# Patient Record
Sex: Male | Born: 1960
Health system: Southern US, Community
[De-identification: ages and names within clinical notes are randomized; demographics above are authoritative.]

## PROBLEM LIST (undated history)

## (undated) DIAGNOSIS — I10 Essential (primary) hypertension: Secondary | ICD-10-CM

## (undated) DIAGNOSIS — E669 Obesity, unspecified: Secondary | ICD-10-CM

## (undated) DIAGNOSIS — E119 Type 2 diabetes mellitus without complications: Secondary | ICD-10-CM

## (undated) DIAGNOSIS — K259 Gastric ulcer, unspecified as acute or chronic, without hemorrhage or perforation: Secondary | ICD-10-CM

## (undated) DIAGNOSIS — F419 Anxiety disorder, unspecified: Secondary | ICD-10-CM

## (undated) DIAGNOSIS — C801 Malignant (primary) neoplasm, unspecified: Secondary | ICD-10-CM

## (undated) DIAGNOSIS — I219 Acute myocardial infarction, unspecified: Secondary | ICD-10-CM

## (undated) DIAGNOSIS — E785 Hyperlipidemia, unspecified: Secondary | ICD-10-CM

## (undated) DIAGNOSIS — K219 Gastro-esophageal reflux disease without esophagitis: Secondary | ICD-10-CM

## (undated) HISTORY — PX: TONSILECTOMY/ADENOIDECTOMY WITH MYRINGOTOMY: SHX6125

## (undated) HISTORY — PX: JOINT REPLACEMENT: SHX530

## (undated) HISTORY — DX: Acute myocardial infarction, unspecified: I21.9

## (undated) HISTORY — DX: Anxiety disorder, unspecified: F41.9

## (undated) HISTORY — DX: Essential (primary) hypertension: I10

## (undated) HISTORY — DX: Type 2 diabetes mellitus without complications: E11.9

## (undated) HISTORY — DX: Gastric ulcer, unspecified as acute or chronic, without hemorrhage or perforation: K25.9

## (undated) HISTORY — DX: Hyperlipidemia, unspecified: E78.5

## (undated) HISTORY — DX: Obesity, unspecified: E66.9

---

## 2007-11-16 DIAGNOSIS — F419 Anxiety disorder, unspecified: Secondary | ICD-10-CM

## 2007-11-16 HISTORY — DX: Anxiety disorder, unspecified: F41.9

## 2008-11-15 HISTORY — PX: BARIATRIC SURGERY: SHX1103

## 2008-11-24 HISTORY — PX: GASTRIC BYPASS: SHX52

## 2009-11-15 DIAGNOSIS — I219 Acute myocardial infarction, unspecified: Secondary | ICD-10-CM

## 2009-11-15 HISTORY — DX: Acute myocardial infarction, unspecified: I21.9

## 2016-10-16 ENCOUNTER — Encounter: Payer: Self-pay | Admitting: Cardiology

## 2017-03-11 LAB — HM DIABETES EYE EXAM

## 2018-05-29 LAB — LIPID PANEL
Cholesterol: 113 (ref 0–200)
HDL: 25 — AB (ref 35–70)
LDL Cholesterol: 55
Triglycerides: 163 — AB (ref 40–160)

## 2018-05-29 LAB — HEMOGLOBIN A1C: Hemoglobin A1C: 9.9 % — AB (ref 4.0–5.6)

## 2018-08-22 ENCOUNTER — Other Ambulatory Visit: Payer: Self-pay

## 2018-08-22 ENCOUNTER — Emergency Department (HOSPITAL_COMMUNITY): Payer: Medicaid - Out of State

## 2018-08-22 ENCOUNTER — Ambulatory Visit: Payer: Self-pay | Admitting: Physician Assistant

## 2018-08-22 ENCOUNTER — Emergency Department (HOSPITAL_COMMUNITY)
Admission: EM | Admit: 2018-08-22 | Discharge: 2018-08-22 | Disposition: A | Discharge status: Home / Self Care | Payer: Medicaid - Out of State | Attending: Emergency Medicine | Admitting: Emergency Medicine

## 2018-08-22 ENCOUNTER — Encounter (HOSPITAL_COMMUNITY): Payer: Self-pay

## 2018-08-22 ENCOUNTER — Encounter: Payer: Self-pay | Admitting: Physician Assistant

## 2018-08-22 VITALS — BP 158/96 | HR 76 | Temp 98.2°F | Ht 72.0 in | Wt 336.2 lb

## 2018-08-22 DIAGNOSIS — E119 Type 2 diabetes mellitus without complications: Secondary | ICD-10-CM | POA: Insufficient documentation

## 2018-08-22 DIAGNOSIS — I10 Essential (primary) hypertension: Secondary | ICD-10-CM | POA: Insufficient documentation

## 2018-08-22 DIAGNOSIS — Z7984 Long term (current) use of oral hypoglycemic drugs: Secondary | ICD-10-CM | POA: Insufficient documentation

## 2018-08-22 DIAGNOSIS — K219 Gastro-esophageal reflux disease without esophagitis: Secondary | ICD-10-CM | POA: Insufficient documentation

## 2018-08-22 DIAGNOSIS — Z79899 Other long term (current) drug therapy: Secondary | ICD-10-CM | POA: Insufficient documentation

## 2018-08-22 DIAGNOSIS — R1013 Epigastric pain: Secondary | ICD-10-CM | POA: Diagnosis present

## 2018-08-22 DIAGNOSIS — E785 Hyperlipidemia, unspecified: Secondary | ICD-10-CM | POA: Insufficient documentation

## 2018-08-22 DIAGNOSIS — E669 Obesity, unspecified: Secondary | ICD-10-CM | POA: Insufficient documentation

## 2018-08-22 DIAGNOSIS — I252 Old myocardial infarction: Secondary | ICD-10-CM | POA: Insufficient documentation

## 2018-08-22 LAB — I-STAT TROPONIN, ED
TROPONIN I, POC: 0 ng/mL (ref 0.00–0.08)
Troponin i, poc: 0 ng/mL (ref 0.00–0.08)

## 2018-08-22 LAB — HEPATIC FUNCTION PANEL
ALK PHOS: 66 U/L (ref 38–126)
ALT: 13 U/L (ref 0–44)
AST: 15 U/L (ref 15–41)
Albumin: 3.6 g/dL (ref 3.5–5.0)
BILIRUBIN INDIRECT: 0.4 mg/dL (ref 0.3–0.9)
Bilirubin, Direct: 0.2 mg/dL (ref 0.0–0.2)
TOTAL PROTEIN: 7.1 g/dL (ref 6.5–8.1)
Total Bilirubin: 0.6 mg/dL (ref 0.3–1.2)

## 2018-08-22 LAB — BASIC METABOLIC PANEL
ANION GAP: 12 (ref 5–15)
BUN: 11 mg/dL (ref 6–20)
CALCIUM: 9.5 mg/dL (ref 8.9–10.3)
CO2: 26 mmol/L (ref 22–32)
CREATININE: 0.85 mg/dL (ref 0.61–1.24)
Chloride: 98 mmol/L (ref 98–111)
GFR calc non Af Amer: >60 mL/min (ref >60)
Glucose, Bld: 172 mg/dL — ABNORMAL HIGH (ref 70–99)
Potassium: 4.4 mmol/L (ref 3.5–5.1)
SODIUM: 136 mmol/L (ref 135–145)

## 2018-08-22 LAB — CBC
HCT: 47 % (ref 39.0–52.0)
Hemoglobin: 13.2 g/dL (ref 13.0–17.0)
MCH: 22 pg — ABNORMAL LOW (ref 26.0–34.0)
MCHC: 28.1 g/dL — ABNORMAL LOW (ref 30.0–36.0)
MCV: 78.5 fL — ABNORMAL LOW (ref 80.0–100.0)
NRBC: 0 % (ref 0.0–0.2)
PLATELETS: 466 10*3/uL — AB (ref 150–400)
RBC: 5.99 MIL/uL — ABNORMAL HIGH (ref 4.22–5.81)
RDW: 14.8 % (ref 11.5–15.5)
WBC: 8.1 10*3/uL (ref 4.0–10.5)

## 2018-08-22 LAB — LIPASE, BLOOD: LIPASE: 27 U/L (ref 11–51)

## 2018-08-22 MED ORDER — SUCRALFATE 1 G PO TABS: 1.0000 g | ORAL_TABLET | Freq: Three times a day (TID) | ORAL | 0 refills | Status: DC

## 2018-08-22 MED ORDER — IOHEXOL 300 MG/ML  SOLN
100.0000 mL | Freq: Once | INTRAMUSCULAR | Status: AC | PRN
  Administered 2018-08-22: 100 mL via INTRAVENOUS

## 2018-08-22 NOTE — Discharge Instructions (Addendum)
You had a CT scan of your abdomen performed today.  The CT scan showed an irregularity in your bladder that will need to be further evaluated by Urology.  Get rechecked immediately if you have any new or concerning symptoms.    You can take your pantoprazole twice daily for the next week.

## 2018-08-22 NOTE — Patient Instructions (Addendum)
[facility]: [address], [place], [place] [address]

## 2018-08-22 NOTE — ED Provider Notes (Signed)
[facility] EMERGENCY DEPARTMENT Provider Note   CSN: [id number] Arrival date & time: 08/22/18  1547     History   Chief Complaint Chief Complaint  Patient presents with  . Abdominal Pain  . Heartburn    HPI Darrell Sutton is a 57 y.o. male.  The history is provided by the patient. No language interpreter was used.  Abdominal Pain    Heartburn  Associated symptoms include abdominal pain.   Darrell Sutton is a 57 y.o. male who presents to the Emergency Department complaining of abdominal pain. Presents to the emergency department for evaluation of the gastric pain and heartburn that is been ongoing for the last several months. 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 He had lost weight and then regained weight after the procedure. Over the summer he is experienced about 35 pound weight loss and is not been attempting to lose weight. He has decreased oral intake and states that he can only eat as much as he ate just following his surgery. He has increased indigestion described as epigastric burning that radiates to the central chest. Indigestion waxes and wanes with no clear alleviating or worsening factors. He has associated nausea with occasional emesis, last episode a few days ago. No fevers, shortness of breath, diaphoresis, dysuria, diarrhea, hematochezia or melena. He recently moved to the area from [place] and was establishing with a new PCP today, who referred him to the emergency department for further evaluation. Past Medical History:  Diagnosis Date  . Anxiety 2009   started with divorce  . Diabetes mellitus without complication ([place])   . Gastric ulcer    from NSAID overuse  . Hyperlipidemia   . Hypertension   . MI (myocardial infarction) ([place]) 2011   has two stents  . Obesity    highest weight in 500's    Patient Active Problem List   Diagnosis Date Noted  .  Obesity   . Hypertension   . Hyperlipidemia   . Diabetes mellitus without complication ([place])   . Anxiety 11/16/2007    Past Surgical History:  Procedure Laterality Date  . GASTRIC BYPASS  11/24/2008  . JOINT REPLACEMENT     Bilateral knee        Home Medications    Prior to Admission medications   Medication Sig Start Date End Date Taking? Authorizing Provider  amLODipine-benazepril (LOTREL) 10-20 MG capsule Take 1 capsule by mouth daily.    [provider]  atorvastatin (LIPITOR) 20 MG tablet Take 20 mg by mouth daily.    [provider]  glimepiride (AMARYL) 2 MG tablet Take 2 mg by mouth. Take 3 tablets in the AM and 1 tablet in the PM    [provider]  hydrochlorothiazide (HYDRODIURIL) 25 MG tablet Take 25 mg by mouth daily.    [provider]  liraglutide (VICTOZA) 18 MG/3ML SOPN Inject 1.8 mg into the skin daily.    [provider]  metFORMIN (GLUCOPHAGE) 1000 MG tablet Take 1,000 mg by mouth 2 (two) times daily with a meal.    [provider]  metoprolol tartrate (LOPRESSOR) 50 MG tablet Take 75 mg by mouth daily.    [provider]  pantoprazole (PROTONIX) 40 MG tablet Take 40 mg by mouth daily.    [provider]  PARoxetine (PAXIL) 40 MG tablet Take 40 mg by mouth every morning.    [provider]  Family History Family History  Problem Relation Age of Onset  . Hyperlipidemia Mother   . Hypertension Mother   . Hyperlipidemia Father   . Hypertension Father   . Stroke Father   . Diabetes Brother   . Heart attack Brother   . Hyperlipidemia Brother   . Hypertension Brother   . Heart attack Maternal Grandmother   . Heart attack Maternal Grandfather   . Early death Maternal Grandfather   . Esophageal cancer Paternal Grandmother   . Colon cancer Paternal Grandfather     Social History Social History   Tobacco Use  . Smoking status: Never Smoker  . Smokeless tobacco: Never  Used  Substance Use Topics  . Alcohol use: Never    Frequency: Never  . Drug use: Not Currently    Types: Marijuana    Comment: Youth years     Allergies   Penicillins   Review of Systems Review of Systems  Gastrointestinal: Positive for abdominal pain and heartburn.  All other systems reviewed and are negative.    Physical Exam Updated Vital Signs BP (!) 174/91 (BP Location: Right Arm)   Pulse 71   Temp 98.2 F (36.8 C) (Oral)   Resp 16   Ht 6' (1.829 m)   Wt (!) 152.4 kg   SpO2 99%   BMI 45.57 kg/m   Physical Exam  Constitutional: He is oriented to person, place, and time. He appears well-developed and well-nourished.  HENT:  Head: Normocephalic and atraumatic.  Cardiovascular: Normal rate and regular rhythm.  No murmur heard. Pulmonary/Chest: Effort normal and breath sounds normal. No respiratory distress.  Abdominal: Soft. There is no tenderness. There is no rebound and no guarding.  Musculoskeletal: He exhibits no edema or tenderness.  Neurological: He is alert and oriented to person, place, and time.  Skin: Skin is warm and dry.  Psychiatric: He has a normal mood and affect. His behavior is normal.  Nursing note and vitals reviewed.    ED Treatments / Results  Labs (all labs ordered are listed, but only abnormal results are displayed) Labs Reviewed  BASIC METABOLIC PANEL - Abnormal; Notable for the following components:      Result Value   Glucose, Bld 172 (*)    All other components within normal limits  CBC - Abnormal; Notable for the following components:   RBC 5.99 (*)    MCV 78.5 (*)    MCH 22.0 (*)    MCHC 28.1 (*)    Platelets 466 (*)    All other components within normal limits  HEPATIC FUNCTION PANEL  LIPASE, BLOOD  I-STAT TROPONIN, ED  I-STAT TROPONIN, ED    EKG EKG Interpretation  Date/Time:  Tuesday August 22 2018 16:13:31 EDT Ventricular Rate:  79 PR Interval:  166 QRS Duration: 96 QT Interval:  380 QTC  Calculation: 435 R Axis:   55 Text Interpretation:  Normal sinus rhythm Nonspecific T wave abnormality Abnormal ECG no prior available for comparison Confirmed by Quintella Reichert [phone]) on 08/22/2018 6:42:08 PM   Radiology Dg Chest 2 View  Result Date: 08/22/2018 CLINICAL DATA:  Chest pain EXAM: CHEST - 2 VIEW COMPARISON:  None. FINDINGS: Mild right lower lobe airspace disease with blunting of the right costophrenic angle. Probable chronic scarring but no prior studies for confirmation. Left lung clear. Heart size and vascularity normal. IMPRESSION: Pleuroparenchymal density right lung base appears chronic however there are no prior studies for comparison. Otherwise negative Electronically Signed   By: Franchot Gallo  M.D.   On: 08/22/2018 17:34    Procedures Procedures (including critical care time)  Medications Ordered in ED Medications - No data to display   Initial Impression / Assessment and Plan / ED Course  I have reviewed the triage vital signs and the nursing notes.  Pertinent labs & imaging results that were available during my care of the patient were reviewed by me and considered in my medical decision making (see chart for details).     Patient with history of gastric bypass here for evaluation of progressive reflux and epigastric pain. EKG without acute ischemic changes. Patient with no significant pain in the emergency department. CT scan is negative for obstruction. There is a small calcified bladder mass on his CT scan. Discussed with patient findings of imaging and importance of G.I. as well as urology follow-up. Recommend increasing his Protonix, will start Carafate as well. Discussed home care, outpatient follow-up and return precautions.  Presentation is not consistent with ACS, PE, dissection. Final Clinical Impressions(s) / ED Diagnoses   Final diagnoses:  None    ED Discharge Orders    None       Quintella Reichert, MD 08/23/18 0004

## 2018-08-22 NOTE — Progress Notes (Signed)
Darrell Sutton is a 57 y.o. male here for a new problem.  I acted as a Education administrator for [employer], [name] Anselmo Pickler, LPN  History of Present Illness:   Chief Complaint  Patient presents with  . Abdominal Pain  . Heartburn    Abdominal Pain  This is a new problem. Episode onset: Started 2 months ago. The onset quality is gradual. The problem occurs constantly. The problem has been gradually worsening. The pain is located in the epigastric region. The pain is at a severity of 9/10. The quality of the pain is a sensation of fullness and burning. The abdominal pain does not radiate. Associated symptoms include constipation, headaches and vomiting. Pertinent negatives include no dysuria, fever, flatus or hematuria. Nothing aggravates the pain. Relieved by: tums temporarily. He has tried antacids for the symptoms. His past medical history is significant for abdominal surgery. There is no history of colon cancer, gallstones, GERD, irritable bowel syndrome or ulcerative colitis.   Had a cardiologist and endocrinologist in [place]. Has gastroenterologist for family hx of colonoscopy, started at age 7 and has had three per his report, we are requesting records.  Had a stomach ulcer 2011 from NSAID overuse.  Last HgbA1c in the 9's.  Was 500 lb prior to surgery, lowest after surgery low 300's. Has lost about 50-60 lbs over the summer without trying. Has significantly increased satiety. Has had a few episodes of dumping syndrome over the past few weeks. Feels like he is beginning to have issues over the past few months with solids. Feels faint when he walks around after about 10 minutes. Has to stop and sit down, doesn't get SOB or feel like his heart is racing, but feels very dizzy and that he is going to pass out. He states that he has had echo in the past but is was several years ago.   Past Medical History:  Diagnosis Date  . Anxiety 2009   started with divorce  . Diabetes mellitus without  complication ([place])   . Gastric ulcer    from NSAID overuse  . Hyperlipidemia   . Hypertension   . MI (myocardial infarction) ([place]) 2011   has two stents  . Obesity    highest weight in 500's     Social History   Socioeconomic History  . Marital status: Not on file    Spouse name: Not on file  . Number of children: Not on file  . Years of education: Not on file  . Highest education level: Not on file  Occupational History  . Not on file  Social Needs  . Financial resource strain: Not on file  . Food insecurity:    Worry: Not on file    Inability: Not on file  . Transportation needs:    Medical: Not on file    Non-medical: Not on file  Tobacco Use  . Smoking status: Never Smoker  . Smokeless tobacco: Never Used  Substance and Sexual Activity  . Alcohol use: Never    Frequency: Never  . Drug use: Not Currently    Types: Marijuana    Comment: Youth years  . Sexual activity: Not on file  Lifestyle  . Physical activity:    Days per week: Not on file    Minutes per session: Not on file  . Stress: Not on file  Relationships  . Social connections:    Talks on phone: Not on file    Gets together: Not on file  Attends religious service: Not on file    Active member of club or organization: Not on file    Attends meetings of clubs or organizations: Not on file    Relationship status: Not on file  . Intimate partner violence:    Fear of current or ex partner: Not on file    Emotionally abused: Not on file    Physically abused: Not on file    Forced sexual activity: Not on file  Other Topics Concern  . Not on file  Social History Narrative   From [place], moved here Sep 2019 with his wife and son   Son with autism, starting at [employer]       Past Surgical History:  Procedure Laterality Date  . GASTRIC BYPASS  11/24/2008  . JOINT REPLACEMENT     Bilateral knee    Family History  Problem Relation Age of Onset  . Hyperlipidemia Mother   . Hypertension  Mother   . Hyperlipidemia Father   . Hypertension Father   . Stroke Father   . Diabetes Brother   . Heart attack Brother   . Hyperlipidemia Brother   . Hypertension Brother   . Heart attack Maternal Grandmother   . Heart attack Maternal Grandfather   . Early death Maternal Grandfather   . Esophageal cancer Paternal Grandmother   . Colon cancer Paternal Grandfather     Allergies  Allergen Reactions  . Penicillins Hives    Current Medications:   Current Outpatient Medications:  .  amLODipine-benazepril (LOTREL) 10-20 MG capsule, Take 1 capsule by mouth daily., Disp: , Rfl:  .  atorvastatin (LIPITOR) 20 MG tablet, Take 20 mg by mouth daily., Disp: , Rfl:  .  glimepiride (AMARYL) 2 MG tablet, Take 2 mg by mouth. Take 3 tablets in the AM and 1 tablet in the PM, Disp: , Rfl:  .  hydrochlorothiazide (HYDRODIURIL) 25 MG tablet, Take 25 mg by mouth daily., Disp: , Rfl:  .  liraglutide (VICTOZA) 18 MG/3ML SOPN, Inject 1.8 mg into the skin daily., Disp: , Rfl:  .  metFORMIN (GLUCOPHAGE) 1000 MG tablet, Take 1,000 mg by mouth 2 (two) times daily with a meal., Disp: , Rfl:  .  metoprolol tartrate (LOPRESSOR) 50 MG tablet, Take 75 mg by mouth daily., Disp: , Rfl:  .  pantoprazole (PROTONIX) 40 MG tablet, Take 40 mg by mouth daily., Disp: , Rfl:  .  PARoxetine (PAXIL) 40 MG tablet, Take 40 mg by mouth every morning., Disp: , Rfl:    Review of Systems:   Review of Systems  Constitutional: Negative for fever.  Gastrointestinal: Positive for constipation and vomiting. Negative for flatus.  Genitourinary: Negative for dysuria and hematuria.  Neurological: Positive for headaches.    Vitals:   Vitals:   08/22/18 1401  BP: (!) 158/96  Pulse: 76  Temp: 98.2 F (36.8 C)  TempSrc: Oral  SpO2: 97%  Weight: (!) 336 lb 4 oz (152.5 kg)  Height: 6' (1.829 m)     Body mass index is 45.6 kg/m.  Physical Exam:   Physical Exam  Constitutional: He appears well-developed. He is cooperative.   Non-toxic appearance. He does not have a sickly appearance. He does not appear ill. No distress.  Cardiovascular: Normal rate, regular rhythm, S1 normal, S2 normal, normal heart sounds and normal pulses.  No LE edema  Pulmonary/Chest: Effort normal and breath sounds normal.  Abdominal: Normal appearance and bowel sounds are normal. There is no tenderness.  Neurological: He  is alert. GCS eye subscore is 4. GCS verbal subscore is 5. GCS motor subscore is 6.  Skin: Skin is warm, dry and intact.  Psychiatric: He has a normal mood and affect. His speech is normal and behavior is normal.  Nursing note and vitals reviewed.  EKG tracing is personally reviewed.  EKG notes NSR.  No acute changes.   Assessment and Plan:    Taegan was seen today for abdominal pain and heartburn.  Diagnoses and all orders for this visit:  Epigastric pain; Essential hypertension; Hyperlipidemia, unspecified hyperlipidemia type; Diabetes mellitus without complication ([place]) Patient is new to our office today and we have very few records to review. Concerning presentation, and states that his current symptoms are very reminiscent of his last MI, where he only had severe GERD. He also has significant dizziness after ambulating for 10 minutes on a daily basis. Discussed briefly with Dr. Briscoe Deutscher. Given symptoms, uncontrolled DM, HTN, among other risk factors, feel patient would best be served in the ER. Patient and wife agreeable to plan.  . Reviewed expectations re: course of current medical issues. . Discussed self-management of symptoms. . Outlined signs and symptoms indicating need for more acute intervention. . Patient verbalized understanding and all questions were answered. . See orders for this visit as documented in the electronic medical record. . Patient received an After-Visit Summary.  CMA or LPN served as scribe during this visit. History, Physical, and Plan performed by medical provider. The above  documentation has been reviewed and is accurate and complete.  Patient has a HIGH level of medical complexity due to number of diagnoses/treatment options and amount/complexity of data reviewed.   [name], [name]

## 2018-08-22 NOTE — ED Triage Notes (Addendum)
Pt endorses epigastric pain/heart burn x 2 months intermittently with n/v. Pt has hx of MI and 2 stents, and bleeding ulcer. Feels faint with exertion. Just moved to this area. Sent by new pcp. Axox[facility].

## 2018-08-24 ENCOUNTER — Encounter: Payer: Self-pay | Admitting: Physician Assistant

## 2018-08-24 ENCOUNTER — Ambulatory Visit: Payer: Self-pay | Admitting: Physician Assistant

## 2018-08-24 VITALS — BP 130/80 | HR 76 | Temp 98.4°F | Ht 72.0 in | Wt 338.2 lb

## 2018-08-24 DIAGNOSIS — K219 Gastro-esophageal reflux disease without esophagitis: Secondary | ICD-10-CM

## 2018-08-24 DIAGNOSIS — K449 Diaphragmatic hernia without obstruction or gangrene: Secondary | ICD-10-CM

## 2018-08-24 DIAGNOSIS — Z23 Encounter for immunization: Secondary | ICD-10-CM

## 2018-08-24 DIAGNOSIS — I252 Old myocardial infarction: Secondary | ICD-10-CM | POA: Insufficient documentation

## 2018-08-24 DIAGNOSIS — E119 Type 2 diabetes mellitus without complications: Secondary | ICD-10-CM

## 2018-08-24 DIAGNOSIS — N3289 Other specified disorders of bladder: Secondary | ICD-10-CM | POA: Insufficient documentation

## 2018-08-24 DIAGNOSIS — R42 Dizziness and giddiness: Secondary | ICD-10-CM

## 2018-08-24 LAB — POCT GLYCOSYLATED HEMOGLOBIN (HGB A1C): HEMOGLOBIN A1C: 9.5 % — AB (ref 4.0–5.6)

## 2018-08-24 MED ORDER — EMPAGLIFLOZIN 10 MG PO TABS: 10.0000 mg | ORAL_TABLET | Freq: Every day | ORAL | 0 refills | Status: DC

## 2018-08-24 MED ORDER — EMPAGLIFLOZIN 10 MG PO TABS: 10.0000 mg | ORAL_TABLET | Freq: Every day | ORAL | 2 refills | Status: DC

## 2018-08-24 NOTE — Progress Notes (Signed)
Darrell Sutton is a 57 y.o. male here to Establish Care and ED follow up for GERD.  I acted as a Education administrator for [employer], [name] Anselmo Pickler, LPN  History of Present Illness:   Chief Complaint  Patient presents with  . Establish Care  . Follow-up   Went to the ER on 08/22/18 and was diagnosed with GERD. CT scan of abdomen showed hiatal hernia and "probable small calcified mass in bladder." He was started on Carafate and is tolerating well. Feels like his symptoms are "60%" improved. Denies prior history of urological issues, has never seen urologist except for vasectomy.  He does continue to have dizziness and feelings of pre-syncope after walking for about 10 min. Last echo was about 1.5 years ago per his report. States that it was normal? Requesting records today. Has significant hx of prior MI.  Diabetes -- states that his last HgbA1c was in the 9's. Requesting records today. Saw an endocrinologist about 2 years ago and was put on current regimen of: amaryl 6mg  in AM and 2mg  in PM, victoza 1.8 mg daily, metformin 1000 mg BID. Was on Januvia at one point but is no longer on it, doesn't know why he was taken off of it.  Health Maintenance: Immunizations -- will wait till pt gets insurance next month Colonoscopy -- UTD, will get records Bone Density -- N/A Weight -- Weight: (!) 338 lb 4 oz (153.4 kg)   Depression screen PHQ 2/9 08/22/2018  Decreased Interest 0  Down, Depressed, Hopeless 0  PHQ - 2 Score 0    No flowsheet data found.    Past Medical History:  Diagnosis Date  . Anxiety 2009   started with divorce  . Diabetes mellitus without complication ([place])   . Gastric ulcer    from NSAID overuse  . Hyperlipidemia   . Hypertension   . MI (myocardial infarction) ([place]) 2011   has two stents  . Obesity    highest weight in 500's     Social History   Socioeconomic History  . Marital status: Married    Spouse name: Not on file  . Number of children: Not on file   . Years of education: Not on file  . Highest education level: Not on file  Occupational History  . Not on file  Social Needs  . Financial resource strain: Not on file  . Food insecurity:    Worry: Not on file    Inability: Not on file  . Transportation needs:    Medical: Not on file    Non-medical: Not on file  Tobacco Use  . Smoking status: Never Smoker  . Smokeless tobacco: Never Used  Substance and Sexual Activity  . Alcohol use: Never    Frequency: Never  . Drug use: Not Currently    Types: Marijuana    Comment: Youth years  . Sexual activity: Not on file  Lifestyle  . Physical activity:    Days per week: Not on file    Minutes per session: Not on file  . Stress: Not on file  Relationships  . Social connections:    Talks on phone: Not on file    Gets together: Not on file    Attends religious service: Not on file    Active member of club or organization: Not on file    Attends meetings of clubs or organizations: Not on file    Relationship status: Not on file  . Intimate partner violence:  Fear of current or ex partner: Not on file    Emotionally abused: Not on file    Physically abused: Not on file    Forced sexual activity: Not on file  Other Topics Concern  . Not on file  Social History Narrative   From [place], moved here Sep 2019 with his wife and son   Son with autism, starting at [employer]       Past Surgical History:  Procedure Laterality Date  . GASTRIC BYPASS  11/24/2008  . JOINT REPLACEMENT     Bilateral knee    Family History  Problem Relation Age of Onset  . Hyperlipidemia Mother   . Hypertension Mother   . Hyperlipidemia Father   . Hypertension Father   . Stroke Father   . Diabetes Brother   . Heart attack Brother   . Hyperlipidemia Brother   . Hypertension Brother   . Heart attack Maternal Grandmother   . Heart attack Maternal Grandfather   . Early death Maternal Grandfather   . Esophageal cancer Paternal Grandmother   .  Colon cancer Paternal Grandfather     Allergies  Allergen Reactions  . Penicillins Hives    Has patient had a PCN reaction causing immediate rash, facial/tongue/throat swelling, SOB or lightheadedness with hypotension: Yes Has patient had a PCN reaction causing severe rash involving mucus membranes or skin necrosis: No Has patient had a PCN reaction that required hospitalization: No; was already in hosp Has patient had a PCN reaction occurring within the last 10 years: No If all of the above answers are "NO", then may proceed with Cephalosporin use.      Current Medications:   Current Outpatient Medications:  .  albuterol (PROAIR HFA) 108 (90 Base) MCG/ACT inhaler, Inhale 2 puffs into the lungs every 6 (six) hours as needed for wheezing or shortness of breath., Disp: , Rfl:  .  albuterol (PROVENTIL) (2.5 MG/3ML) 0.083% nebulizer solution, Take 2.5 mg by nebulization every 6 (six) hours as needed for wheezing or shortness of breath., Disp: , Rfl:  .  amLODipine-benazepril (LOTREL) 10-20 MG capsule, Take 1 capsule by mouth daily., Disp: , Rfl:  .  aspirin EC 81 MG tablet, Take 81 mg by mouth daily., Disp: , Rfl:  .  atorvastatin (LIPITOR) 20 MG tablet, Take 20 mg by mouth daily., Disp: , Rfl:  .  CALCIUM CITRATE PO, Take 1 tablet by mouth daily., Disp: , Rfl:  .  Cyanocobalamin (VITAMIN B-12 PO), Take 1 tablet by mouth daily., Disp: , Rfl:  .  glimepiride (AMARYL) 2 MG tablet, Take 2-6 mg by mouth See admin instructions. Take 6 mg by mouth in the morning and 2 mg in late evening, Disp: , Rfl:  .  hydrochlorothiazide (HYDRODIURIL) 25 MG tablet, Take 25 mg by mouth daily., Disp: , Rfl:  .  liraglutide (VICTOZA) 18 MG/3ML SOPN, Inject 1.8 mg into the skin daily., Disp: , Rfl:  .  metFORMIN (GLUCOPHAGE) 1000 MG tablet, Take 1,000 mg by mouth 2 (two) times daily with a meal., Disp: , Rfl:  .  metoprolol tartrate (LOPRESSOR) 50 MG tablet, Take 75 mg by mouth daily., Disp: , Rfl:  .  Multiple  Vitamins-Minerals (CENTRUM MEN) TABS, Take 1 tablet by mouth daily., Disp: , Rfl:  .  pantoprazole (PROTONIX) 40 MG tablet, Take 40 mg by mouth daily., Disp: , Rfl:  .  PARoxetine (PAXIL) 40 MG tablet, Take 40 mg by mouth every morning., Disp: , Rfl:  .  sucralfate (CARAFATE) 1 g tablet, Take 1 tablet (1 g total) by mouth 4 (four) times daily -  with meals and at bedtime., Disp: 60 tablet, Rfl: 0 .  empagliflozin (JARDIANCE) 10 MG TABS tablet, Take 10 mg by mouth daily., Disp: 30 tablet, Rfl: 2   Review of Systems:   ROS  Negative unless otherwise specified per HPI.  Vitals:   Vitals:   08/24/18 0958  BP: 130/80  Pulse: 76  Temp: 98.4 F (36.9 C)  TempSrc: Oral  SpO2: 97%  Weight: (!) 338 lb 4 oz (153.4 kg)  Height: 6' (1.829 m)     Body mass index is 45.87 kg/m.  Physical Exam:   Physical Exam  Constitutional: He appears well-developed. He is cooperative.  Non-toxic appearance. He does not have a sickly appearance. He does not appear ill. No distress.  Cardiovascular: Normal rate, regular rhythm, S1 normal, S2 normal, normal heart sounds and normal pulses.  No LE edema  Pulmonary/Chest: Effort normal and breath sounds normal.  Neurological: He is alert. GCS eye subscore is 4. GCS verbal subscore is 5. GCS motor subscore is 6.  Skin: Skin is warm, dry and intact.  Psychiatric: He has a normal mood and affect. His speech is normal and behavior is normal.  Nursing note and vitals reviewed.  Lab Results  Component Value Date   HGBA1C 9.5 (A) 08/24/2018    Assessment and Plan:    Donley was seen today for establish care and follow-up.  Diagnoses and all orders for this visit:  Diabetes mellitus without complication ([place]) [id number] is uncontrolled, 9.5. Continue Victoza, Amaryl, Metformin. Today we will add Jardiance 10 mg tablets. Follow-up in 3 months. He also received glucometer today to hopefully help improve compliance with medications and more accurate assessment of  DM. -     POCT glycosylated hemoglobin (Hb A1C)  Gastroesophageal reflux disease, esophagitis presence not specified; Hiatal hernia Improved, but would like to see GI for further evaluation, especially since he has history of "bleeding ulcer."  Dizziness; History of MI (myocardial infarction) EKG was normal at last visit. Given that he is getting dizzy consistently after 10 minutes of activity, I want him to see cardiology for work-up especially with hx of MI. Do not over exert until cleared by cardiology.  Mass of bladder New. Needs further work-up from urology to r/o neoplasm. Urgent referral placed.  Need for prophylactic vaccination and inoculation against influenza -     Flu Vaccine QUAD 36+ mos IM  Other orders -     empagliflozin (JARDIANCE) 10 MG TABS tablet; Take 10 mg by mouth daily.  . Reviewed expectations re: course of current medical issues. . Discussed self-management of symptoms. . Outlined signs and symptoms indicating need for more acute intervention. . Patient verbalized understanding and all questions were answered. . See orders for this visit as documented in the electronic medical record. . Patient received an After-Visit Summary.   CMA or LPN served as scribe during this visit. History, Physical, and Plan performed by medical provider. The above documentation has been reviewed and is accurate and complete.  [name], [name]

## 2018-08-24 NOTE — Patient Instructions (Signed)
It was great to see you!  Continue current DM regimen but lets start Jardiance 10 mg daily in the meantime. I am going to send this in for you.   Priority right now is getting your referrals completed -- GI, urology and cardiology.  Let's follow-up in 3 months, sooner if you have concerns.  Take care,  [name] [name]

## 2018-08-29 ENCOUNTER — Encounter: Payer: Self-pay | Admitting: Physician Assistant

## 2018-08-30 NOTE — Progress Notes (Addendum)
Cardiology Office Note   Date:  08/31/2018   ID:  Darrell Sutton, DOB 03-10-1961, MRN [id number]  PCP:  [name], PA  Cardiologist:   No primary care provider on file. Referring:  Self  Chief Complaint  Patient presents with  . Coronary Artery Disease      History of Present Illness: Darrell Sutton is a 57 y.o. male who is self referred for evaluation of CAD.  The patient has moved here from [place].  He has a history of coronary artery disease with a stent to his LAD in 1 to his RCA in 2011.  I do see a card indicating a Multi-link stent in the RCA but I don't know about the LAD.  I don't have any records.  He reports having a stress perfusion study about 18 months ago that was normal.  He does not require another catheterization.  He apparently had a heart attack at the time of his presentation in 2011.  He recently was in the emergency room because he had heartburn or chest pain that was felt similar to previous.  His enzymes were negative.  EKG was nonacute.  I have reviewed these records.  He was eventually found to have a hiatal hernia.  He was started on Carafate and his Prilosec was increased.  He is had no new symptoms since then but is only been a few days.  He denies any neck or arm discomfort.  Is not having any palpitations.  His biggest complaint now is dizziness.  This seems to happen sporadically.  He is not feeling palpitations.  He denies any orthostatic symptoms.  He does not see things spinning.  He thinks perhaps his blood sugar might be falling.  Of note he has lost about 30 pounds because he is had problems with his stomach and he now feels that this is reflux.    Past Medical History:  Diagnosis Date  . Anxiety 2009   started with divorce  . Diabetes mellitus without complication ([place])   . Gastric ulcer    from NSAID overuse  . Hyperlipidemia   . Hypertension   . MI (myocardial infarction) ([place]) 2011   has two stents  . Obesity    highest  weight in 500's    Past Surgical History:  Procedure Laterality Date  . GASTRIC BYPASS  11/24/2008  . JOINT REPLACEMENT     Bilateral knee  . TONSILECTOMY/ADENOIDECTOMY WITH MYRINGOTOMY       Current Outpatient Medications  Medication Sig Dispense Refill  . albuterol (PROAIR HFA) 108 (90 Base) MCG/ACT inhaler Inhale 2 puffs into the lungs every 6 (six) hours as needed for wheezing or shortness of breath.    Marland Kitchen albuterol (PROVENTIL) (2.5 MG/3ML) 0.083% nebulizer solution Take 2.5 mg by nebulization every 6 (six) hours as needed for wheezing or shortness of breath.    Marland Kitchen amLODipine-benazepril (LOTREL) 10-20 MG capsule Take 1 capsule by mouth daily.    Marland Kitchen aspirin EC 81 MG tablet Take 81 mg by mouth daily.    Marland Kitchen atorvastatin (LIPITOR) 20 MG tablet Take 20 mg by mouth daily.    Marland Kitchen CALCIUM CITRATE PO Take 1 tablet by mouth daily.    . Cyanocobalamin (VITAMIN B-12 PO) Take 1 tablet by mouth daily.    . empagliflozin (JARDIANCE) 10 MG TABS tablet Take 10 mg by mouth daily. 30 tablet 2  . glimepiride (AMARYL) 2 MG tablet Take 2-6 mg by mouth See admin instructions. Take 6  mg by mouth in the morning and 2 mg in late evening    . hydrochlorothiazide (HYDRODIURIL) 25 MG tablet Take 25 mg by mouth daily.    Marland Kitchen liraglutide (VICTOZA) 18 MG/3ML SOPN Inject 1.8 mg into the skin daily.    . metFORMIN (GLUCOPHAGE) 1000 MG tablet Take 1,000 mg by mouth 2 (two) times daily with a meal.    . metoprolol tartrate (LOPRESSOR) 50 MG tablet Take 75 mg by mouth daily.    . Multiple Vitamins-Minerals (CENTRUM MEN) TABS Take 1 tablet by mouth daily.    . pantoprazole (PROTONIX) 40 MG tablet Take 40 mg by mouth daily.    Marland Kitchen PARoxetine (PAXIL) 40 MG tablet Take 40 mg by mouth every morning.    . sucralfate (CARAFATE) 1 g tablet Take 1 tablet (1 g total) by mouth 4 (four) times daily -  with meals and at bedtime. 60 tablet 0   No current facility-administered medications for this visit.     Allergies:   Penicillins     Social History:  The patient  reports that he has never smoked. He has never used smokeless tobacco. He reports that he has current or past drug history. Drug: Marijuana. He reports that he does not drink alcohol.   Family History:  The patient's family history includes Colon cancer in his paternal grandfather; Diabetes in his brother; Early death in his maternal grandfather; Esophageal cancer in his paternal grandmother; Heart attack in his maternal grandfather and maternal grandmother; Heart attack (age of onset: 68) in his brother; Hyperlipidemia in his brother, father, and mother; Hypertension in his brother, father, and mother; Stroke in his father.    ROS:  Please see the history of present illness.   Otherwise, review of systems are positive for none.   All other systems are reviewed and negative.    PHYSICAL EXAM: VS:  BP 133/77   Pulse 71   Ht 6\' 1"  (1.854 m)   Wt (!) 332 lb (150.6 kg)   SpO2 96%   BMI 43.80 kg/m  , BMI Body mass index is 43.8 kg/m. GENERAL:  Well appearing HEENT:  Pupils equal round and reactive, fundi not visualized, oral mucosa unremarkable NECK:  No jugular venous distention, waveform within normal limits, carotid upstroke brisk and symmetric, no bruits, no thyromegaly LYMPHATICS:  No cervical, inguinal adenopathy LUNGS:  Clear to auscultation bilaterally BACK:  No CVA tenderness CHEST:  Unremarkable HEART:  PMI not displaced or sustained,S1 and S2 within normal limits, no S3, no S4, no clicks, no rubs, no murmurs ABD:  Flat, positive bowel sounds normal in frequency in pitch, no bruits, no rebound, no guarding, no midline pulsatile mass, no hepatomegaly, no splenomegaly EXT:  2 plus pulses throughout, no edema, no cyanosis no clubbing SKIN:  No rashes no nodules NEURO:  Cranial nerves II through XII grossly intact, motor grossly intact throughout PSYCH:  Cognitively intact, oriented to person place and time    EKG:  EKG is not ordered today. The  ekg ordered 08/22/18 demonstrates sinus rhythm, rate 79, axis within normal limits, intervals within normal limits, no acute ST-T wave changes   Recent Labs: 08/22/2018: ALT 13; BUN 11; Creatinine, Ser 0.85; Hemoglobin 13.2; Platelets 466; Potassium 4.4; Sodium 136    Lipid Panel    Component Value Date/Time   CHOL 113 05/29/2018   TRIG 163 (A) 05/29/2018   HDL 25 (A) 05/29/2018   LDLCALC 55 05/29/2018      Wt Readings from Last  3 Encounters:  08/31/18 (!) 332 lb (150.6 kg)  08/24/18 (!) 338 lb 4 oz (153.4 kg)  08/22/18 (!) 336 lb (152.4 kg)      Other studies Reviewed: Additional studies/ records that were reviewed today include: ED records. Review of the above records demonstrates:  Please see elsewhere in the note.     ASSESSMENT AND PLAN:  CAD:   Patient has no symptoms consistent with angina.  I will get his old records.  He reports having a stress perfusion study 18 months ago.  We will continue with risk reduction.  HTN:   The blood pressure is at target. No change in medications is indicated. We will continue with therapeutic lifestyle changes (TLC).  DYSLIPIDEMIA:    LDL is 55.  HDL was low.  He will continue the meds as listed.  DM:  A1C was 9.5.  We talked about this.  He is now following with his primary provider and this should be coming down.  OBESITY: His weight is coming down.  He continues to work on this.  DIZZINESS: The etiology of this is not clear.  However, I do not think is an arrhythmia.  He is not orthostatic.  I do not see a cardiac etiology.  No further cardiac work-up is suggested.  I have asked him to check his blood sugar when he is having these sensations.   Current medicines are reviewed at length with the patient today.  The patient does not have concerns regarding medicines.  The following changes have been made:  no change  Labs/ tests ordered today include: None No orders of the defined types were placed in this  encounter.    Disposition:   FU with me in 4 months.     Signed, [name], MD  08/31/2018 5:40 PM    [facility]    Addendum  I was able to review records from the outside hospital.  In 2011 he had an inferior myocardial infarction.  Cardiac cath demonstrated 90% RCA with thrombus and an incidental 80% stenosis.  He did get PMK.  He underwent thrombectomy and placement of a bare metal stent to the right coronary artery.  He EF was 50%.  He had staged PCI with a Xience stent to the LAD.  Under

## 2018-08-31 ENCOUNTER — Ambulatory Visit (INDEPENDENT_AMBULATORY_CARE_PROVIDER_SITE_OTHER): Payer: Self-pay | Admitting: Cardiology

## 2018-08-31 ENCOUNTER — Encounter: Payer: Self-pay | Admitting: Gastroenterology

## 2018-08-31 ENCOUNTER — Encounter: Payer: Self-pay | Admitting: Cardiology

## 2018-08-31 VITALS — BP 133/77 | HR 71 | Ht 73.0 in | Wt 332.0 lb

## 2018-08-31 DIAGNOSIS — I10 Essential (primary) hypertension: Secondary | ICD-10-CM

## 2018-08-31 DIAGNOSIS — R42 Dizziness and giddiness: Secondary | ICD-10-CM | POA: Insufficient documentation

## 2018-08-31 DIAGNOSIS — E118 Type 2 diabetes mellitus with unspecified complications: Secondary | ICD-10-CM

## 2018-08-31 DIAGNOSIS — I2510 Atherosclerotic heart disease of native coronary artery without angina pectoris: Secondary | ICD-10-CM | POA: Insufficient documentation

## 2018-08-31 NOTE — Patient Instructions (Signed)
Medication Instructions:  Continue current medications  If you need a refill on your cardiac medications before your next appointment, please call your pharmacy.  Labwork: None Ordered   If you have labs (blood work) drawn today and your tests are completely normal, you will receive your results only by: Marland Kitchen MyChart Message (if you have MyChart) OR . A paper copy in the mail If you have any lab test that is abnormal or we need to change your treatment, we will call you to review the results.  Testing/Procedures: None Ordered  Follow-Up: . You will need a follow up appointment in 4 Months.    At [facility], you and your health needs are our priority.  As part of our continuing mission to provide you with exceptional heart care, we have created designated Provider Care Teams.  These Care Teams include your primary Cardiologist (physician) and Advanced Practice Providers (APPs -  Physician Assistants and Nurse Practitioners) who all work together to provide you with the care you need, when you need it.    Thank you for choosing [facility] at [facility]!!

## 2018-09-08 ENCOUNTER — Encounter: Payer: Self-pay | Admitting: Physician Assistant

## 2018-09-11 MED ORDER — ATORVASTATIN CALCIUM 20 MG PO TABS: 20.0000 mg | ORAL_TABLET | Freq: Every day | ORAL | 3 refills | Status: DC

## 2018-09-19 ENCOUNTER — Encounter: Payer: Self-pay | Admitting: Gastroenterology

## 2018-09-19 ENCOUNTER — Ambulatory Visit: Payer: Self-pay | Admitting: Gastroenterology

## 2018-09-19 VITALS — BP 130/90 | HR 80 | Ht 72.0 in | Wt 320.0 lb

## 2018-09-19 DIAGNOSIS — E119 Type 2 diabetes mellitus without complications: Secondary | ICD-10-CM

## 2018-09-19 DIAGNOSIS — R1319 Other dysphagia: Secondary | ICD-10-CM

## 2018-09-19 DIAGNOSIS — K219 Gastro-esophageal reflux disease without esophagitis: Secondary | ICD-10-CM

## 2018-09-19 DIAGNOSIS — R1310 Dysphagia, unspecified: Secondary | ICD-10-CM

## 2018-09-19 DIAGNOSIS — R634 Abnormal weight loss: Secondary | ICD-10-CM

## 2018-09-19 DIAGNOSIS — E669 Obesity, unspecified: Secondary | ICD-10-CM

## 2018-09-19 NOTE — Progress Notes (Signed)
[facility] Consult Note:  History: Darrell Sutton 09/19/2018  Referring physician: Inda Sutton, [place]  Reason for consult/chief complaint: Gastroesophageal Reflux (Severe reflux and heartburn, chest pain, constantly; vomiting 2-3 times daily, gagging on all meals. patient also has been diagnosed with a hiatal hernia.) and Dysphagia (Patient regurgitates food and liquids, paternal grandmother had esophageal cancer)   Subjective  HPI:  This is a 57 year old man referred by primary care and ED physician for several months of steadily worsening upper digestive symptoms.  He reports solid and liquid dysphagia, incessant heartburn, vomiting 2-3 times a day and a nearly 50 pound weight loss in the last few months.  He had previously had intermittent heartburn and dysphagia ever since gastric bypass surgery, but never anything like this and it is all getting steadily worse.  Provider note from 08/22/2018 ED visit was reviewed, when patient was seen there for upper abdominal/chest pain and severe regurgitation with heartburn.  He had been referred there after a new primary care appointment earlier that day out of concern for possible cardiac cause of symptoms. EKG showed no acute ischemic changes by report.  CT scan was negative for bowel obstruction, incidental small calcified bladder mass seen and urology follow-up recommended.  He underwent gastric bypass in 2010.  A year or 2 after that he apparently had an ulcer with bleeding, and said he underwent upper endoscopy with clip placement.  He attributes that ulcer to using NSAIDs at that time prior to his eventual bilateral knee replacements. He and his wife are especially concerned because a history of esophageal cancer in Darrell Sutton's grandmother.  Marya Sutton does not recall ever having been told he had Barrett's esophagus.  ROS:  Review of Systems  Constitutional: Positive for appetite change and unexpected weight change.  HENT:  Negative for mouth sores and voice change.   Eyes: Negative for pain and redness.  Respiratory: Negative for cough and shortness of breath.   Cardiovascular: Negative for chest pain and palpitations.  Genitourinary: Negative for dysuria and hematuria.  Musculoskeletal: Positive for arthralgias. Negative for myalgias.  Skin: Negative for pallor and rash.  Neurological: Negative for weakness and headaches.  Hematological: Negative for adenopathy.     Past Medical History: Past Medical History:  Diagnosis Date  . Anxiety 2009   started with divorce  . Diabetes mellitus without complication ([place])   . Gastric ulcer    from NSAID overuse  . Hyperlipidemia   . Hypertension   . MI (myocardial infarction) ([place]) 2011   has two stents  . Obesity    highest weight in 500's     Past Surgical History: Past Surgical History:  Procedure Laterality Date  . [place] SURGERY  2010  . GASTRIC BYPASS  11/24/2008  . JOINT REPLACEMENT     Bilateral knee  . TONSILECTOMY/ADENOIDECTOMY WITH MYRINGOTOMY       Family History: Family History  Problem Relation Age of Onset  . Hyperlipidemia Mother   . Hypertension Mother   . Hyperlipidemia Father   . Hypertension Father   . Stroke Father   . Diabetes Brother   . Heart attack Brother 13  . Hyperlipidemia Brother   . Hypertension Brother   . Heart attack Maternal Grandmother   . Heart attack Maternal Grandfather   . Early death Maternal Grandfather   . Esophageal cancer Paternal Grandmother   . Colon cancer Paternal Grandfather     Social History: Social History   Socioeconomic History  . Marital status: Married  Spouse name: Not on file  . Number of children: 3  . Years of education: Not on file  . Highest education level: Not on file  Occupational History  . Not on file  Social Needs  . Financial resource strain: Not on file  . Food insecurity:    Worry: Not on file    Inability: Not on file  . Transportation needs:     Medical: Not on file    Non-medical: Not on file  Tobacco Use  . Smoking status: Never Smoker  . Smokeless tobacco: Never Used  Substance and Sexual Activity  . Alcohol use: Never    Frequency: Never  . Drug use: Not Currently    Types: Marijuana    Comment: Youth years  . Sexual activity: Not on file  Lifestyle  . Physical activity:    Days per week: Not on file    Minutes per session: Not on file  . Stress: Not on file  Relationships  . Social connections:    Talks on phone: Not on file    Gets together: Not on file    Attends religious service: Not on file    Active member of club or organization: Not on file    Attends meetings of clubs or organizations: Not on file    Relationship status: Not on file  Other Topics Concern  . Not on file  Social History Narrative   From [place], moved here Sep 2019 with his wife and son   Son with autism, starting at [employer]       Allergies: Allergies  Allergen Reactions  . Penicillins Hives    Has patient had a PCN reaction causing immediate rash, facial/tongue/throat swelling, SOB or lightheadedness with hypotension: Yes Has patient had a PCN reaction causing severe rash involving mucus membranes or skin necrosis: No Has patient had a PCN reaction that required hospitalization: No; was already in hosp Has patient had a PCN reaction occurring within the last 10 years: No If all of the above answers are "NO", then may proceed with Cephalosporin use.    He moved here several months ago from [place], and is planning to start a new job at the end of the week.  Outpatient Meds: Current Outpatient Medications  Medication Sig Dispense Refill  . albuterol (PROAIR HFA) 108 (90 Base) MCG/ACT inhaler Inhale 2 puffs into the lungs every 6 (six) hours as needed for wheezing or shortness of breath.    Marland Kitchen albuterol (PROVENTIL) (2.5 MG/3ML) 0.083% nebulizer solution Take 2.5 mg by nebulization every 6 (six) hours as needed for wheezing or  shortness of breath.    Marland Kitchen amLODipine-benazepril (LOTREL) 10-20 MG capsule Take 1 capsule by mouth daily.    Marland Kitchen aspirin EC 81 MG tablet Take 81 mg by mouth daily.    Marland Kitchen atorvastatin (LIPITOR) 20 MG tablet Take 1 tablet (20 mg total) by mouth daily. 90 tablet 3  . CALCIUM CITRATE PO Take 1 tablet by mouth daily.    . Cyanocobalamin (VITAMIN B-12 PO) Take 1 tablet by mouth daily.    . empagliflozin (JARDIANCE) 10 MG TABS tablet Take 10 mg by mouth daily. 30 tablet 2  . glimepiride (AMARYL) 2 MG tablet Take 2-6 mg by mouth See admin instructions. Take 6 mg by mouth in the morning and 2 mg in late evening    . hydrochlorothiazide (HYDRODIURIL) 25 MG tablet Take 25 mg by mouth daily.    Marland Kitchen liraglutide (VICTOZA) 18 MG/3ML SOPN Inject  1.8 mg into the skin daily.    . metFORMIN (GLUCOPHAGE) 1000 MG tablet Take 1,000 mg by mouth 2 (two) times daily with a meal.    . metoprolol tartrate (LOPRESSOR) 50 MG tablet Take 75 mg by mouth daily.    . Multiple Vitamins-Minerals (CENTRUM MEN) TABS Take 1 tablet by mouth daily.    . pantoprazole (PROTONIX) 40 MG tablet Take 40 mg by mouth daily.    Marland Kitchen PARoxetine (PAXIL) 40 MG tablet Take 40 mg by mouth every morning.    . sucralfate (CARAFATE) 1 g tablet Take 1 tablet (1 g total) by mouth 4 (four) times daily -  with meals and at bedtime. 60 tablet 0   No current facility-administered medications for this visit.       ___________________________________________________________________ Objective   Exam:  BP 130/90   Pulse 80   Ht 6' (1.829 m)   Wt (!) 320 lb (145.2 kg)   BMI 43.40 kg/m    General: this is a(n) well-appearing man, pleasant and conversational, normal vocal quality  Eyes: sclera anicteric, no redness  ENT: oral mucosa moist without lesions, no cervical or supraclavicular lymphadenopathy, fair dentition  CV: RRR without murmur, S1/S2, no JVD, no peripheral edema  Resp: clear to auscultation bilaterally, normal RR and effort noted  GI:  soft, no tenderness, with active bowel sounds. No guarding or palpable organomegaly noted.  Skin; warm and dry, no rash or jaundice noted  Neuro: awake, alert and oriented x 3. Normal gross motor function and fluent speech  Labs:  CBC Latest Ref Rng & Units 08/22/2018  WBC 4.0 - 10.5 K/uL 8.1  Hemoglobin 13.0 - 17.0 g/dL 13.2  Hematocrit 39.0 - 52.0 % 47.0  Platelets 150 - 400 K/uL 466(H)   CMP Latest Ref Rng & Units 08/22/2018  Glucose 70 - 99 mg/dL 172(H)  BUN 6 - 20 mg/dL 11  Creatinine 0.61 - 1.24 mg/dL 0.85  Sodium 135 - 145 mmol/L 136  Potassium 3.5 - 5.1 mmol/L 4.4  Chloride 98 - 111 mmol/L 98  CO2 22 - 32 mmol/L 26  Calcium 8.9 - 10.3 mg/dL 9.5  Total Protein 6.5 - 8.1 g/dL 7.1  Total Bilirubin 0.3 - 1.2 mg/dL 0.6  Alkaline Phos 38 - 126 U/L 66  AST 15 - 41 U/L 15  ALT 0 - 44 U/L 13     Radiologic Studies:  CLINICAL DATA:  Weight loss, vomiting.   EXAM: CT ABDOMEN AND PELVIS WITH CONTRAST   TECHNIQUE: Multidetector CT imaging of the abdomen and pelvis was performed using the standard protocol following bolus administration of intravenous contrast.   CONTRAST:  118mL OMNIPAQUE IOHEXOL 300 MG/ML  SOLN   COMPARISON:  None.   FINDINGS: Lower chest: Probable scarring is noted in posterior portion of right lung base. Small sliding-type hiatal hernia is noted.   Hepatobiliary: No focal liver abnormality is seen. No gallstones, gallbladder wall thickening, or biliary dilatation.   Pancreas: Unremarkable. No pancreatic ductal dilatation or surrounding inflammatory changes.   Spleen: Normal in size without focal abnormality.   Adrenals/Urinary Tract: Adrenal glands appear normal. Right renal cyst is noted. No hydronephrosis or renal obstruction is noted. No renal or ureteral calculi are noted. Possible small calcified mass is seen posteriorly in urinary bladder.   Stomach/Bowel: Status post gastric bypass. Appendix appears normal. No evidence of bowel wall  thickening, distention, or inflammatory changes.   Vascular/Lymphatic: Aortic atherosclerosis. No enlarged abdominal or pelvic lymph nodes.   Reproductive: Prostate  is unremarkable.   Other: No abdominal wall hernia or abnormality. No abdominopelvic ascites.   Musculoskeletal: No acute or significant osseous findings.   IMPRESSION: Small sliding-type hiatal hernia.   Probable small calcified mass seen along posterior wall urinary bladder; cystoscopy is recommended to rule out neoplasm.   Status post gastric bypass.   Aortic Atherosclerosis (ICD10-I70.0).     Electronically Signed   By: Marijo Conception, M.D.   On: 08/22/2018 21:42   Assessment: Encounter Diagnoses  Name Primary?  . Esophageal dysphagia Yes  . Abnormal loss of weight   . Gastroesophageal reflux disease, esophagitis presence not specified   . Diabetes mellitus without complication ([place])   . Obesity, unspecified classification, unspecified obesity type, unspecified whether serious comorbidity present     Severe symptoms steadily worsening, anastomotic stricture seems most likely.  Weight loss certainly raises concern for malignancy.  Plan:  Upper endoscopy with possible dilation tomorrow.  He was agreeable after a thorough discussion of procedure and risks.  The benefits and risks of the planned procedure were described in detail with the patient or (when appropriate) their health care proxy.  Risks were outlined as including, but not limited to, bleeding, infection, perforation, adverse medication reaction leading to cardiac or pulmonary decompensation, or pancreatitis (if ERCP).  The limitation of incomplete mucosal visualization was also discussed.  No guarantees or warranties were given.  Patient at increased risk for cardiopulmonary complications of procedure due to medical comorbidities.  Thank you for the courtesy of this consult.  Please call me with any questions or concerns.  Nelida Meuse  III  CC: Darrell Sutton, [place]

## 2018-09-19 NOTE — Patient Instructions (Signed)
If you are age 57 or older, your body mass index should be between 23-30. Your Body mass index is 43.4 kg/m. If this is out of the aforementioned range listed, please consider follow up with your Primary Care Provider.  If you are age 66 or younger, your body mass index should be between 19-25. Your Body mass index is 43.4 kg/m. If this is out of the aformentioned range listed, please consider follow up with your Primary Care Provider.   You have been scheduled for an endoscopy. Please follow written instructions given to you at your visit today. If you use inhalers (even only as needed), please bring them with you on the day of your procedure. Your physician has requested that you go to www.startemmi.com and enter the access code given to you at your visit today. This web site gives a general overview about your procedure. However, you should still follow specific instructions given to you by our office regarding your preparation for the procedure.  It was a pleasure to see you today!  Dr. [name]

## 2018-09-20 ENCOUNTER — Encounter: Payer: Self-pay | Admitting: Physician Assistant

## 2018-09-20 ENCOUNTER — Ambulatory Visit (AMBULATORY_SURGERY_CENTER): Payer: Self-pay | Admitting: Gastroenterology

## 2018-09-20 ENCOUNTER — Encounter: Payer: Self-pay | Admitting: Gastroenterology

## 2018-09-20 ENCOUNTER — Other Ambulatory Visit: Payer: Self-pay

## 2018-09-20 VITALS — BP 177/89 | HR 81 | Temp 97.1°F | Resp 16 | Ht 72.0 in | Wt 320.0 lb

## 2018-09-20 DIAGNOSIS — K228 Other specified diseases of esophagus: Secondary | ICD-10-CM

## 2018-09-20 DIAGNOSIS — K3189 Other diseases of stomach and duodenum: Secondary | ICD-10-CM

## 2018-09-20 DIAGNOSIS — K2289 Other specified disease of esophagus: Secondary | ICD-10-CM

## 2018-09-20 DIAGNOSIS — R12 Heartburn: Secondary | ICD-10-CM

## 2018-09-20 DIAGNOSIS — R1310 Dysphagia, unspecified: Secondary | ICD-10-CM

## 2018-09-20 DIAGNOSIS — C159 Malignant neoplasm of esophagus, unspecified: Secondary | ICD-10-CM

## 2018-09-20 DIAGNOSIS — R634 Abnormal weight loss: Secondary | ICD-10-CM

## 2018-09-20 MED ORDER — SODIUM CHLORIDE 0.9 % IV SOLN: 500.0000 mL | Freq: Once | INTRAVENOUS | Status: DC

## 2018-09-20 NOTE — Progress Notes (Signed)
Called to room to assist during endoscopic procedure.  Patient ID and intended procedure confirmed with present staff. Received instructions for my participation in the procedure from the performing physician.  

## 2018-09-20 NOTE — Progress Notes (Signed)
PT taken to PACU. Monitors in place. VSS. Report given to RN. 

## 2018-09-20 NOTE — Op Note (Signed)
[facility] Patient Name: Darrell Sutton Procedure Date: 09/20/2018 2:49 PM MRN: [id number] Endoscopist: Mallie Mussel L. [name] , MD Age: 57 Referring MD:  Date of Birth: 10/02/1961 Gender: Male Account #: [id number] Procedure:                Upper GI endoscopy Indications:              Dysphagia, Heartburn, Weight loss Medicines:                Monitored Anesthesia Care Procedure:                Pre-Anesthesia Assessment:                           - Prior to the procedure, a History and Physical                            was performed, and patient medications and                            allergies were reviewed. The patient's tolerance of                            previous anesthesia was also reviewed. The risks                            and benefits of the procedure and the sedation                            options and risks were discussed with the patient.                            All questions were answered, and informed consent                            was obtained. Anticoagulants: The patient has taken                            aspirin. It was decided not to withhold this                            medication prior to the procedure. ASA Grade                            Assessment: III - A patient with severe systemic                            disease. After reviewing the risks and benefits,                            the patient was deemed in satisfactory condition to                            undergo the procedure.  After obtaining informed consent, the endoscope was                            passed under direct vision. Throughout the                            procedure, the patient's blood pressure, pulse, and                            oxygen saturations were monitored continuously. The                            Endoscope was introduced through the mouth, and                            advanced to the jejunum. The upper GI endoscopy  was                            accomplished without difficulty. The patient                            tolerated the procedure well. Scope In: Scope Out: Findings:                 The larynx was normal.                           A large, fungating and ulcerating mass with no                            stigmata of recent bleeding was found in the lower                            third of the esophagus, 37 to 43 cm from the                            incisors. The mass was partially obstructing and                            circumferential. The scope was able to pass with                            mild resistance. Multiple biopsies were taken with                            a cold forceps for histology.                           Evidence of a gastric bypass was found. A gastric                            pouch with a normal size was found. The  gastrojejunal anastomosis was characterized by                            shallow ulceration. This was traversed. The                            pouch-to-jejunum limb was characterized by healthy                            appearing mucosa.                           The examined jejunum was normal. Complications:            No immediate complications. Estimated Blood Loss:     Estimated blood loss was minimal. Impression:               - Normal larynx.                           - Partially obstructing, likely malignant                            esophageal tumor was found in the lower third of                            the esophagus. Biopsied.                           - Gastric bypass with a normal-sized pouch and                            gastrojejunal anastomosis characterized by                            ulceration.                           - Normal examined jejunum. Recommendation:           - Patient has a contact number available for                            emergencies. The signs and symptoms of potential                             delayed complications were discussed with the                            patient. Return to normal activities tomorrow.                            Written discharge instructions were provided to the                            patient.                           -  Soft diet.                           - Continue present medications.                           - Await pathology results. If malignancy confirmed,                            oncology referral. Mallie Mussel L. [name], MD 09/20/2018 3:48:43 PM This report has been signed electronically.

## 2018-09-20 NOTE — Patient Instructions (Signed)
Thank you for allowing [place] to care for you today!  Resume previous medications.  Return to normal activities tomorrow.  Soft diet.  Await pathology results.  Will advise after results are final with  next steps.      YOU HAD AN ENDOSCOPIC PROCEDURE TODAY AT [facility]:   Refer to the procedure report that was given to you for any specific questions about what was found during the examination.  If the procedure report does not answer your questions, please call your gastroenterologist to clarify.  If you requested that your care partner not be given the details of your procedure findings, then the procedure report has been included in a sealed envelope for you to review at your convenience later.  YOU SHOULD EXPECT: Some feelings of bloating in the abdomen. Passage of more gas than usual.  Walking can help get rid of the air that was put into your GI tract during the procedure and reduce the bloating. If you had a lower endoscopy (such as a colonoscopy or flexible sigmoidoscopy) you may notice spotting of blood in your stool or on the toilet paper. If you underwent a bowel prep for your procedure, you may not have a normal bowel movement for a few days.  Please Note:  You might notice some irritation and congestion in your nose or some drainage.  This is from the oxygen used during your procedure.  There is no need for concern and it should clear up in a day or so.  SYMPTOMS TO REPORT IMMEDIATELY:    Following upper endoscopy (EGD)  Vomiting of blood or coffee ground material  New chest pain or pain under the shoulder blades  Painful or persistently difficult swallowing  New shortness of breath  Fever of 100F or higher  Black, tarry-looking stools  For urgent or emergent issues, a gastroenterologist can be reached at any hour by calling [phone].   DIET:  We do recommend a small meal at first, but then you may proceed to your regular diet.  Drink plenty of  fluids but you should avoid alcoholic beverages for 24 hours.  ACTIVITY:  You should plan to take it easy for the rest of today and you should NOT DRIVE or use heavy machinery until tomorrow (because of the sedation medicines used during the test).    FOLLOW UP: Our staff will call the number listed on your records the next business day following your procedure to check on you and address any questions or concerns that you may have regarding the information given to you following your procedure. If we do not reach you, we will leave a message.  However, if you are feeling well and you are not experiencing any problems, there is no need to return our call.  We will assume that you have returned to your regular daily activities without incident.  If any biopsies were taken you will be contacted by phone or by letter within the next 1-3 weeks.  Please call us at [phone] if you have not heard about the biopsies in 3 weeks.    SIGNATURES/CONFIDENTIALITY: You and/or your care partner have signed paperwork which will be entered into your electronic medical record.  These signatures attest to the fact that that the information above on your After Visit Summary has been reviewed and is understood.  Full responsibility of the confidentiality of this discharge information lies with you and/or your care-partner.

## 2018-09-21 ENCOUNTER — Telehealth: Payer: Self-pay | Admitting: Gastroenterology

## 2018-09-21 ENCOUNTER — Telehealth: Payer: Self-pay

## 2018-09-21 ENCOUNTER — Other Ambulatory Visit: Payer: Self-pay

## 2018-09-21 DIAGNOSIS — C159 Malignant neoplasm of esophagus, unspecified: Secondary | ICD-10-CM

## 2018-09-21 DIAGNOSIS — K228 Other specified diseases of esophagus: Secondary | ICD-10-CM

## 2018-09-21 DIAGNOSIS — K2289 Other specified disease of esophagus: Secondary | ICD-10-CM

## 2018-09-21 NOTE — Telephone Encounter (Signed)
Urgent referral made for oncology.

## 2018-09-21 NOTE — Telephone Encounter (Signed)
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  Full pathology report will be coming.  I spoke to Darrell Sutton about this, and he understands the next step is an oncology referral.  I think he needs ASAP evaluation because he is likely to need chemotherapy and radiation in the near future due to his severity of dysphagia and weight loss.  I am not currently ordering an imaging study for staging, since it has been my experience lately that oncology seems to prefer a standard CT scan in some cases, but PET scan another cases.  Since I do not want Darrell Sutton to have redundant tests, I would prefer the oncologists see him and decide what staging work-up to do next.  Darrell Sutton understands that if that imaging is still inconclusive, he might need endoscopic ultrasound.  I will copy this to [facility], the cancer coordinator, to hopefully expedite Darrell Sutton's evaluation.

## 2018-09-21 NOTE — Telephone Encounter (Signed)
  Follow up Call-  Call back number 09/20/2018  Post procedure Call Back phone  # [phone]  Permission to leave phone message Yes     Patient questions:  Do you have a fever, pain , or abdominal swelling? No. Pain Score  0 *  Have you tolerated food without any problems? Yes.    Have you been able to return to your normal activities? Yes.   "as normal as its been" Do you have any questions about your discharge instructions: Diet   No. Medications  No. Follow up visit  No.  Do you have questions or concerns about your Care? No.  Actions: * If pain score is 4 or above: No action needed, pain <4.

## 2018-09-22 ENCOUNTER — Encounter: Payer: Self-pay | Admitting: Hematology

## 2018-09-22 ENCOUNTER — Telehealth: Payer: Self-pay | Admitting: Hematology

## 2018-09-22 ENCOUNTER — Other Ambulatory Visit: Payer: Self-pay | Admitting: Hematology

## 2018-09-22 ENCOUNTER — Other Ambulatory Visit: Payer: Self-pay

## 2018-09-22 DIAGNOSIS — C155 Malignant neoplasm of lower third of esophagus: Secondary | ICD-10-CM

## 2018-09-22 NOTE — Progress Notes (Signed)
  Oncology Nurse Navigator Documentation  PET scan scheduled for 09/29/18. Nutrition appointment 09/28/18 with Dory Peru after initial consult with [name]. Called patient to introduce myself and explain role of GI navigator. Patient has my contact information. Plan to meet patient at initial consult. Will follow as needed. No barriers to tx identified at this time.

## 2018-09-22 NOTE — Telephone Encounter (Signed)
Received a new referral from Dr. [name] for esophageal mass. Pt has been scheduled to see [name] on 11/14 at 230pm. I cld and left the pt a vm with the appt date and time. Letter mailed.

## 2018-09-25 NOTE — Progress Notes (Signed)
GI Location of Tumor / Histology: Malignant neoplasm of lower third of esophagus  Darrell Sutton presented with several months of steadily worsening upper digestive symptoms.  He reports solid and liquid dysphagia, incessant heartburn, vomiting 2-3 times a day and nearly 50 pounds of weight loss in the last few months.  PET 09/29/2018  Upper endoscopy 09/20/2018: Normal larynx, partially obstructing,likely malignant esophageal tumor was found in the lower third of the esophagus.  Biopsies of   Past/Anticipated interventions by Gastroenterology, if any: Dr. [name] 09/21/2018 - Upper endoscopy with possible dilation 09/20/2018. 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 -Next step is oncology referral. -I think he needs an evaluation ASAP because he is likely to need chemotherapy and radiation in the near future due to his severity of dysphagia and weight loss. - If the imaging studies are still inconclusive, he might need endoscopic ultrasound.  Past/Anticipated interventions by surgeon, if any:   Past/Anticipated interventions by medical oncology, if any:  [name] 09/28/2018 2:30 pm  Weight changes, if any: Down about 2 pounds since last week.  Food having a hard time going down.  Bowel/Bladder complaints, if any: No  Nausea / Vomiting, if any: Frequently, gagging a lot. Dry heaving in the middle of the night, aspiration sometimes.  Pain issues, if any:  3/10     BP (!) 167/87 (BP Location: Left Arm, Patient Position: Sitting)   Pulse 84   Temp 98.4 F (36.9 C) (Oral)   Resp 18   Ht 6' (1.829 m)   Wt (!) 318 lb 2 oz (144.3 kg)   SpO2 97%   BMI 43.15 kg/m    Wt Readings from Last 3 Encounters:  09/26/18 (!) 318 lb 2 oz (144.3 kg)  09/20/18 (!) 320 lb (145.2 kg)  09/19/18 (!) 320 lb (145.2 kg)   SAFETY ISSUES:  Prior radiation? No  Pacemaker/ICD? No, has some stents  Possible current pregnancy? No  Is the patient on methotrexate? No  Current  Complaints/Details: -Was told from the ER scans there is a mass in the bladder.  Follow up with urologist.

## 2018-09-26 ENCOUNTER — Encounter: Payer: Self-pay | Admitting: Physician Assistant

## 2018-09-26 ENCOUNTER — Other Ambulatory Visit: Payer: Self-pay

## 2018-09-26 ENCOUNTER — Encounter: Payer: Self-pay | Admitting: Radiation Oncology

## 2018-09-26 ENCOUNTER — Ambulatory Visit
Admission: RE | Admit: 2018-09-26 | Discharge: 2018-09-26 | Disposition: A | Discharge status: Home / Self Care | Payer: Medicaid - Out of State | Source: Ambulatory Visit | Attending: Radiation Oncology | Admitting: Radiation Oncology

## 2018-09-26 VITALS — BP 167/87 | HR 84 | Temp 98.4°F | Resp 18 | Ht 72.0 in | Wt 318.1 lb

## 2018-09-26 DIAGNOSIS — Z880 Allergy status to penicillin: Secondary | ICD-10-CM | POA: Diagnosis not present

## 2018-09-26 DIAGNOSIS — N329 Bladder disorder, unspecified: Secondary | ICD-10-CM | POA: Diagnosis not present

## 2018-09-26 DIAGNOSIS — Z79899 Other long term (current) drug therapy: Secondary | ICD-10-CM | POA: Insufficient documentation

## 2018-09-26 DIAGNOSIS — I10 Essential (primary) hypertension: Secondary | ICD-10-CM | POA: Insufficient documentation

## 2018-09-26 DIAGNOSIS — Z7984 Long term (current) use of oral hypoglycemic drugs: Secondary | ICD-10-CM | POA: Diagnosis not present

## 2018-09-26 DIAGNOSIS — C155 Malignant neoplasm of lower third of esophagus: Secondary | ICD-10-CM | POA: Diagnosis present

## 2018-09-26 DIAGNOSIS — Z7982 Long term (current) use of aspirin: Secondary | ICD-10-CM | POA: Diagnosis not present

## 2018-09-26 DIAGNOSIS — Z9884 Bariatric surgery status: Secondary | ICD-10-CM | POA: Diagnosis not present

## 2018-09-26 DIAGNOSIS — E119 Type 2 diabetes mellitus without complications: Secondary | ICD-10-CM | POA: Diagnosis not present

## 2018-09-26 NOTE — Progress Notes (Signed)
Radiation Oncology         [phone] ________________________________  Name: Darrell Sutton        MRN: [id number]  Date of Service: 09/26/2018 DOB: 22-Jul-1961  CC:[name], PA  [place], [place], [place]     REFERRING PHYSICIAN: [name], [place]   DIAGNOSIS: The primary encounter diagnosis was Malignant neoplasm of lower third of esophagus ([place]). A diagnosis of Cancer of lower third of esophagus (HCC) was also pertinent to this visit.   HISTORY OF PRESENT ILLNESS: Darrell Sutton is a 57 y.o. male seen at the request of Dr. [name] for a newly diagnosed esophagus cancer.  The patient has had prior bariatric surgery in 2010 by Dr. Elisabeth Cara in [place] in [place].  He reports that he believes it was a Roux-en-Y procedure.  He was successful in weight loss, and appears to have lost about 200 pounds.  He has lost about 50 pounds in the last 6 months and has had progressive dysphasia, which led to evaluation in the ED and a CT abdomen pelvis revealed a sliding type heiatal hernia, and a calcified bladder mass. He proceeded to evaluation with Dr. [name].  On 09/20/2018 he underwent an endoscopic assessment which revealed a large fungating and ulcerating mass with in the lower third of the esophagus partially obstructing and circumferential.  Mild resistance was noted against the scope, and evidence of gastric bypass was found with a gastric pouch normal in size and JG anastomosis well characterized by shallow ulceration, this was traversed, the pouch to jejunum limb was characterized by healthy-appearing mucosa, and the exam examined jejunum was normal.  A biopsy revealed adenocarcinoma in the setting of Barrett's esophagus. He is scheduled for PET imaging this Friday and to see [name]. He comes today to discuss treatment recommendations for his cancer. He is also awaiting on evaluation at [facility].     PREVIOUS RADIATION THERAPY: [place]   PAST MEDICAL HISTORY:  Past  Medical History:  Diagnosis Date  . Anxiety 2009   started with divorce  . Diabetes mellitus without complication ([place])   . Gastric ulcer    from NSAID overuse  . Hyperlipidemia   . Hypertension   . MI (myocardial infarction) ([place]) 2011   has two stents  . Obesity    highest weight in 500's       PAST SURGICAL HISTORY: Past Surgical History:  Procedure Laterality Date  . [place] SURGERY  2010  . GASTRIC BYPASS  11/24/2008  . JOINT REPLACEMENT     Bilateral knee  . TONSILECTOMY/ADENOIDECTOMY WITH MYRINGOTOMY       FAMILY HISTORY:  Family History  Problem Relation Age of Onset  . Hyperlipidemia Mother   . Hypertension Mother   . Hyperlipidemia Father   . Hypertension Father   . Stroke Father   . Diabetes Brother   . Heart attack Brother 31  . Hyperlipidemia Brother   . Hypertension Brother   . Heart attack Maternal Grandmother   . Heart attack Maternal Grandfather   . Early death Maternal Grandfather   . Esophageal cancer Paternal Grandmother   . Colon cancer Paternal Grandfather   . Stomach cancer Neg Hx      SOCIAL HISTORY:  reports that he has never smoked. He has never used smokeless tobacco. He reports that he has current or past drug history. Drug: Marijuana. He reports that he does not drink alcohol.   ALLERGIES: Penicillins   MEDICATIONS:  Current Outpatient Medications  Medication Sig Dispense Refill  . albuterol (PROAIR HFA) 108 (90 Base) MCG/ACT inhaler Inhale 2 puffs into the lungs every 6 (six) hours as needed for wheezing or shortness of breath.    Marland Kitchen albuterol (PROVENTIL) (2.5 MG/3ML) 0.083% nebulizer solution Take 2.5 mg by nebulization every 6 (six) hours as needed for wheezing or shortness of breath.    Marland Kitchen amLODipine-benazepril (LOTREL) 10-20 MG capsule Take 1 capsule by mouth daily.    Marland Kitchen aspirin EC 81 MG tablet Take 81 mg by mouth daily.    Marland Kitchen atorvastatin (LIPITOR) 20 MG tablet Take 1 tablet (20 mg total) by mouth daily. 90 tablet 3  .  CALCIUM CITRATE PO Take 1 tablet by mouth daily.    . Cyanocobalamin (VITAMIN B-12 PO) Take 1 tablet by mouth daily.    . empagliflozin (JARDIANCE) 10 MG TABS tablet Take 10 mg by mouth daily. 30 tablet 2  . glimepiride (AMARYL) 2 MG tablet Take 2-6 mg by mouth See admin instructions. Take 6 mg by mouth in the morning and 2 mg in late evening    . hydrochlorothiazide (HYDRODIURIL) 25 MG tablet Take 25 mg by mouth daily.    Marland Kitchen liraglutide (VICTOZA) 18 MG/3ML SOPN Inject 1.8 mg into the skin daily.    . metFORMIN (GLUCOPHAGE) 1000 MG tablet Take 1,000 mg by mouth 2 (two) times daily with a meal.    . metoprolol tartrate (LOPRESSOR) 50 MG tablet Take 75 mg by mouth daily.    . Multiple Vitamins-Minerals (CENTRUM MEN) TABS Take 1 tablet by mouth daily.    . pantoprazole (PROTONIX) 40 MG tablet Take 40 mg by mouth daily.    Marland Kitchen PARoxetine (PAXIL) 40 MG tablet Take 40 mg by mouth every morning.    . sucralfate (CARAFATE) 1 g tablet Take 1 tablet (1 g total) by mouth 4 (four) times daily -  with meals and at bedtime. 60 tablet 0   Current Facility-Administered Medications  Medication Dose Route Frequency Provider Last Rate Last Dose  . 0.9 %  sodium chloride infusion  500 mL Intravenous Once Doran Stabler, MD         REVIEW OF SYSTEMS: On review of systems, the patient reports that he is doing okay overall. He is having pain with swallowing and is using carafate. He had been swallowing the pill whole and was counseled on crushing it and making a slurry with water today. He also describes difficulty with indigestion, poor intake, and difficulty with thicker textured foods. He denies any chest pain, shortness of breath, cough, fevers, chills, night sweats. He admits to about 50 pounds of weight loss in the last 6 months. He denies any bowel or bladder disturbances, and denies abdominal pain, nausea or vomiting. He denies any new musculoskeletal or joint aches or pains. A complete review of systems is  obtained and is otherwise negative.     PHYSICAL EXAM:  Wt Readings from Last 3 Encounters:  09/26/18 (!) 318 lb 2 oz (144.3 kg)  09/20/18 (!) 320 lb (145.2 kg)  09/19/18 (!) 320 lb (145.2 kg)   Temp Readings from Last 3 Encounters:  09/26/18 98.4 F (36.9 C) (Oral)  09/20/18 (!) 97.1 F (36.2 C)  08/24/18 98.4 F (36.9 C) (Oral)   BP Readings from Last 3 Encounters:  09/26/18 (!) 167/87  09/20/18 (!) 177/89  09/19/18 130/90   Pulse Readings from Last 3 Encounters:  09/26/18 84  09/20/18 81  09/19/18 80   Pain Assessment Pain  Score: 3  Pain Frequency: Intermittent Pain Loc: Chest(hard to swallow, feels like something stuck.)/10  In general this is a well appearing caucasian male in [place] acute distress. He's alert and oriented x4 and appropriate throughout the examination. Cardiopulmonary assessment is negative for acute distress and he exhibits normal effort.    ECOG = 1  0 - Asymptomatic (Fully active, able to carry on all predisease activities without restriction)  1 - Symptomatic but completely ambulatory (Restricted in physically strenuous activity but ambulatory and able to carry out work of a light or sedentary nature. For example, light housework, office work)  2 - Symptomatic, <50% in bed during the day (Ambulatory and capable of all self care but unable to carry out any work activities. Up and about more than 50% of waking hours)  3 - Symptomatic, >50% in bed, but not bedbound (Capable of only limited self-care, confined to bed or chair 50% or more of waking hours)  4 - Bedbound (Completely disabled. Cannot carry on any self-care. Totally confined to bed or chair)  5 - Death   Eustace Pen MM, Creech RH, Tormey DC, et al. [phone]). "Toxicity and response criteria of the [facility]". [place] Oncol. 5 (6): 649-55    LABORATORY DATA:  Lab Results  Component Value Date   WBC 8.1 08/22/2018   HGB 13.2 08/22/2018   HCT 47.0 08/22/2018    MCV 78.5 (L) 08/22/2018   PLT 466 (H) 08/22/2018   Lab Results  Component Value Date   NA 136 08/22/2018   K 4.4 08/22/2018   CL 98 08/22/2018   CO2 26 08/22/2018   Lab Results  Component Value Date   ALT 13 08/22/2018   AST 15 08/22/2018   ALKPHOS 66 08/22/2018   BILITOT 0.6 08/22/2018      RADIOGRAPHY: [place] results found.     IMPRESSION/PLAN:. 1. Adenocarcinoma of the distal esophagus. Dr. Lisbeth Renshaw discusses the pathology findings and reviews the nature of esophageal cancer.  He agrees with continuing the work-up which includes pet imaging.  Patient is scheduled for this on Friday.  We will follow-up with these results when available.  Is difficult given the fact that he has not had full imaging of this area to know whether he would be a candidate for surgery alone versus radiation and chemo.  We anticipate concurrent neoadjuvant treatment, and will copy our notes to Dr. Servando Snare.  We discussed the risks, benefits, short, and long term effects of radiotherapy. Dr. Lisbeth Renshaw discusses the delivery and logistics of radiotherapy and anticipates a course of 5 1/2 weeks of radiotherapy to the esophagus and any regional lymph nodes involved.  We will hold off on signing consent as the plan has not been finalized.  He is in agreement.  We will also discuss his case in GI conference. 2. Prior gastric bypass.  We are going to obtain the prior records from the patient's surgical procedure, and forward them on to Dr. Servando Snare.  His prior surgery will likely impact whether he is a candidate for esophagectomy. 3. Bladder mass. The patient is also being worked up currently for this and will see 1 of the physicians at [facility] urology.  I will reach out to the GU navigator to determine who the patient will be seeing  In a visit lasting 60 minutes, greater than 50% of the time was spent face to face discussing his case, and coordinating the patient's care.   The above documentation reflects my direct findings  during this shared patient visit. Please see the separate note by Dr. Lisbeth Renshaw on this date for the remainder of the patient's plan of care.    Carola Rhine, PAC

## 2018-09-26 NOTE — Telephone Encounter (Signed)
Darrell Sutton, pt still has not heard from Urology. Referral placed 10/10. Pt has a bladder mass and needs to be seen ASAP.

## 2018-09-27 ENCOUNTER — Encounter: Payer: Self-pay | Admitting: *Deleted

## 2018-09-27 NOTE — Progress Notes (Signed)
Darrell Sutton  Holiday representative received referral from radiation oncology for financial and insurance concerns.  CSW contacted patient at home to offer support and assess for needs.  CSW spoke with patient and patients wife.  Patient stated he and his family recently moved from [place], where he had Medicaid.  Due to recent diagnosis patient and his wife are unable to [place] or find new employment.  CSW encouraged patient to contact DSS to discuss applying for Medicaid for himself and his 21 year old son.  CSW and patient also discussed applying for Social Security Disability once the staging process was complete.  CSW referred patient to the financial advocate to discuss applying for the [place] and exploring additional resources.  CSW provided contact information and encouraged patient to call with questions or concerns.    Darrell [place], MSW, LCSW, OSW-C Clinical Social Worker [facility] [phone]

## 2018-09-27 NOTE — Progress Notes (Signed)
[place] Psychosocial Distress Screening Clinical Social Work  Clinical Social Work was referred by distress screening protocol.  The patient scored a 9 on the Psychosocial Distress Thermometer which indicates severe distress. Clinical Social Worker contacted patient by phone to assess for distress and other psychosocial needs.     See previous CSW note.  ONCBCN DISTRESS SCREENING 09/26/2018  Screening Type Initial Screening  Distress experienced in past week (1-10) 9  Practical problem type Insurance  Emotional problem type Nervousness/Anxiety;Adjusting to illness  Spiritual/Religous concerns type Facing my mortality  Physical Problem type Nausea/vomiting;Sleep/insomnia  Other [phone], [phone]    Johnnye Lana, MSW, LCSW, OSW-C Clinical Social Worker [place] [phone]

## 2018-09-28 ENCOUNTER — Inpatient Hospital Stay: Payer: Medicaid - Out of State | Attending: Hematology | Admitting: Nutrition

## 2018-09-28 ENCOUNTER — Ambulatory Visit: Payer: Self-pay | Admitting: Hematology

## 2018-09-28 DIAGNOSIS — Z7982 Long term (current) use of aspirin: Secondary | ICD-10-CM | POA: Insufficient documentation

## 2018-09-28 DIAGNOSIS — Z9884 Bariatric surgery status: Secondary | ICD-10-CM | POA: Insufficient documentation

## 2018-09-28 DIAGNOSIS — C155 Malignant neoplasm of lower third of esophagus: Secondary | ICD-10-CM | POA: Insufficient documentation

## 2018-09-28 DIAGNOSIS — Z79899 Other long term (current) drug therapy: Secondary | ICD-10-CM | POA: Insufficient documentation

## 2018-09-28 DIAGNOSIS — E119 Type 2 diabetes mellitus without complications: Secondary | ICD-10-CM | POA: Insufficient documentation

## 2018-09-28 DIAGNOSIS — Z800 Family history of malignant neoplasm of digestive organs: Secondary | ICD-10-CM | POA: Insufficient documentation

## 2018-09-28 DIAGNOSIS — Z7984 Long term (current) use of oral hypoglycemic drugs: Secondary | ICD-10-CM | POA: Insufficient documentation

## 2018-09-28 DIAGNOSIS — I10 Essential (primary) hypertension: Secondary | ICD-10-CM | POA: Insufficient documentation

## 2018-09-28 NOTE — Progress Notes (Signed)
57 year old male diagnosed with esophageal cancer.  He is a patient of [name] and Dr. Lisbeth Renshaw.  Past medical history includes Roux-en-Y gastric bypass in 2010, obesity, MI, hypertension, hyperlipidemia, gastric ulcer, diabetes, and anxiety.  Medications include Lipitor, vitamin B12, Jardiance, Amaryl, Victoza, Glucophage, multivitamin, Protonix, Carafate, and calcium.  Labs include HG A1c of 9.5.  Height: 6 feet 0 inches. Weight: 317.4 pounds. Usual body weight: 360 pounds in the early summer per patient. BMI: 43.05.  Patient reports he began losing weight in the early part of the summer. Reports he has lost approximately 50 pounds over the last 3 months. He started to experience early satiety and heartburn. He is vomiting several times a day as a result of solid and liquid dysphasia. Patient reports he can tolerate [place] granola bars, yogurt, broth-based soups, whey protein shakes with fruit, Glucerna, and grits. Reports he has been compliant with taking his vitamin and mineral supplements status post Roux-en-Y.  Nutrition diagnosis:  Severe malnutrition in the context of chronic illness related to esophageal cancer and dysphasia as evidenced by 12% weight loss over 6 months, less than 75% energy intake for greater than 1 month, severe depletion of muscle mass and body fat.  Intervention: Educated patient to consume smaller more frequent meals and snacks incorporating high-protein foods. Encourage soft moist proteins as well as adding gravies and sauces as tolerated. Encouraged fluids as tolerated between meals. Recommended patient consume 2-3 protein shakes daily. Provided samples. Recommended 2 chewable adult multivitamins, 1500 mg calcium citrate and 350-500 mcg of vitamin B12 daily. Questions were answered.  Teach back method used.  Contact information provided.  Monitoring, evaluation, goals: Patient will tolerate high protein, high calorie foods to minimize further  weight loss.  Next visit: To be scheduled with treatment weekly.  **Disclaimer: This note was dictated with voice recognition software. Similar sounding words can inadvertently be transcribed and this note may contain transcription errors which may not have been corrected upon publication of note.**

## 2018-09-29 ENCOUNTER — Inpatient Hospital Stay (HOSPITAL_BASED_OUTPATIENT_CLINIC_OR_DEPARTMENT_OTHER): Payer: Medicaid - Out of State | Admitting: Hematology

## 2018-09-29 ENCOUNTER — Ambulatory Visit (HOSPITAL_COMMUNITY)
Admission: RE | Admit: 2018-09-29 | Discharge: 2018-09-29 | Disposition: A | Discharge status: Home / Self Care | Payer: Medicaid - Out of State | Source: Ambulatory Visit | Attending: Hematology | Admitting: Hematology

## 2018-09-29 ENCOUNTER — Encounter: Payer: Self-pay | Admitting: Hematology

## 2018-09-29 VITALS — BP 180/99 | HR 95 | Temp 98.3°F | Resp 20 | Ht 72.0 in | Wt 317.4 lb

## 2018-09-29 DIAGNOSIS — Z9884 Bariatric surgery status: Secondary | ICD-10-CM | POA: Diagnosis not present

## 2018-09-29 DIAGNOSIS — Z7982 Long term (current) use of aspirin: Secondary | ICD-10-CM

## 2018-09-29 DIAGNOSIS — Z79899 Other long term (current) drug therapy: Secondary | ICD-10-CM

## 2018-09-29 DIAGNOSIS — I10 Essential (primary) hypertension: Secondary | ICD-10-CM | POA: Diagnosis not present

## 2018-09-29 DIAGNOSIS — E119 Type 2 diabetes mellitus without complications: Secondary | ICD-10-CM | POA: Diagnosis not present

## 2018-09-29 DIAGNOSIS — Z7984 Long term (current) use of oral hypoglycemic drugs: Secondary | ICD-10-CM | POA: Diagnosis not present

## 2018-09-29 DIAGNOSIS — C155 Malignant neoplasm of lower third of esophagus: Secondary | ICD-10-CM | POA: Diagnosis not present

## 2018-09-29 DIAGNOSIS — I2510 Atherosclerotic heart disease of native coronary artery without angina pectoris: Secondary | ICD-10-CM

## 2018-09-29 DIAGNOSIS — F419 Anxiety disorder, unspecified: Secondary | ICD-10-CM

## 2018-09-29 DIAGNOSIS — Z800 Family history of malignant neoplasm of digestive organs: Secondary | ICD-10-CM

## 2018-09-29 LAB — GLUCOSE, CAPILLARY: Glucose-Capillary: 217 mg/dL — ABNORMAL HIGH (ref 70–99)

## 2018-09-29 MED ORDER — FLUDEOXYGLUCOSE F - 18 (FDG) INJECTION
16.4000 | Freq: Once | INTRAVENOUS | Status: AC
  Administered 2018-09-29: 16.4 via INTRAVENOUS

## 2018-09-29 NOTE — Progress Notes (Signed)
[facility]  [phone]) [phone] Fax:([phone]) [place] Note   Patient Care Team: [name], [place] as PCP - General (Physician Assistant)   Date of Service:  09/29/2018  REFERRAL PHYSICIAN: GI Dr. [name]  CHIEF COMPLAINTS/PURPOSE OF CONSULTATION:  Newly Diagnosed Esophogeal Cancer    Cancer of lower third of esophagus ([place])   09/20/2018 Procedure    Upper Endoscopy by Dr. [name] 09/20/18  IMPRESSION - Normal larynx. - Partially obstructing, likely malignant esophageal tumor was found in the lower third of the esophagus. Biopsied. - Gastric bypass with a normal-sized pouch and gastrojejunal anastomosis characterized by ulceration. - Normal examined jejunum.    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 male is a here because of newly diagnosed Esophogeal cancer. The patient was referred by Dr. [name]. The patient presents to the clinic today accompanied by his wife.   He presented to his PCP Dr. Morene Rankins who referred him to Dr. [name] who did work up with CT and PET scan along with Endoscopy. He lost 60 pounds in the last 6 months. He notes he was getting full faster. He denied having dysphagia initially  Today the patient notes he eats mostly soft foods and granola bars. Foods like yogurt and water is not very palatable for him. For foods or liquids not palatable he will throw up due to his gag reflex. He notes he will get lightheaded and dizzy after walking. He denies nausea outside of laying down for long periods of time. He notes burning feeling when swallowing frequently and even without eating. He denies bloating, abdominal  cramping, constipation, headaches, or leg swelling. He does have a cough due to aspirating in the last few weeks. He will use Nebulizer as needed. He notes he energy is low and impacts his activity level. He is able to complete ADLs and can go outside as needed.  He notes if he needs a port, He would prefer a port.   Socially he recently moved to [place] from [place] in summer of 2019. He is married with 3 biologist children and a teenage stepson with ADHD he takes care of. He used to work in Therapist, art. His wife stopped looking for jobs to help him. They do not currently have insurance, but is interested. He notes he smoke Marijuana in the past and stopped in the 1990s.    They have a PMHx of gastric bypass surgery in 2010 for weight loss. That did work for him but he gained back most of that weight over the years. He had a MI in 2011. He has HTN and plans to get home monitor to check at home. He has DM and notes having 130s range of preprandial readings. He is on Paxil for anxiety which helps him. He notes his mental stress is a 6/10 currently.  He notes his paternal grandmother passed away from Quinn cancer when she was in her in her 72s. He notes colon cancer runs in his father's side. His father had non-hodgkin's lymphoma. He started having colonoscopy at 23 and had benign polyp removed in the past.  REVIEW OF SYSTEMS:   Constitutional: Denies fevers, chills or abnormal night sweats (+) weight loss  Eyes: Denies blurriness of vision, double vision or watery eyes Ears, nose, mouth, throat, and face: Denies mucositis or sore throat (+) moderate dysphagia  Respiratory: Denies cough, dyspnea or wheezes (+) SOB upon exertion (+) occasional aspiration Cardiovascular: Denies palpitation, chest discomfort or lower extremity swelling Gastrointestinal:  Denies nausea, heartburn or change in bowel habits Skin: Denies abnormal skin rashes Lymphatics: Denies new lymphadenopathy or easy  bruising Neurological:Denies numbness, tingling or new weaknesses Behavioral/Psych: Mood is stable, no new changes (+) anxious  All other systems were reviewed with the patient and are negative.    MEDICAL HISTORY:  Past Medical History:  Diagnosis Date  . Anxiety 2009   started with divorce  . Diabetes mellitus without complication ([place])   . Gastric ulcer    from NSAID overuse  . Hyperlipidemia   . Hypertension   . MI (myocardial infarction) ([place]) 2011   has two stents  . Obesity    highest weight in 500's    SURGICAL HISTORY: Past Surgical History:  Procedure Laterality Date  . [place] SURGERY  2010  . GASTRIC BYPASS  11/24/2008  . JOINT REPLACEMENT     Bilateral knee  . TONSILECTOMY/ADENOIDECTOMY WITH MYRINGOTOMY      SOCIAL HISTORY: Social History   Socioeconomic History  . Marital status: Married    Spouse name: Not on file  . Number of children: 3  . Years of education: Not on file  . Highest education level: Not on file  Occupational History  . Not on file  Social Needs  . Financial resource strain: Not on file  . Food insecurity:    Worry: Not on file    Inability: Not on file  . Transportation needs:    Medical: No    Non-medical: No  Tobacco Use  . Smoking status: Never Smoker  . Smokeless tobacco: Never Used  Substance and Sexual Activity  . Alcohol use: Never    Frequency: Never  . Drug use: Not Currently    Types: Marijuana    Comment: Youth years  . Sexual activity: Not on file  Lifestyle  . Physical activity:    Days per week: Not on file    Minutes per session: Not on file  . Stress: Not on file  Relationships  . Social connections:    Talks on phone: Not on file    Gets together: Not on file    Attends religious service: Not on file    Active member of club or organization: Not on file    Attends meetings of clubs or organizations: Not on file    Relationship status: Not on file  . Intimate partner violence:    Fear of  current or ex partner: Not on file    Emotionally abused: Not on file    Physically abused: Not on file    Forced sexual activity: Not on file  Other Topics Concern  . Not on file  Social History Narrative   From [place], moved here Sep 2019 with his wife and son   Son with autism, starting at [employer] HISTORY: Family History  Problem Relation Age of Onset  . Hyperlipidemia Mother   . Hypertension Mother   . Hyperlipidemia Father   . Hypertension Father   . Stroke Father   . Cancer Father        NHL  .  Diabetes Brother   . Heart attack Brother 76  . Hyperlipidemia Brother   . Hypertension Brother   . Heart attack Maternal Grandmother   . Heart attack Maternal Grandfather   . Early death Maternal Grandfather   . Esophageal cancer Paternal Grandmother   . Colon cancer Paternal Grandfather   . Colon cancer Cousin   . Stomach cancer Neg Hx     ALLERGIES:  is allergic to penicillins.  MEDICATIONS:  Current Outpatient Medications  Medication Sig Dispense Refill  . albuterol (PROAIR HFA) 108 (90 Base) MCG/ACT inhaler Inhale 2 puffs into the lungs every 6 (six) hours as needed for wheezing or shortness of breath.    Marland Kitchen albuterol (PROVENTIL) (2.5 MG/3ML) 0.083% nebulizer solution Take 2.5 mg by nebulization every 6 (six) hours as needed for wheezing or shortness of breath.    Marland Kitchen amLODipine-benazepril (LOTREL) 10-20 MG capsule Take 1 capsule by mouth daily.    Marland Kitchen aspirin EC 81 MG tablet Take 81 mg by mouth daily.    Marland Kitchen atorvastatin (LIPITOR) 20 MG tablet Take 1 tablet (20 mg total) by mouth daily. 90 tablet 3  . CALCIUM CITRATE PO Take 1 tablet by mouth daily.    . Cyanocobalamin (VITAMIN B-12 PO) Take 1 tablet by mouth daily.    . empagliflozin (JARDIANCE) 10 MG TABS tablet Take 10 mg by mouth daily. 30 tablet 2  . glimepiride (AMARYL) 2 MG tablet Take 2-6 mg by mouth See admin instructions. Take 6 mg by mouth in the morning and 2 mg in late evening    .  hydrochlorothiazide (HYDRODIURIL) 25 MG tablet Take 25 mg by mouth daily.    Marland Kitchen liraglutide (VICTOZA) 18 MG/3ML SOPN Inject 1.8 mg into the skin daily.    . metFORMIN (GLUCOPHAGE) 1000 MG tablet Take 1,000 mg by mouth 2 (two) times daily with a meal.    . metoprolol tartrate (LOPRESSOR) 50 MG tablet Take 75 mg by mouth daily.    . Multiple Vitamins-Minerals (CENTRUM MEN) TABS Take 1 tablet by mouth daily.    . pantoprazole (PROTONIX) 40 MG tablet Take 40 mg by mouth daily.    Marland Kitchen PARoxetine (PAXIL) 40 MG tablet Take 40 mg by mouth every morning.    . sucralfate (CARAFATE) 1 g tablet Take 1 tablet (1 g total) by mouth 4 (four) times daily -  with meals and at bedtime. 60 tablet 0   Current Facility-Administered Medications  Medication Dose Route Frequency Provider Last Rate Last Dose  . 0.9 %  sodium chloride infusion  500 mL Intravenous Once Nelida Meuse III, MD        PHYSICAL EXAMINATION: ECOG PERFORMANCE STATUS: 2 - Symptomatic, <50% confined to bed  Vitals:   09/29/18 1535 09/29/18 1544  BP: (!) 184/100 (!) 180/99  Pulse: 95   Resp: 20   Temp: 98.3 F (36.8 C)   SpO2: 98%    Filed Weights   09/29/18 1535  Weight: (!) 317 lb 6.4 oz (144 kg)    GENERAL:alert, no distress and comfortable SKIN: skin color, texture, turgor are normal, no rashes or significant lesions EYES: normal, conjunctiva are pink and non-injected, sclera clear OROPHARYNX:no exudate, no erythema and lips, buccal mucosa, and tongue normal  NECK: supple, thyroid normal size, non-tender, without nodularity LYMPH:  no palpable lymphadenopathy in the cervical, axillary or inguinal LUNGS: clear to auscultation and percussion with normal breathing effort HEART: regular rate & rhythm and no murmurs and no lower extremity edema ABDOMEN:abdomen soft,  non-tender and normal bowel sounds Musculoskeletal:no cyanosis of digits and no clubbing  PSYCH: alert & oriented x 3 with fluent speech NEURO: no focal motor/sensory  deficits  LABORATORY DATA:  I have reviewed the data as listed CBC Latest Ref Rng & Units 08/22/2018  WBC 4.0 - 10.5 K/uL 8.1  Hemoglobin 13.0 - 17.0 g/dL 13.2  Hematocrit 39.0 - 52.0 % 47.0  Platelets 150 - 400 K/uL 466(H)    CMP Latest Ref Rng & Units 08/22/2018  Glucose 70 - 99 mg/dL 172(H)  BUN 6 - 20 mg/dL 11  Creatinine 0.61 - 1.24 mg/dL 0.85  Sodium 135 - 145 mmol/L 136  Potassium 3.5 - 5.1 mmol/L 4.4  Chloride 98 - 111 mmol/L 98  CO2 22 - 32 mmol/L 26  Calcium 8.9 - 10.3 mg/dL 9.5  Total Protein 6.5 - 8.1 g/dL 7.1  Total Bilirubin 0.3 - 1.2 mg/dL 0.6  Alkaline Phos 38 - 126 U/L 66  AST 15 - 41 U/L 15  ALT 0 - 44 U/L 13     PATHOLOGY  Diagnosis 09/20/18  Esophagus, biopsy, mass - 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 Dr. Jeannie Done reviewed the case and agrees with the above diagnosis. Dr. [name] was notified of these results on September 21, 2018.   PROCEDURES   Upper Endoscopy by Dr. [name] 09/20/18  IMPRESSION - Normal larynx. - Partially obstructing, likely malignant esophageal tumor was found in the lower third of the esophagus. Biopsied. - Gastric bypass with a normal-sized pouch and gastrojejunal anastomosis characterized by ulceration. - Normal examined jejunum.   RADIOGRAPHIC STUDIES: I have personally reviewed the radiological images as listed and agreed with the findings in the report. No results found.    PET 09/29/18  PENDING    ASSESSMENT & PLAN:  Darrell Sutton is a 57 y.o. Caucasian male with a history of Anxiety, DM, HLD, HTN, obesity, MI and gastric Bypass surgery.    1. Esophogeal cancer, likely early stage -I reviewed his EGD findings, CT and PET scans images and pathology report in great detail with patient and his wife.  EGD showed a partially obstructing mass in the lower third  esophagus, biopsy showed adenocarcinoma of the esophagus. -He had a PET scan today which has not been read yet. Based on my view of the scan there does not appear to be distant metastasis, or significant hypermetabolic regional adenopathy. Will wait for the PET scan report.  -If PET is negative for node metastasis, I recommend EUS with Dr. [name] for tumor staging. Based on the EGD finding, he likely has locally advanced disease.  -I discussed standard care for locally advanced esophageal cancer (T3/4 or node positive), is neoadjuvant concurrent ChemoRT followed by hepatectomy. He has met with Dr. Lisbeth Renshaw already. He will consult with Dr. Servando Snare about surgery. -I discussed the small chance of cure with chemoradiation alone, surgery.  Given his young age, if he is a candidate for surgery, I encouraged him to consider surgery to increase chance of cure.  We discussed the aggressiveness of esophageal cancer, and high risk of recurrence after surgery. -I discussed the chemotherapy option, if he is a candidate for surgery, all of her weekly carboplatin AUC 2 and Taxol 50 mg/m for 5-6 cycles with concurrent radiation. -due to his dysphagia, regurgitation, nausea, partially obstructive esophageal tumor,, and weight loss, he will likely need J Tube before chemmoRT to  get adequate nutrition. He understands and is open to the J-tube placement. I will discuss with DRs. Moody and [employer].    2. Dysphagia, Vomiting w/o nausea, Weight loss secondary to #1 -He has a Bypass surgery in 2010 and gained most of that with back  -In the past 6 months he loss 60 pounds -He is not able to keep most foods down.  -He has met with Warnell Forester, and is on Glucerna    3. Possible aspirations, secondary to #1 -On albuterol inhaler as needed    4. Genetic testing -He has a family history of GI cancer in his paternal family and is eligible for testing. He is interested.  -I will refer him when he establishes insurance or  financial assistance.   5. HTN, DM -Managed by Dr. Morene Rankins. On amlodipine, Glimepiride, Viztoza Metformin and Jardiance.  -08/2018 his Hb A1c was 9.5.  -BP at 180/99 today (09/29/18) but normal usually when on medication -I discussed on treatment he will likely get steroid premedication. This will increase his glucose. If so, I may start him on insulin. He understands.   6. Financial Support -He and his wife just moved to [place] and are currently not working and do not have insurance -He has met with our financial office to set him up with aid. He is planning applying medicaid and disability     PLAN:  -will review the PET findings and discuss J-tube placement with Drs. Moody and [employer]. If node negative on PET, will send him back to Dr. [name] for EUS staging  -will determine ChemoRT based on his final staging.  -Send Genetic Referral    No orders of the defined types were placed in this encounter.   All questions were answered. The patient knows to call the clinic with any problems, questions or concerns. I spent 55 minutes counseling the patient face to face. The total time spent in the appointment was 60 minutes and more than 50% was on counseling.     Truitt Merle, MD 09/29/2018   I, Joslyn Devon, am acting as scribe for Truitt Merle, MD.   I have reviewed the above documentation for accuracy and completeness, and I agree with the above.

## 2018-09-29 NOTE — Progress Notes (Signed)
Met with Undrea Shipes and his wife. Explained role of nurse navigator. Contact names and phone numbers were provided for entire [facility] team.    Patient recently moved to [facility] from [place]. Patient nor his wife are employed. Once results from PET return will contact social work for assistance with this need. Will continue to follow as needed

## 2018-10-02 ENCOUNTER — Other Ambulatory Visit: Payer: Self-pay | Admitting: Hematology

## 2018-10-02 DIAGNOSIS — C155 Malignant neoplasm of lower third of esophagus: Secondary | ICD-10-CM

## 2018-10-03 ENCOUNTER — Encounter: Payer: Self-pay | Admitting: Hematology

## 2018-10-04 ENCOUNTER — Other Ambulatory Visit: Payer: Self-pay

## 2018-10-04 DIAGNOSIS — C155 Malignant neoplasm of lower third of esophagus: Secondary | ICD-10-CM

## 2018-10-04 NOTE — Progress Notes (Signed)
Tumor board 10/04/18: BX supraclavicular lymph node by IR. If lymph node negative, EUS for staging then neoadjuvant chemotherapy. Referral to Dr. Johney Maine for feeding tube.

## 2018-10-10 ENCOUNTER — Other Ambulatory Visit: Payer: Self-pay | Admitting: Radiology

## 2018-10-11 ENCOUNTER — Other Ambulatory Visit: Payer: Self-pay

## 2018-10-11 ENCOUNTER — Encounter (HOSPITAL_COMMUNITY): Payer: Self-pay

## 2018-10-11 ENCOUNTER — Ambulatory Visit (HOSPITAL_COMMUNITY)
Admission: RE | Admit: 2018-10-11 | Discharge: 2018-10-11 | Disposition: A | Discharge status: Home / Self Care | Payer: Medicaid - Out of State | Source: Ambulatory Visit | Attending: Hematology | Admitting: Hematology

## 2018-10-11 DIAGNOSIS — E669 Obesity, unspecified: Secondary | ICD-10-CM | POA: Insufficient documentation

## 2018-10-11 DIAGNOSIS — Z807 Family history of other malignant neoplasms of lymphoid, hematopoietic and related tissues: Secondary | ICD-10-CM | POA: Insufficient documentation

## 2018-10-11 DIAGNOSIS — E119 Type 2 diabetes mellitus without complications: Secondary | ICD-10-CM | POA: Insufficient documentation

## 2018-10-11 DIAGNOSIS — E785 Hyperlipidemia, unspecified: Secondary | ICD-10-CM | POA: Insufficient documentation

## 2018-10-11 DIAGNOSIS — Z96653 Presence of artificial knee joint, bilateral: Secondary | ICD-10-CM | POA: Diagnosis not present

## 2018-10-11 DIAGNOSIS — Z6841 Body Mass Index (BMI) 40.0 and over, adult: Secondary | ICD-10-CM | POA: Diagnosis not present

## 2018-10-11 DIAGNOSIS — Z880 Allergy status to penicillin: Secondary | ICD-10-CM | POA: Diagnosis not present

## 2018-10-11 DIAGNOSIS — C770 Secondary and unspecified malignant neoplasm of lymph nodes of head, face and neck: Secondary | ICD-10-CM | POA: Diagnosis not present

## 2018-10-11 DIAGNOSIS — I10 Essential (primary) hypertension: Secondary | ICD-10-CM | POA: Diagnosis not present

## 2018-10-11 DIAGNOSIS — Z9884 Bariatric surgery status: Secondary | ICD-10-CM | POA: Insufficient documentation

## 2018-10-11 DIAGNOSIS — Z79899 Other long term (current) drug therapy: Secondary | ICD-10-CM | POA: Insufficient documentation

## 2018-10-11 DIAGNOSIS — Z8711 Personal history of peptic ulcer disease: Secondary | ICD-10-CM | POA: Insufficient documentation

## 2018-10-11 DIAGNOSIS — Z955 Presence of coronary angioplasty implant and graft: Secondary | ICD-10-CM | POA: Insufficient documentation

## 2018-10-11 DIAGNOSIS — R591 Generalized enlarged lymph nodes: Secondary | ICD-10-CM | POA: Diagnosis present

## 2018-10-11 DIAGNOSIS — Z8249 Family history of ischemic heart disease and other diseases of the circulatory system: Secondary | ICD-10-CM | POA: Diagnosis not present

## 2018-10-11 DIAGNOSIS — F419 Anxiety disorder, unspecified: Secondary | ICD-10-CM | POA: Insufficient documentation

## 2018-10-11 DIAGNOSIS — Z833 Family history of diabetes mellitus: Secondary | ICD-10-CM | POA: Insufficient documentation

## 2018-10-11 DIAGNOSIS — Z823 Family history of stroke: Secondary | ICD-10-CM | POA: Insufficient documentation

## 2018-10-11 DIAGNOSIS — Z7982 Long term (current) use of aspirin: Secondary | ICD-10-CM | POA: Insufficient documentation

## 2018-10-11 DIAGNOSIS — I252 Old myocardial infarction: Secondary | ICD-10-CM | POA: Diagnosis not present

## 2018-10-11 DIAGNOSIS — C155 Malignant neoplasm of lower third of esophagus: Secondary | ICD-10-CM | POA: Diagnosis not present

## 2018-10-11 DIAGNOSIS — Z7984 Long term (current) use of oral hypoglycemic drugs: Secondary | ICD-10-CM | POA: Diagnosis not present

## 2018-10-11 HISTORY — PX: OTHER SURGICAL HISTORY: SHX169

## 2018-10-11 LAB — CBC
HEMATOCRIT: 46.8 % (ref 39.0–52.0)
HEMOGLOBIN: 13.6 g/dL (ref 13.0–17.0)
MCH: 22.4 pg — AB (ref 26.0–34.0)
MCHC: 29.1 g/dL — AB (ref 30.0–36.0)
MCV: 77.2 fL — AB (ref 80.0–100.0)
Platelets: 394 10*3/uL (ref 150–400)
RBC: 6.06 MIL/uL — AB (ref 4.22–5.81)
RDW: 14.2 % (ref 11.5–15.5)
WBC: 7.2 10*3/uL (ref 4.0–10.5)
nRBC: 0 % (ref 0.0–0.2)

## 2018-10-11 LAB — PROTIME-INR
INR: 1.08
Prothrombin Time: 13.9 seconds (ref 11.4–15.2)

## 2018-10-11 LAB — GLUCOSE, CAPILLARY: Glucose-Capillary: 204 mg/dL — ABNORMAL HIGH (ref 70–99)

## 2018-10-11 MED ORDER — FENTANYL CITRATE (PF) 100 MCG/2ML IJ SOLN
INTRAMUSCULAR | Status: AC | PRN
  Administered 2018-10-11: 25 ug via INTRAVENOUS
  Administered 2018-10-11: 50 ug via INTRAVENOUS

## 2018-10-11 MED ORDER — MIDAZOLAM HCL 2 MG/2ML IJ SOLN
INTRAMUSCULAR | Status: AC | PRN
  Administered 2018-10-11 (×2): 1 mg via INTRAVENOUS

## 2018-10-11 MED ORDER — HYDROCODONE-ACETAMINOPHEN 5-325 MG PO TABS: 1.0000 | ORAL_TABLET | ORAL | Status: DC | PRN

## 2018-10-11 MED ORDER — LIDOCAINE HCL (PF) 1 % IJ SOLN
INTRAMUSCULAR | Status: AC
  Filled 2018-10-11: qty 30

## 2018-10-11 MED ORDER — SODIUM CHLORIDE 0.9 % IV SOLN: INTRAVENOUS | Status: DC

## 2018-10-11 MED ORDER — FENTANYL CITRATE (PF) 100 MCG/2ML IJ SOLN
INTRAMUSCULAR | Status: AC
  Filled 2018-10-11: qty 2

## 2018-10-11 MED ORDER — MIDAZOLAM HCL 2 MG/2ML IJ SOLN
INTRAMUSCULAR | Status: AC
  Filled 2018-10-11: qty 2

## 2018-10-11 NOTE — Procedures (Signed)
US guided core biopsy of left supraclavicular lymph nodes. 7 cores performed and 4 adequate specimens obtained. Minimal blood loss and no immediate complication.

## 2018-10-11 NOTE — Discharge Instructions (Addendum)
Needle Biopsy, Care After °Refer to this sheet in the next few weeks. These instructions provide you with information about caring for yourself after your procedure. Your health care provider may also give you more specific instructions. Your treatment has been planned according to current medical practices, but problems sometimes occur. Call your health care provider if you have any problems or questions after your procedure. °What can I expect after the procedure? °After your procedure, it is common to have soreness, bruising, or mild pain at the biopsy site. This should go away in a few days. °Follow these instructions at home: °· Rest as directed by your health care provider. °· Take medicines only as directed by your health care provider. °· There are many different ways to close and cover the biopsy site, including stitches (sutures), skin glue, and adhesive strips. Follow your health care provider's instructions about: °? Biopsy site care. °? Bandage (dressing) changes and removal. °? Biopsy site closure removal. °· Check your biopsy site every day for signs of infection. Watch for: °? Redness, swelling, or pain. °? Fluid, blood, or pus. °Contact a health care provider if: °· You have a fever. °· You have redness, swelling, or pain at the biopsy site that lasts longer than a few days. °· You have fluid, blood, or pus coming from the biopsy site. °· You feel nauseous. °· You vomit. °Get help right away if: °· You have shortness of breath. °· You have trouble breathing. °· You have chest pain. °· You feel dizzy or you faint. °· You have bleeding that does not stop with pressure or a bandage. °· You cough up blood. °· You have pain in your abdomen. °This information is not intended to replace advice given to you by your health care provider. Make sure you discuss any questions you have with your health care provider. °Document Released: 03/18/2015 Document Revised: 04/08/2016 Document Reviewed:  10/28/2014 °Elsevier Interactive Patient Education © 2018 [employer] °Moderate Conscious Sedation, Adult, Care After °These instructions provide you with information about caring for yourself after your procedure. Your health care provider may also give you more specific instructions. Your treatment has been planned according to current medical practices, but problems sometimes occur. Call your health care provider if you have any problems or questions after your procedure. °What can I expect after the procedure? °After your procedure, it is common: °· To feel sleepy for several hours. °· To feel clumsy and have poor balance for several hours. °· To have poor judgment for several hours. °· To vomit if you eat too soon. ° °Follow these instructions at home: °For at least 24 hours after the procedure: ° °· Do not: °? Participate in activities where you could fall or become injured. °? Drive. °? Use heavy machinery. °? Drink alcohol. °? Take sleeping pills or medicines that cause drowsiness. °? Make important decisions or sign legal documents. °? Take care of children on your own. °· Rest. °Eating and drinking °· Follow the diet recommended by your health care provider. °· If you vomit: °? Drink water, juice, or soup when you can drink without vomiting. °? Make sure you have little or no nausea before eating solid foods. °General instructions °· Have a responsible adult stay with you until you are awake and alert. °· Take over-the-counter and prescription medicines only as told by your health care provider. °· If you smoke, do not smoke without supervision. °· Keep all follow-up visits as told by your health care   provider. This is important. °Contact a health care provider if: °· You keep feeling nauseous or you keep vomiting. °· You feel light-headed. °· You develop a rash. °· You have a fever. °Get help right away if: °· You have trouble breathing. °This information is not intended to replace advice given to you  by your health care provider. Make sure you discuss any questions you have with your health care provider. °Document Released: 08/22/2013 Document Revised: 04/05/2016 Document Reviewed: 02/21/2016 °Elsevier Interactive Patient Education © 2018 [employer] ° °

## 2018-10-11 NOTE — H&P (Signed)
Chief Complaint: Patient was seen in consultation today for left supraclavicular lymph node biopsy at the request of Feng,Yan  Referring Physician(s): Feng,Yan  Supervising Physician: Markus Daft  Patient Status: [facility]  History of Present Illness: Darrell Sutton is a 57 y.o. male   Newly diagnosed esophageal cancer 09/20/18 Dr Burr Medico requesting biopsy of SCLN-- staging  PET 09/29/18: IMPRESSION: 1. Focal hypermetabolism in the distal esophagus is consistent with the patient's known malignancy. 2. Areas of ill-defined upper normal to mildly enlarged lymph nodes identified in the left supraclavicular region, left thoracic inlet, upper abdomen, and para-aortic retroperitoneal space. These areas also show stranding around the lymph nodes. Low level FDG accumulation in these lymph nodes is borderline for infectious versus neoplastic etiology. While metastatic disease is certainly a consideration, infectious/inflammatory etiology is also possible and lymphoma cannot be excluded.    Past Medical History:  Diagnosis Date  . Anxiety 2009   started with divorce  . Diabetes mellitus without complication ([place])   . Gastric ulcer    from NSAID overuse  . Hyperlipidemia   . Hypertension   . MI (myocardial infarction) ([place]) 2011   has two stents  . Obesity    highest weight in 500's    Past Surgical History:  Procedure Laterality Date  . [facility] [facility]  2010  . GASTRIC BYPASS  11/24/2008  . JOINT REPLACEMENT     Bilateral knee  . TONSILECTOMY/ADENOIDECTOMY WITH MYRINGOTOMY      Allergies: Penicillins  Medications: Prior to Admission medications   Medication Sig Start Date End Date Taking? Authorizing Provider  albuterol (PROAIR HFA) 108 (90 Base) MCG/ACT inhaler Inhale 2 puffs into the lungs every 6 (six) hours as needed for wheezing or shortness of breath.   Yes [provider]  albuterol (PROVENTIL) (2.5 MG/3ML) 0.083% nebulizer solution Take 2.5  mg by nebulization every 6 (six) hours as needed for wheezing or shortness of breath.   Yes [provider]  amLODipine-benazepril (LOTREL) 10-20 MG capsule Take 1 capsule by mouth daily.   Yes [provider]  aspirin EC 81 MG tablet Take 81 mg by mouth daily.   Yes [provider]  atorvastatin (LIPITOR) 20 MG tablet Take 1 tablet (20 mg total) by mouth daily. 09/11/18  Yes [name], PA  calcium carbonate (TUMS - DOSED IN MG ELEMENTAL CALCIUM) 500 MG chewable tablet Chew 2 tablets by mouth daily as needed for indigestion or heartburn.   Yes [provider]  CALCIUM CITRATE PO Take 1 tablet by mouth daily.   Yes [provider]  Cyanocobalamin (VITAMIN B-12 PO) Take 1 tablet by mouth daily.   Yes [provider]  empagliflozin (JARDIANCE) 10 MG TABS tablet Take 10 mg by mouth daily. 08/24/18  Yes [name], PA  glimepiride (AMARYL) 2 MG tablet Take 6 mg by mouth 2 (two) times daily.    Yes [provider]  hydrochlorothiazide (HYDRODIURIL) 25 MG tablet Take 25 mg by mouth daily.   Yes [provider]  liraglutide (VICTOZA) 18 MG/3ML SOPN Inject 1.8 mg into the skin daily.   Yes [provider]  metFORMIN (GLUCOPHAGE) 1000 MG tablet Take 1,000 mg by mouth 2 (two) times daily with a meal.   Yes [provider]  metoprolol tartrate (LOPRESSOR) 50 MG tablet Take 75 mg by mouth daily.   Yes [provider]  Multiple Vitamins-Minerals (CENTRUM MEN) TABS Take 2 each by mouth daily.    Yes [provider]  pantoprazole (PROTONIX) 40 MG tablet Take 40 mg by mouth daily.   Yes [provider]  PARoxetine (PAXIL) 40 MG tablet Take 40 mg by mouth every morning.   Yes [provider]     Family History  Problem Relation Age of Onset  . Hyperlipidemia Mother   . Hypertension Mother   . Hyperlipidemia Father   . Hypertension Father   . Stroke Father   . Cancer Father         NHL  . Diabetes Brother   . Heart attack Brother 3  . Hyperlipidemia Brother   . Hypertension Brother   . Heart attack Maternal Grandmother   . Heart attack Maternal Grandfather   . Early death Maternal Grandfather   . Esophageal cancer Paternal Grandmother   . Colon cancer Paternal Grandfather   . Colon cancer Cousin   . Stomach cancer Neg Hx     Social History   Socioeconomic History  . Marital status: Married    Spouse name: Not on file  . Number of children: 3  . Years of education: Not on file  . Highest education level: Not on file  Occupational History  . Not on file  Social Needs  . Financial resource strain: Not on file  . Food insecurity:    Worry: Not on file    Inability: Not on file  . Transportation needs:    Medical: No    Non-medical: No  Tobacco Use  . Smoking status: Never Smoker  . Smokeless tobacco: Never Used  Substance and Sexual Activity  . Alcohol use: Never    Frequency: Never  . Drug use: Not Currently    Types: Marijuana    Comment: Youth years  . Sexual activity: Not on file  Lifestyle  . Physical activity:    Days per week: Not on file    Minutes per session: Not on file  . Stress: Not on file  Relationships  . Social connections:    Talks on phone: Not on file    Gets together: Not on file    Attends religious service: Not on file    Active member of club or organization: Not on file    Attends meetings of clubs or organizations: Not on file    Relationship status: Not on file  Other Topics Concern  . Not on file  Social History Narrative   From [place], moved here Sep 2019 with his wife and son   Son with autism, starting at [employer]: A 12 point ROS discussed and pertinent positives are indicated in the HPI above.  All other systems are negative.  Review of Systems  Constitutional: Positive for unexpected weight change. Negative for activity change and fever.  Respiratory: Negative  for cough and shortness of breath.   Cardiovascular: Negative for chest pain.  Gastrointestinal: Negative for abdominal pain.  Musculoskeletal: Negative for back pain.  Psychiatric/Behavioral: Negative for behavioral problems and confusion.    Vital Signs: BP (!) 176/115   Pulse 92   Temp 99.2 F (37.3 C) (Oral)   Ht 6' (1.829 m)   Wt (!) 317 lb (143.8 kg)   SpO2 97%   BMI 42.99 kg/m   Physical Exam  Constitutional: He is oriented to person, place, and time.  Cardiovascular: Normal rate, regular rhythm and normal heart sounds.  Pulmonary/Chest: Effort normal and breath sounds normal.  Abdominal: Soft. Bowel sounds are normal.  Musculoskeletal: Normal range of motion.  Neurological: He is alert and oriented to person, place, and time.  Skin: Skin is warm and dry.  Psychiatric: He has a normal mood and affect. His behavior is normal. Judgment and thought content normal.  Vitals reviewed.   Imaging: Nm Pet Image Initial (pi) Skull Base To Thigh  Result Date: 09/30/2018 CLINICAL DATA:  Initial treatment strategy for esophageal cancer. EXAM: NUCLEAR MEDICINE PET SKULL BASE TO THIGH TECHNIQUE: 16.4 mCi F-18 FDG was injected intravenously. Full-ring PET imaging was performed from the skull base to thigh after the radiotracer. CT data was obtained and used for attenuation correction and anatomic localization. Fasting blood glucose: 217 mg/dl COMPARISON:  CT scan 08/22/2018. FINDINGS: Mediastinal blood pool activity: SUV max 2.7 NECK: No hypermetabolic lymph nodes in the neck. Incidental CT findings: none CHEST: Ill-defined lymphadenopathy is identified in the low left supraclavicular region tracking into the left thoracic inlet and left paraspinal mediastinum. Index left supraclavicular lymph node measures 14 mm short axis on 52/4. 16 mm short axis left paraspinal nodule identified adjacent to the esophagus on 57/4. SUV max = 3.6. 14 mm short axis subcarinal lymph node demonstrates SUV max  = 3.5. Focal hypermetabolism in the distal esophagus demonstrates SUV max = 7.2. This is in the region of circumferential distal esophageal wall thickening. Incidental CT findings: Pleuroparenchymal calcification posterior right lower lobe is associated with parenchymal scarring, similar to 08/22/2018. No overt associated hypermetabolism. ABDOMEN/PELVIS: Ill-defined para-aortic retroperitoneal lymph nodes show low level FDG uptake. Upper normal to mildly enlarged ill-defined lymph nodes are seen in the peripancreatic region and hepato duodenal ligament. Index left para-aortic lymph node (145/4) measures 12 mm with SUV max = 4.2. Low left para-aortic lymph node measuring 12 mm short axis (159/4) demonstrates SUV max = 4.1. Left common iliac node measuring 13 mm short axis (166/4) demonstrates SUV max = 3.0. typical mottled uptake in the liver parenchyma could obscure tiny metastatic foci, but no definite liver metastases evident. Scattered uptake noted in small bowel and colon, nonspecific. Incidental CT findings: Surgical changes in the stomach are compatible with gastric bypass. There is abdominal aortic atherosclerosis without aneurysm. SKELETON: Mottled marrow uptake identified, nonspecific. Incidental CT findings: none IMPRESSION: 1. Focal hypermetabolism in the distal esophagus is consistent with the patient's known malignancy. 2. Areas of ill-defined upper normal to mildly enlarged lymph nodes identified in the left supraclavicular region, left thoracic inlet, upper abdomen, and para-aortic retroperitoneal space. These areas also show stranding around the lymph nodes. Low level FDG accumulation in these lymph nodes is borderline for infectious versus neoplastic etiology. While metastatic disease is certainly a consideration, infectious/inflammatory etiology is also possible and lymphoma cannot be excluded. Electronically Signed   By: Misty Stanley M.D.   On: 09/30/2018 17:44    Labs:  CBC: Recent Labs     08/22/18 1620  WBC 8.1  HGB 13.2  HCT 47.0  PLT 466*    COAGS: No results for input(s): INR, APTT in the last 8760 hours.  BMP: Recent Labs    08/22/18 1620  NA 136  K 4.4  CL 98  CO2 26  GLUCOSE 172*  BUN 11  CALCIUM 9.5  CREATININE 0.85  GFRNONAA >60  GFRAA >60    LIVER FUNCTION TESTS: Recent Labs    08/22/18 1928  BILITOT 0.6  AST 15  ALT 13  ALKPHOS 66  PROT 7.1  ALBUMIN 3.6    TUMOR MARKERS: No results for input(s): AFPTM, CEA, CA199, CHROMGRNA  in the last 8760 hours.  Assessment and Plan:  Esophageal Ca 09/20/2018 +PET LAN Need biopsy for staging per Dr Burr Medico Now scheduled for left Supra clavicular lymph node biopsy Risks and benefits discussed with the patient including, but not limited to bleeding, infection, damage to adjacent structures or low yield requiring additional tests.  All of the patient's questions were answered, patient is agreeable to proceed. Consent signed and in chart.   Thank you for this interesting consult.  I greatly enjoyed meeting Darrell Sutton and look forward to participating in their care.  A copy of this report was sent to the requesting provider on this date.  Electronically Signed: Lavonia Drafts, [name] 10/11/2018, 11:56 AM   I spent a total of  30 Minutes   in face to face in clinical consultation, greater than 50% of which was counseling/coordinating care for Lt SCLN bx

## 2018-10-16 ENCOUNTER — Other Ambulatory Visit: Payer: Self-pay

## 2018-10-16 ENCOUNTER — Ambulatory Visit: Payer: Self-pay | Admitting: Surgery

## 2018-10-16 ENCOUNTER — Inpatient Hospital Stay: Payer: Self-pay | Attending: Hematology | Admitting: Hematology

## 2018-10-16 ENCOUNTER — Telehealth (HOSPITAL_COMMUNITY): Payer: Self-pay | Admitting: Cardiology

## 2018-10-16 ENCOUNTER — Encounter (HOSPITAL_COMMUNITY): Payer: Self-pay | Admitting: *Deleted

## 2018-10-16 ENCOUNTER — Telehealth: Payer: Self-pay | Admitting: Hematology

## 2018-10-16 DIAGNOSIS — Z9884 Bariatric surgery status: Secondary | ICD-10-CM | POA: Insufficient documentation

## 2018-10-16 DIAGNOSIS — R17 Unspecified jaundice: Secondary | ICD-10-CM | POA: Insufficient documentation

## 2018-10-16 DIAGNOSIS — Z794 Long term (current) use of insulin: Secondary | ICD-10-CM | POA: Insufficient documentation

## 2018-10-16 DIAGNOSIS — R1310 Dysphagia, unspecified: Secondary | ICD-10-CM | POA: Insufficient documentation

## 2018-10-16 DIAGNOSIS — C778 Secondary and unspecified malignant neoplasm of lymph nodes of multiple regions: Secondary | ICD-10-CM | POA: Insufficient documentation

## 2018-10-16 DIAGNOSIS — E860 Dehydration: Secondary | ICD-10-CM | POA: Insufficient documentation

## 2018-10-16 DIAGNOSIS — R110 Nausea: Secondary | ICD-10-CM | POA: Insufficient documentation

## 2018-10-16 DIAGNOSIS — Z931 Gastrostomy status: Secondary | ICD-10-CM | POA: Insufficient documentation

## 2018-10-16 DIAGNOSIS — R740 Nonspecific elevation of levels of transaminase and lactic acid dehydrogenase [LDH]: Secondary | ICD-10-CM | POA: Insufficient documentation

## 2018-10-16 DIAGNOSIS — Z7189 Other specified counseling: Secondary | ICD-10-CM

## 2018-10-16 DIAGNOSIS — C155 Malignant neoplasm of lower third of esophagus: Secondary | ICD-10-CM | POA: Insufficient documentation

## 2018-10-16 DIAGNOSIS — E1165 Type 2 diabetes mellitus with hyperglycemia: Secondary | ICD-10-CM | POA: Insufficient documentation

## 2018-10-16 DIAGNOSIS — I10 Essential (primary) hypertension: Secondary | ICD-10-CM | POA: Insufficient documentation

## 2018-10-16 DIAGNOSIS — K117 Disturbances of salivary secretion: Secondary | ICD-10-CM | POA: Insufficient documentation

## 2018-10-16 DIAGNOSIS — G4700 Insomnia, unspecified: Secondary | ICD-10-CM | POA: Insufficient documentation

## 2018-10-16 NOTE — Telephone Encounter (Signed)
New message     [facility] Pre-operative Risk Assessment    Request for surgical clearance:  1. What type of surgery is being performed? Feeding Tube Placement  2. When is this surgery scheduled? 10/17/2018  3. What type of clearance is required (medical clearance vs. Pharmacy clearance to hold med vs. Both)? medical  4. Are there any medications that need to be held prior to surgery and how long?none to be withhold   5. Practice name and name of physician performing surgery?[facility], Dr. Michael Boston   6. What is your office phone number [phone]   7.   What is your office fax number [phone]  8.   Anesthesia type (None, local, MAC, general) ? general   Maryjane Hurter 10/16/2018, 11:59 AM  _________________________________________________________________   (provider comments below)

## 2018-10-16 NOTE — Telephone Encounter (Signed)
°  Provider office calling for status of clearance, urgent request. Advised office clearance has been routed appropriately

## 2018-10-16 NOTE — Progress Notes (Signed)
[place]  [phone]) [phone] Fax:([phone]) [phone]  Clinic Follow up Note   Patient Care Team: [name], [place] as PCP - General (Physician Assistant) Truitt Merle, MD as Consulting Physician (Medical Oncology) [name] Kirke Corin, MD as Consulting Physician (Gastroenterology) [name], MD as Consulting Physician (Radiation Oncology) 10/16/2018  Chief Complaint: F/u on esophageal cancer, discuss biopsy result   SUMMARY OF ONCOLOGIC HISTORY: Oncology History   Cancer Staging Cancer of lower third of esophagus [facility]) Staging form: Esophagus - Adenocarcinoma, AJCC 8th Edition - Clinical stage from 10/11/2018: Stage IVB (cTX, cN2, pM1) - Signed by Truitt Merle, MD on 10/17/2018       Cancer of lower third of esophagus ([place])   09/20/2018 Procedure    Upper Endoscopy by Dr. [name] 09/20/18  IMPRESSION - Normal larynx. - Partially obstructing, likely malignant esophageal tumor was found in the lower third of the esophagus. Biopsied. - Gastric bypass with a normal-sized pouch and gastrojejunal anastomosis characterized by ulceration. - Normal examined jejunum.    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)    09/29/2018 PET scan    PET 09/29/18  PENDING     09/30/2018 PET scan    09/30/2018 PET IMPRESSION: 1. Focal hypermetabolism in the distal esophagus is consistent with the patient's known malignancy. 2. Areas of ill-defined upper normal to mildly enlarged lymph nodes identified in the left supraclavicular region, left thoracic inlet, upper abdomen, and para-aortic retroperitoneal space. These areas also show stranding around the lymph nodes. Low level FDG accumulation in these lymph nodes is borderline for infectious versus neoplastic etiology.  While metastatic disease is certainly a consideration, infectious/inflammatory etiology is also possible and lymphoma cannot be excluded.     10/11/2018 Imaging    10/11/2018 [place] Core Biopsy FINDINGS: Bilobed abnormal lymph node or adjacent lymph nodes in the left supraclavicular region. This bilobed lymphadenopathy was successfully biopsied. Needle position was confirmed within this lesion.  IMPRESSION: Ultrasound-guided left supraclavicular lymph node biopsy.    10/11/2018 Cancer Staging    Staging form: Esophagus - Adenocarcinoma, AJCC 8th Edition - Clinical stage from 10/11/2018: Stage IVB (cTX, cN2, pM1) - Signed by Truitt Merle, MD on 10/17/2018    10/11/2018 Pathology Results    Left supraclavicular lymph node biopsy showed metastatic adenocarcinoma.     CURRENT THERAPY   INTERVAL HISTORY: Darrell Sutton is a 57 y.o. male who is here for follow-up. Since our last visit, he had a [place] core biopsy. He is here with his wife. He is doing well. He will have a feeding tube placed tomorrow. He still has dysphagia. He eats in small bites. He is hypersalivating due to dysphagia, and he carries a bottle to spit excess saliva.  He noticed multiple "pressure points" on his back and chest.   Pertinent positives and negatives of review of systems are listed and detailed within the above HPI.   REVIEW OF SYSTEMS:   Constitutional: Denies fevers, chills or abnormal weight loss Eyes: Denies blurriness of vision Ears, nose, mouth, throat, and face: Denies mucositis or sore throat Respiratory: Denies cough, dyspnea or wheezes Cardiovascular: Denies palpitation, chest discomfort or lower extremity swelling Gastrointestinal:  Denies nausea, heartburn or change in bowel habits (+) dysphagia (+) hypersalivation  Skin: Denies abnormal skin rashes Lymphatics: Denies new  lymphadenopathy or easy bruising Neurological:Denies numbness, tingling or new weaknesses Behavioral/Psych: Mood is stable, no  new changes  All other systems were reviewed with the patient and are negative.  MEDICAL HISTORY:  Past Medical History:  Diagnosis Date  . Anxiety 2009   started with divorce  . Diabetes mellitus without complication ([place])   . Gastric ulcer    from NSAID overuse  . Hyperlipidemia   . Hypertension   . MI (myocardial infarction) ([place]) 2011   has two stents  . Obesity    highest weight in 500's    SURGICAL HISTORY: Past Surgical History:  Procedure Laterality Date  . [place] SURGERY  2010  . GASTRIC BYPASS  11/24/2008  . JOINT REPLACEMENT     Bilateral knee  . TONSILECTOMY/ADENOIDECTOMY WITH MYRINGOTOMY      I have reviewed the social history and family history with the patient and they are unchanged from previous note.  ALLERGIES:  is allergic to penicillins.  MEDICATIONS:  Current Outpatient Medications  Medication Sig Dispense Refill  . albuterol (PROAIR HFA) 108 (90 Base) MCG/ACT inhaler Inhale 2 puffs into the lungs every 6 (six) hours as needed for wheezing or shortness of breath.    Marland Kitchen albuterol (PROVENTIL) (2.5 MG/3ML) 0.083% nebulizer solution Take 2.5 mg by nebulization every 6 (six) hours as needed for wheezing or shortness of breath.    Marland Kitchen amLODipine-benazepril (LOTREL) 10-20 MG capsule Take 1 capsule by mouth daily.    Marland Kitchen aspirin EC 81 MG tablet Take 81 mg by mouth daily.    Marland Kitchen atorvastatin (LIPITOR) 20 MG tablet Take 1 tablet (20 mg total) by mouth daily. 90 tablet 3  . calcium carbonate (TUMS - DOSED IN MG ELEMENTAL CALCIUM) 500 MG chewable tablet Chew 2 tablets by mouth daily as needed for indigestion or heartburn.    Marland Kitchen CALCIUM CITRATE PO Take 1 tablet by mouth daily.    . Cyanocobalamin (VITAMIN B-12 PO) Take 1 tablet by mouth daily.    . empagliflozin (JARDIANCE) 10 MG TABS tablet Take 10 mg by mouth daily. 30 tablet 2  . glimepiride (AMARYL) 2 MG tablet Take 6 mg by mouth 2 (two) times daily.     . hydrochlorothiazide (HYDRODIURIL) 25 MG tablet Take 25 mg  by mouth daily.    Marland Kitchen liraglutide (VICTOZA) 18 MG/3ML SOPN Inject 1.8 mg into the skin daily.    . metFORMIN (GLUCOPHAGE) 1000 MG tablet Take 1,000 mg by mouth 2 (two) times daily with a meal.    . metoprolol tartrate (LOPRESSOR) 50 MG tablet Take 75 mg by mouth daily.    . Multiple Vitamins-Minerals (CENTRUM MEN) TABS Take 2 each by mouth daily.     . pantoprazole (PROTONIX) 40 MG tablet Take 40 mg by mouth daily.    Marland Kitchen PARoxetine (PAXIL) 40 MG tablet Take 40 mg by mouth every morning.     No current facility-administered medications for this visit.     PHYSICAL EXAMINATION: ECOG PERFORMANCE STATUS: 3 - Symptomatic, >50% confined to bed  Vitals:   10/16/18 1556 10/16/18 1559  BP: (!) 205/100 (!) 195/102  Pulse: 87   Resp: 20   Temp: 98.5 F (36.9 C)   SpO2: 99%    Filed Weights   10/16/18 1556  Weight: (!) 302 lb 12.8 oz (137.3 kg)    GENERAL:alert, no distress and comfortable SKIN: skin color, texture, turgor are normal, no rashes or significant lesions EYES: normal, Conjunctiva are pink and non-injected, sclera clear OROPHARYNX:no exudate, no  erythema and lips, buccal mucosa, and tongue normal (+) hypersalivation NECK: supple, thyroid normal size, non-tender, without nodularity LYMPH:  no palpable lymphadenopathy in the cervical, axillary or inguinal LUNGS: clear to auscultation and percussion with normal breathing effort HEART: regular rate & rhythm and no murmurs and no lower extremity edema ABDOMEN:abdomen soft, non-tender and normal bowel sounds Musculoskeletal:no cyanosis of digits and no clubbing  NEURO: alert & oriented x 3 with fluent speech, no focal motor/sensory deficits  LABORATORY DATA:  I have reviewed the data as listed CBC Latest Ref Rng & Units 10/11/2018 08/22/2018  WBC 4.0 - 10.5 K/uL 7.2 8.1  Hemoglobin 13.0 - 17.0 g/dL 13.6 13.2  Hematocrit 39.0 - 52.0 % 46.8 47.0  Platelets 150 - 400 K/uL 394 466(H)     CMP Latest Ref Rng & Units 08/22/2018    Glucose 70 - 99 mg/dL 172(H)  BUN 6 - 20 mg/dL 11  Creatinine 0.61 - 1.24 mg/dL 0.85  Sodium 135 - 145 mmol/L 136  Potassium 3.5 - 5.1 mmol/L 4.4  Chloride 98 - 111 mmol/L 98  CO2 22 - 32 mmol/L 26  Calcium 8.9 - 10.3 mg/dL 9.5  Total Protein 6.5 - 8.1 g/dL 7.1  Total Bilirubin 0.3 - 1.2 mg/dL 0.6  Alkaline Phos 38 - 126 U/L 66  AST 15 - 41 U/L 15  ALT 0 - 44 U/L 13      RADIOGRAPHIC STUDIES: I have personally reviewed the radiological images as listed and agreed with the findings in the report.  10/11/2018 [place] Core Biopsy FINDINGS: Bilobed abnormal lymph node or adjacent lymph nodes in the left supraclavicular region. This bilobed lymphadenopathy was successfully biopsied. Needle position was confirmed within this lesion.  IMPRESSION: Ultrasound-guided left supraclavicular lymph node biopsy.  09/30/2018 PET IMPRESSION: 1. Focal hypermetabolism in the distal esophagus is consistent with the patient's known malignancy. 2. Areas of ill-defined upper normal to mildly enlarged lymph nodes identified in the left supraclavicular region, left thoracic inlet, upper abdomen, and para-aortic retroperitoneal space. These areas also show stranding around the lymph nodes. Low level FDG accumulation in these lymph nodes is borderline for infectious versus neoplastic etiology. While metastatic disease is certainly a consideration, infectious/inflammatory etiology is also possible and lymphoma cannot be excluded.  ASSESSMENT & PLAN:  Darrell Sutton is a 57 y.o. male with history of  1. Distal esophogeal cancer, stage IV with nodes metastasis  -Diagnosed in 09/2018. He presented with dysphagia and weight loss.  -I reviewed his PET scan with pt and his wife again, which unfortunately showed diffuse adenopathy from supraclavicular nodes to retroperitoneal nodes.  Although both lymph nodes are borderline enlarged with low metabolic activities.   -I discussed his left supraclavicular  lymph node biopsy results, which confirmed metastatic adenocarcinoma.   -I do not think he is cancer is curable, given the distant nodal metastasis.  We discussed the nature course of mild static esophageal cancer, and median survival with or without treatment.   -I recommend palliative radiation for his dysphagia and chemo for overall disease control.  -I will test his tumor for MMR, HER2 and PD-L1 to see if he is  Candidate for immunotherapy or targeted therapy. -He is scheduled to have G or J feeding tube placement by Dr. Johney Maine tomorrow  -I will discuss with radiation oncologist Dr. Lisbeth Renshaw, depends on the course of the palliative radiation, I may or may not offer concurrent chemotherapy.  If the radiation is a short course, I will likely start him  on systemic chemotherapy after he she completes radiation. -I recommend FOLFOX as first line systemic therapy --Chemotherapy consent: Side effects including but does not not limited to, fatigue, nausea, vomiting, diarrhea, hair loss, neuropathy, fluid retention, renal and kidney dysfunction, neutropenic fever, needed for blood transfusion, bleeding, were discussed with patient in great detail. He agrees to proceed. -port placement by Dr. Johney Maine tomorrow, I discussed with him and he agrees  -He is scheduled to follow-up with Dr. Lisbeth Renshaw later this week, possible start radiation soon.    2. Dysphagia, Vomiting w/o nausea, Weight loss, and possible aspirations secondary to #1 -He had bypass surgery in 2010. -He met dietician Pamala Hurry, and is currently on [place] -He uses Albuterol as needed -feeding tube placement by Dr. Johney Maine tomorrow  -He has excessive saliva secretion, not able to swallow, I will give him prescription of scopolamine patch  3. Genetics -He will be referred when he establishes insurance  5. HTN, DM -On amlodipine, Glimepiride, Viztoza Metformin and Jardiance.  -f/u with Dr. Morene Rankins  6. Financial Support -He plans to apply for  medicaid and disability -Since his cancer is Stage IV, he wants to see if he can get disability benefits and medicaid. He will f/u with our SW   7. Goal of care discussion  -We again discussed the incurable nature of his cancer, and the overall poor prognosis, especially if he does not have good response to chemotherapy or progress on chemo -The patient understands the goal of care is palliative. -he is full code for now    Plan  -f/u with Dr. Lisbeth Renshaw on Thursday  -Feeding tube and port placement by Dr. Johney Maine tomorrow  -I will discuss with Dr. Lisbeth Renshaw about his treatment course and sequence  -will request his tumor to be tested for MMR, HER2 and PD-L1   No problem-specific Assessment & Plan notes found for this encounter.   No orders of the defined types were placed in this encounter.  All questions were answered. The patient knows to call the clinic with any problems, questions or concerns. No barriers to learning was detected.  I spent 30 minutes counseling the patient face to face. The total time spent in the appointment was 40 minutes and more than 50% was on counseling and review of test results  I, Noor Dweik am acting as scribe for Dr. Truitt Merle.  I have reviewed the above documentation for accuracy and completeness, and I agree with the above.      Truitt Merle, MD 10/16/2018

## 2018-10-16 NOTE — Telephone Encounter (Signed)
Scheduled appt per 1/21 sch message - pt is aware of appt date and time   

## 2018-10-16 NOTE — H&P (View-Only) (Signed)
Darrell Sutton Documented: 10/16/2018 11:27 AM Location: [facility] Patient #: [id number] DOB: December 28, 1960 Married / Language: Darrell Sutton / Race: White Male  History of Present Illness Adin Hector MD; 10/16/2018 1:34 PM) The patient is a 57 year old male who presents with esophageal cancer. Note for "Esophageal cancer": ` ` ` Patient sent for surgical consultation at the request of Dr Burr Medico  Chief Complaint: Worsening dysphagia and esophageal cancer. Request for feeding jejunostomy tube. ` ` The patient is a pleasant gentleman relocated from [place]. History of laparoscopic gastric bypass done in [place] in 2010. No other abdominal surgeries. Initially had good weight loss but then regained much of the weight back. Had significant joint arthropathies and was on a lot of NSAIDs. Developed an ulcer. Improved after getting off nonsteroidals and an antacid medication. Treated. Moved down to [place] this past summer. He noticed some unintentional weight loss. Increasing fullness and dysphagia. Progressively worsening. Establish primary care physician. Recommend go the ER for workup. No perforation or abscess. Follow-up with gastroenterology. Dr. [name] did endoscopy noting a distal esophageal ulcerative near obstructing mass. Biopsies consistent with adenocarcinoma. Mild marginal ulcer at gastrojejunostomy and gastric bypass. Has undergone multiple consultations. Mildly lightened lymph nodes on PET scan. Cervical lymph node biopsy done recently. Recommendation made for chemoradiation therapy and feeding tube. Surgical crest me by Dr. Renaee Munda for feeding tube.  Patient comes in today with his wife. He spitting up liquids intermittently. Occasionally can handle pistachios and granola bars and supplemental shakes. However cannot tolerate most full liquids. Spitting up more regularly. Has unintentionally lost some weight the past week or so. Moving his bowels  maybe twice a week due to decreased output. Can walk about 15 minutes before as stop. He does have a history of a myocardial infarction treated with a bare metal stent almost a decade ago. Not on any anticoagulation at this point. Saw Dr. Percival Spanish with cardiology to establish with them and no major concerns noted in the visit 2 months ago in October.  (Review of systems as stated in this history (HPI) or in the review of systems. Otherwise all other 12 point ROS are negative) ` ` `   Past Surgical History ([name], [place]; 10/16/2018 11:27 AM) Colon Polyp Removal - Colonoscopy Gastric Bypass Knee Surgery Bilateral. Oral Surgery Tonsillectomy Vasectomy  Diagnostic Studies History ([name], [place]; 10/16/2018 11:27 AM) Colonoscopy 5-10 years ago  Allergies ([name], [place]; 10/16/2018 11:28 AM) Penicillins Hives. Allergies Reconciled  Medication History ([name], [place]; 10/16/2018 11:31 AM) Atorvastatin Calcium (20MG  Tablet, Oral) Active. Calcium Carbonate (Oral) Specific strength unknown - Active. Glimepiride (4MG  Tablet, Oral) Active. Multi-Vitamin (Oral) Active. amLODIPine Besylate (10MG  Tablet, Oral) Active. B Complex B-12 (Oral) Active. hydroCHLOROthiazide (25MG  Tablet, Oral) Active. Victoza (18MG /3ML Soln Pen-inj, Subcutaneous) Active. metFORMIN HCl (1000MG  Tablet, Oral) Active. Metoprolol Tartrate (75MG  Tablet, Oral) Active. Protonix (40MG  Packet, Oral) Active. Paxil (40MG  Tablet, Oral) Active. Medications Reconciled  Social History ([name], [place]; 10/16/2018 11:27 AM) Alcohol use Occasional alcohol use. Caffeine use Coffee, Tea. Illicit drug use Uses daily. Tobacco use Never smoker.  Family History ([name], [place]; 10/16/2018 11:27 AM) Alcohol Abuse Brother, Son. Colon Polyps Brother, Father, Sister. Diabetes Mellitus Brother. Heart disease in male family member before age 6 Hypertension  Brother, Father, Mother.  Other Problems ([name], [place]; 10/16/2018 11:27 AM) Anxiety Disorder Arthritis Cancer Diabetes Mellitus Gastric Ulcer Gastroesophageal Reflux Disease High blood pressure Hypercholesterolemia Myocardial infarction Sleep Apnea  Review of Systems (Tanisha A. Brown RMA; 10/16/2018 11:27 AM) General Present- Fatigue and Weight Loss. Not Present- Appetite Loss, Chills, Fever, Night Sweats and Weight Gain. Skin Present- Dryness. Not Present- Change in Wart/Mole, Hives, Jaundice, New Lesions, Non-Healing Wounds, Rash and Ulcer. HEENT Present- Hoarseness and Sore Throat. Not Present- Earache, Hearing Loss, Nose Bleed, Oral Ulcers, Ringing in the Ears, Seasonal Allergies, Sinus Pain, Visual Disturbances, Wears glasses/contact lenses and Yellow Eyes. Respiratory Present- Chronic Cough. Not Present- Bloody sputum, Difficulty Breathing, Snoring and Wheezing. Breast Not Present- Breast Mass, Breast Pain, Nipple Discharge and Skin Changes. Gastrointestinal Present- Difficulty Swallowing, Nausea and Vomiting. Not Present- Abdominal Pain, Bloating, Bloody Stool, Change in Bowel Habits, Chronic diarrhea, Constipation, Excessive gas, Gets full quickly at meals, Hemorrhoids, Indigestion and Rectal Pain. Male Genitourinary Not Present- Blood in Urine, Change in Urinary Stream, Frequency, Impotence, Nocturia, Painful Urination, Urgency and Urine Leakage. Neurological Present- Weakness. Not Present- Decreased Memory, Fainting, Headaches, Numbness, Seizures, Tingling, Tremor and Trouble walking. Psychiatric Present- Anxiety and Change in Sleep Pattern. Not Present- Bipolar, Depression, Fearful and Frequent crying. Endocrine Not Present- Cold Intolerance, Excessive Hunger, Hair Changes, Heat Intolerance, Hot flashes and New Diabetes. Hematology Not Present- Blood Thinners, Easy Bruising, Excessive bleeding, Gland problems, HIV and Persistent Infections.  Vitals  (Tanisha A. Brown RMA; 10/16/2018 11:28 AM) 10/16/2018 11:27 AM Weight: 302.8 lb Height: 72in Body Surface Area: 2.54 m Body Mass Index: 41.07 kg/m  Temp.: 98.8F  Pulse: 106 (Regular)  BP: 162/98 (Sitting, Left Arm, Standard)      Physical Exam Adin Hector MD; 10/16/2018 1:34 PM)  General Mental Status-Alert. General Appearance-Not in acute distress, Not Sickly. Orientation-Oriented X3. Hydration-Well hydrated. Voice-Normal.  Integumentary Global Assessment Upon inspection and palpation of skin surfaces of the - Axillae: non-tender, no inflammation or ulceration, no drainage. and Distribution of scalp and body hair is normal. General Characteristics Temperature - normal warmth is noted.  Head and Neck Head-normocephalic, atraumatic with no lesions or palpable masses. Face Global Assessment - atraumatic, no absence of expression. Neck Global Assessment - no abnormal movements, no bruit auscultated on the right, no bruit auscultated on the left, no decreased range of motion, non-tender. Trachea-midline. Thyroid Gland Characteristics - non-tender.  Eye Eyeball - Left-Extraocular movements intact, No Nystagmus. Eyeball - Right-Extraocular movements intact, No Nystagmus. Cornea - Left-No Hazy. Cornea - Right-No Hazy. Sclera/Conjunctiva - Left-No scleral icterus, No Discharge. Sclera/Conjunctiva - Right-No scleral icterus, No Discharge. Pupil - Left-Direct reaction to light normal. Pupil - Right-Direct reaction to light normal.  ENMT Ears Pinna - Left - no drainage observed, no generalized tenderness observed. Right - no drainage observed, no generalized tenderness observed. Nose and Sinuses External Inspection of the Nose - no destructive lesion observed. Inspection of the nares - Left - quiet respiration. Right - quiet respiration. Mouth and Throat Lips - Upper Lip - no fissures observed, no pallor noted. Lower Lip - no  fissures observed, no pallor noted. Nasopharynx - no discharge present. Oral Cavity/Oropharynx - Tongue - no dryness observed. Oral Mucosa - no cyanosis observed. Hypopharynx - no evidence of airway distress observed.  Chest and Lung Exam Inspection Movements - Normal and Symmetrical. Accessory muscles - No use of accessory muscles in breathing. Palpation Palpation of the chest reveals - Non-tender. Auscultation Breath sounds - Normal and Clear.  Cardiovascular Auscultation Rhythm - Regular. Murmurs & Other Heart Sounds - Auscultation of the heart reveals - No Murmurs and No Systolic Clicks.  Abdomen Inspection Inspection of the abdomen reveals -  No Visible peristalsis and No Abnormal pulsations. Umbilicus - No Bleeding, No Urine drainage. Palpation/Percussion Palpation and Percussion of the abdomen reveal - Soft, Non Tender, No Rebound tenderness, No Rigidity (guarding) and No Cutaneous hyperesthesia. Note: Morbid obese but soft. Moderate diastases recti. Old laparoscopic incisions with no hernia. Abdomen soft. Nontender. Not distended. No umbilical or incisional hernias. No guarding.  Male Genitourinary Sexual Maturity Tanner 5 - Adult hair pattern and Adult penile size and shape.  Peripheral Vascular Upper Extremity Inspection - Left - No Cyanotic nailbeds, Not Ischemic. Right - No Cyanotic nailbeds, Not Ischemic.  Neurologic Neurologic evaluation reveals -normal attention span and ability to concentrate, able to name objects and repeat phrases. Appropriate fund of knowledge , normal sensation and normal coordination. Mental Status Affect - not angry, not paranoid. Cranial Nerves-Normal Bilaterally. Gait-Normal.  Neuropsychiatric Mental status exam performed with findings of-able to articulate well with normal speech/language, rate, volume and coherence, thought content normal with ability to perform basic computations and apply abstract reasoning and no evidence  of hallucinations, delusions, obsessions or homicidal/suicidal ideation.  Musculoskeletal Global Assessment Spine, Ribs and Pelvis - no instability, subluxation or laxity. Right Upper Extremity - no instability, subluxation or laxity.  Lymphatic Head & Neck  General Head & Neck Lymphatics: Bilateral - Description - No Localized lymphadenopathy. Axillary  General Axillary Region: Bilateral - Description - No Localized lymphadenopathy. Femoral & Inguinal  Generalized Femoral & Inguinal Lymphatics: Left - Description - No Localized lymphadenopathy. Right - Description - No Localized lymphadenopathy.    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  Newly diagnosed near obstructing esophageal cancer.  Request made for feeding jejunostomy placement. We'll try and do this urgently.  Undergoing metastatic workup. Cervical lymph node biopsy done. Due to discuss pathology with medical oncology soon. Hopefully they can be aggressive given the patient's young age. He is due to see Dr. Servando Snare with thoracic surgery that does esophagectomy in town. We will try to avoid using gastric remnant from his prior gastric bypass and got into the jejunum. If not a surgical candidate, could try and put tube into the stomach for better bolus feedings and medications.  Current Plans I recommended obtaining preoperative cardiac clearance. I am concerned about the health of the patient and the ability to tolerate the operation. Therefore, we will request clearance by cardiology to better assess operative risk & see if a reevaluation, further workup, etc is needed. Also recommendations on how medications such as for anticoagulation and blood pressure should be managed/held/restarted after surgery. The anatomy and  physiology of the digestive tract was explained. The need of nutrition to help in patient recovery and survival was discussed. Technique of placement of feeding tube through endoscopic, laparoscopic and open techniques were discussed.  Risks such as bleeding, infection, stroke, heart attack, and death were discussed. Risks of injury to other organs such as intestines were discussed. Long-term issues of catheter occlusion, leak, skin irritation, tube falling out, need for tube repositioning/replacement, and others were discussed. I noted a good likelihood this will help address the problem. Questions answered the patient agrees to proceed.  Schedule for Surgery  MECHANICAL DYSPHAGIA (R13.19) Impression: Worse dysphagia even to liquids. He needs some type of intervention soon. 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

## 2018-10-16 NOTE — H&P (Signed)
Darrell Sutton Documented: 10/16/2018 11:27 AM Location: [facility] Patient #: [id number] DOB: [birth year] Married / Language: Darrell Sutton / Race: White Male  History of Present Illness Darrell Hector MD; 10/16/2018 1:34 PM) The patient is a 57 year old male who presents with esophageal cancer. Note for "Esophageal cancer": ` ` ` Patient sent for surgical consultation at the request of Dr Burr Medico  Chief Complaint: Worsening dysphagia and esophageal cancer. Request for feeding jejunostomy tube. ` ` The patient is a pleasant gentleman relocated from [place]. History of laparoscopic gastric bypass done in [place] in 2010. No other abdominal surgeries. Initially had good weight loss but then regained much of the weight back. Had significant joint arthropathies and was on a lot of NSAIDs. Developed an ulcer. Improved after getting off nonsteroidals and an antacid medication. Treated. Moved down to [place] this past summer. He noticed some unintentional weight loss. Increasing fullness and dysphagia. Progressively worsening. Establish primary care physician. Recommend go the ER for workup. No perforation or abscess. Follow-up with gastroenterology. Dr. [name] did endoscopy noting a distal esophageal ulcerative near obstructing mass. Biopsies consistent with adenocarcinoma. Mild marginal ulcer at gastrojejunostomy and gastric bypass. Has undergone multiple consultations. Mildly lightened lymph nodes on PET scan. Cervical lymph node biopsy done recently. Recommendation made for chemoradiation therapy and feeding tube. Surgical crest me by Dr. Renaee Munda for feeding tube.  Patient comes in today with his wife. He spitting up liquids intermittently. Occasionally can handle pistachios and granola bars and supplemental shakes. However cannot tolerate most full liquids. Spitting up more regularly. Has unintentionally lost some weight the past week or so. Moving his bowels  maybe twice a week due to decreased output. Can walk about 15 minutes before as stop. He does have a history of a myocardial infarction treated with a bare metal stent almost a decade ago. Not on any anticoagulation at this point. Saw Dr. Percival Spanish with cardiology to establish with them and no major concerns noted in the visit 2 months ago in October.  (Review of systems as stated in this history (HPI) or in the review of systems. Otherwise all other 12 point ROS are negative) ` ` `   Past Surgical History ([name], [place]; 10/16/2018 11:27 AM) Colon Polyp Removal - Colonoscopy Gastric Bypass Knee Surgery Bilateral. Oral Surgery Tonsillectomy Vasectomy  Diagnostic Studies History ([name], [place]; 10/16/2018 11:27 AM) Colonoscopy 5-10 years ago  Allergies ([name], [place]; 10/16/2018 11:28 AM) Penicillins Hives. Allergies Reconciled  Medication History ([name], [place]; 10/16/2018 11:31 AM) Atorvastatin Calcium (20MG  Tablet, Oral) Active. Calcium Carbonate (Oral) Specific strength unknown - Active. Glimepiride (4MG  Tablet, Oral) Active. Multi-Vitamin (Oral) Active. amLODIPine Besylate (10MG  Tablet, Oral) Active. B Complex B-12 (Oral) Active. hydroCHLOROthiazide (25MG  Tablet, Oral) Active. Victoza (18MG /3ML Soln Pen-inj, Subcutaneous) Active. metFORMIN HCl (1000MG  Tablet, Oral) Active. Metoprolol Tartrate (75MG  Tablet, Oral) Active. Protonix (40MG  Packet, Oral) Active. Paxil (40MG  Tablet, Oral) Active. Medications Reconciled  Social History ([name], [place]; 10/16/2018 11:27 AM) Alcohol use Occasional alcohol use. Caffeine use Coffee, Tea. Illicit drug use Uses daily. Tobacco use Never smoker.  Family History ([name], [place]; 10/16/2018 11:27 AM) Alcohol Abuse Brother, Son. Colon Polyps Brother, Father, Sister. Diabetes Mellitus Brother. Heart disease in male family member before age 50 Hypertension  Brother, Father, Mother.  Other Problems ([name], [place]; 10/16/2018 11:27 AM) Anxiety Disorder Arthritis Cancer Diabetes Mellitus Gastric Ulcer Gastroesophageal Reflux Disease High blood pressure Hypercholesterolemia Myocardial infarction Sleep Apnea  Review of Systems (Tanisha A. Brown RMA; 10/16/2018 11:27 AM) General Present- Fatigue and Weight Loss. Not Present- Appetite Loss, Chills, Fever, Night Sweats and Weight Gain. Skin Present- Dryness. Not Present- Change in Wart/Mole, Hives, Jaundice, New Lesions, Non-Healing Wounds, Rash and Ulcer. HEENT Present- Hoarseness and Sore Throat. Not Present- Earache, Hearing Loss, Nose Bleed, Oral Ulcers, Ringing in the Ears, Seasonal Allergies, Sinus Pain, Visual Disturbances, Wears glasses/contact lenses and Yellow Eyes. Respiratory Present- Chronic Cough. Not Present- Bloody sputum, Difficulty Breathing, Snoring and Wheezing. Breast Not Present- Breast Mass, Breast Pain, Nipple Discharge and Skin Changes. Gastrointestinal Present- Difficulty Swallowing, Nausea and Vomiting. Not Present- Abdominal Pain, Bloating, Bloody Stool, Change in Bowel Habits, Chronic diarrhea, Constipation, Excessive gas, Gets full quickly at meals, Hemorrhoids, Indigestion and Rectal Pain. Male Genitourinary Not Present- Blood in Urine, Change in Urinary Stream, Frequency, Impotence, Nocturia, Painful Urination, Urgency and Urine Leakage. Neurological Present- Weakness. Not Present- Decreased Memory, Fainting, Headaches, Numbness, Seizures, Tingling, Tremor and Trouble walking. Psychiatric Present- Anxiety and Change in Sleep Pattern. Not Present- Bipolar, Depression, Fearful and Frequent crying. Endocrine Not Present- Cold Intolerance, Excessive Hunger, Hair Changes, Heat Intolerance, Hot flashes and New Diabetes. Hematology Not Present- Blood Thinners, Easy Bruising, Excessive bleeding, Gland problems, HIV and Persistent Infections.  Vitals  (Tanisha A. Brown RMA; 10/16/2018 11:28 AM) 10/16/2018 11:27 AM Weight: 302.8 lb Height: 72in Body Surface Area: 2.54 m Body Mass Index: 41.07 kg/m  Temp.: 98.53F  Pulse: 106 (Regular)  BP: 162/98 (Sitting, Left Arm, Standard)      Physical Exam Darrell Hector MD; 10/16/2018 1:34 PM)  General Mental Status-Alert. General Appearance-Not in acute distress, Not Sickly. Orientation-Oriented X3. Hydration-Well hydrated. Voice-Normal.  Integumentary Global Assessment Upon inspection and palpation of skin surfaces of the - Axillae: non-tender, no inflammation or ulceration, no drainage. and Distribution of scalp and body hair is normal. General Characteristics Temperature - normal warmth is noted.  Head and Neck Head-normocephalic, atraumatic with no lesions or palpable masses. Face Global Assessment - atraumatic, no absence of expression. Neck Global Assessment - no abnormal movements, no bruit auscultated on the right, no bruit auscultated on the left, no decreased range of motion, non-tender. Trachea-midline. Thyroid Gland Characteristics - non-tender.  Eye Eyeball - Left-Extraocular movements intact, No Nystagmus. Eyeball - Right-Extraocular movements intact, No Nystagmus. Cornea - Left-No Hazy. Cornea - Right-No Hazy. Sclera/Conjunctiva - Left-No scleral icterus, No Discharge. Sclera/Conjunctiva - Right-No scleral icterus, No Discharge. Pupil - Left-Direct reaction to light normal. Pupil - Right-Direct reaction to light normal.  ENMT Ears Pinna - Left - no drainage observed, no generalized tenderness observed. Right - no drainage observed, no generalized tenderness observed. Nose and Sinuses External Inspection of the Nose - no destructive lesion observed. Inspection of the nares - Left - quiet respiration. Right - quiet respiration. Mouth and Throat Lips - Upper Lip - no fissures observed, no pallor noted. Lower Lip - no  fissures observed, no pallor noted. Nasopharynx - no discharge present. Oral Cavity/Oropharynx - Tongue - no dryness observed. Oral Mucosa - no cyanosis observed. Hypopharynx - no evidence of airway distress observed.  Chest and Lung Exam Inspection Movements - Normal and Symmetrical. Accessory muscles - No use of accessory muscles in breathing. Palpation Palpation of the chest reveals - Non-tender. Auscultation Breath sounds - Normal and Clear.  Cardiovascular Auscultation Rhythm - Regular. Murmurs & Other Heart Sounds - Auscultation of the heart reveals - No Murmurs and No Systolic Clicks.  Abdomen Inspection Inspection of the abdomen reveals -  No Visible peristalsis and No Abnormal pulsations. Umbilicus - No Bleeding, No Urine drainage. Palpation/Percussion Palpation and Percussion of the abdomen reveal - Soft, Non Tender, No Rebound tenderness, No Rigidity (guarding) and No Cutaneous hyperesthesia. Note: Morbid obese but soft. Moderate diastases recti. Old laparoscopic incisions with no hernia. Abdomen soft. Nontender. Not distended. No umbilical or incisional hernias. No guarding.  Male Genitourinary Sexual Maturity Tanner 5 - Adult hair pattern and Adult penile size and shape.  Peripheral Vascular Upper Extremity Inspection - Left - No Cyanotic nailbeds, Not Ischemic. Right - No Cyanotic nailbeds, Not Ischemic.  Neurologic Neurologic evaluation reveals -normal attention span and ability to concentrate, able to name objects and repeat phrases. Appropriate fund of knowledge , normal sensation and normal coordination. Mental Status Affect - not angry, not paranoid. Cranial Nerves-Normal Bilaterally. Gait-Normal.  Neuropsychiatric Mental status exam performed with findings of-able to articulate well with normal speech/language, rate, volume and coherence, thought content normal with ability to perform basic computations and apply abstract reasoning and no evidence  of hallucinations, delusions, obsessions or homicidal/suicidal ideation.  Musculoskeletal Global Assessment Spine, Ribs and Pelvis - no instability, subluxation or laxity. Right Upper Extremity - no instability, subluxation or laxity.  Lymphatic Head & Neck  General Head & Neck Lymphatics: Bilateral - Description - No Localized lymphadenopathy. Axillary  General Axillary Region: Bilateral - Description - No Localized lymphadenopathy. Femoral & Inguinal  Generalized Femoral & Inguinal Lymphatics: Left - Description - No Localized lymphadenopathy. Right - Description - No Localized lymphadenopathy.    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  Newly diagnosed near obstructing esophageal cancer.  Request made for feeding jejunostomy placement. We'll try and do this urgently.  Undergoing metastatic workup. Cervical lymph node biopsy done. Due to discuss pathology with medical oncology soon. Hopefully they can be aggressive given the patient's young age. He is due to see Dr. Servando Snare with thoracic surgery that does esophagectomy in town. We will try to avoid using gastric remnant from his prior gastric bypass and got into the jejunum. If not a surgical candidate, could try and put tube into the stomach for better bolus feedings and medications.  Current Plans I recommended obtaining preoperative cardiac clearance. I am concerned about the health of the patient and the ability to tolerate the operation. Therefore, we will request clearance by cardiology to better assess operative risk & see if a reevaluation, further workup, etc is needed. Also recommendations on how medications such as for anticoagulation and blood pressure should be managed/held/restarted after surgery. The anatomy and  physiology of the digestive tract was explained. The need of nutrition to help in patient recovery and survival was discussed. Technique of placement of feeding tube through endoscopic, laparoscopic and open techniques were discussed.  Risks such as bleeding, infection, stroke, heart attack, and death were discussed. Risks of injury to other organs such as intestines were discussed. Long-term issues of catheter occlusion, leak, skin irritation, tube falling out, need for tube repositioning/replacement, and others were discussed. I noted a good likelihood this will help address the problem. Questions answered the patient agrees to proceed.  Schedule for Surgery  MECHANICAL DYSPHAGIA (R13.19) Impression: Worse dysphagia even to liquids. He needs some type of intervention soon. 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

## 2018-10-17 ENCOUNTER — Ambulatory Visit (HOSPITAL_COMMUNITY): Payer: Self-pay | Admitting: Anesthesiology

## 2018-10-17 ENCOUNTER — Telehealth: Payer: Self-pay | Admitting: Hematology

## 2018-10-17 ENCOUNTER — Other Ambulatory Visit: Payer: Self-pay | Admitting: Physician Assistant

## 2018-10-17 ENCOUNTER — Encounter: Payer: Self-pay | Admitting: *Deleted

## 2018-10-17 ENCOUNTER — Encounter (HOSPITAL_COMMUNITY)
Admission: RE | Disposition: A | Discharge status: Home / Self Care | Payer: Self-pay | Source: Home / Self Care | Attending: Surgery

## 2018-10-17 ENCOUNTER — Encounter: Payer: Self-pay | Admitting: Hematology

## 2018-10-17 ENCOUNTER — Ambulatory Visit (HOSPITAL_COMMUNITY): Payer: Self-pay

## 2018-10-17 ENCOUNTER — Other Ambulatory Visit: Payer: Self-pay

## 2018-10-17 ENCOUNTER — Encounter (HOSPITAL_COMMUNITY): Payer: Self-pay | Admitting: *Deleted

## 2018-10-17 ENCOUNTER — Inpatient Hospital Stay (HOSPITAL_COMMUNITY)
Admission: RE | Admit: 2018-10-17 | Discharge: 2018-10-19 | DRG: 375 | Disposition: A | Discharge status: Home / Self Care | Payer: Self-pay | Attending: Surgery | Admitting: Surgery

## 2018-10-17 DIAGNOSIS — C159 Malignant neoplasm of esophagus, unspecified: Secondary | ICD-10-CM | POA: Diagnosis present

## 2018-10-17 DIAGNOSIS — E7800 Pure hypercholesterolemia, unspecified: Secondary | ICD-10-CM | POA: Diagnosis present

## 2018-10-17 DIAGNOSIS — Z79899 Other long term (current) drug therapy: Secondary | ICD-10-CM

## 2018-10-17 DIAGNOSIS — R627 Adult failure to thrive: Secondary | ICD-10-CM | POA: Diagnosis present

## 2018-10-17 DIAGNOSIS — R339 Retention of urine, unspecified: Secondary | ICD-10-CM | POA: Diagnosis not present

## 2018-10-17 DIAGNOSIS — E785 Hyperlipidemia, unspecified: Secondary | ICD-10-CM | POA: Diagnosis present

## 2018-10-17 DIAGNOSIS — C779 Secondary and unspecified malignant neoplasm of lymph node, unspecified: Secondary | ICD-10-CM | POA: Diagnosis present

## 2018-10-17 DIAGNOSIS — I252 Old myocardial infarction: Secondary | ICD-10-CM

## 2018-10-17 DIAGNOSIS — C155 Malignant neoplasm of lower third of esophagus: Principal | ICD-10-CM | POA: Diagnosis present

## 2018-10-17 DIAGNOSIS — Z419 Encounter for procedure for purposes other than remedying health state, unspecified: Secondary | ICD-10-CM

## 2018-10-17 DIAGNOSIS — I2510 Atherosclerotic heart disease of native coronary artery without angina pectoris: Secondary | ICD-10-CM | POA: Diagnosis present

## 2018-10-17 DIAGNOSIS — K289 Gastrojejunal ulcer, unspecified as acute or chronic, without hemorrhage or perforation: Secondary | ICD-10-CM | POA: Diagnosis present

## 2018-10-17 DIAGNOSIS — Z9884 Bariatric surgery status: Secondary | ICD-10-CM

## 2018-10-17 DIAGNOSIS — Z934 Other artificial openings of gastrointestinal tract status: Secondary | ICD-10-CM

## 2018-10-17 DIAGNOSIS — Z880 Allergy status to penicillin: Secondary | ICD-10-CM

## 2018-10-17 DIAGNOSIS — Z823 Family history of stroke: Secondary | ICD-10-CM

## 2018-10-17 DIAGNOSIS — Z8349 Family history of other endocrine, nutritional and metabolic diseases: Secondary | ICD-10-CM

## 2018-10-17 DIAGNOSIS — K222 Esophageal obstruction: Secondary | ICD-10-CM | POA: Diagnosis present

## 2018-10-17 DIAGNOSIS — M1990 Unspecified osteoarthritis, unspecified site: Secondary | ICD-10-CM | POA: Diagnosis present

## 2018-10-17 DIAGNOSIS — Z800 Family history of malignant neoplasm of digestive organs: Secondary | ICD-10-CM

## 2018-10-17 DIAGNOSIS — Z7951 Long term (current) use of inhaled steroids: Secondary | ICD-10-CM

## 2018-10-17 DIAGNOSIS — R1319 Other dysphagia: Secondary | ICD-10-CM

## 2018-10-17 DIAGNOSIS — I10 Essential (primary) hypertension: Secondary | ICD-10-CM | POA: Diagnosis present

## 2018-10-17 DIAGNOSIS — G4730 Sleep apnea, unspecified: Secondary | ICD-10-CM | POA: Diagnosis present

## 2018-10-17 DIAGNOSIS — Z8249 Family history of ischemic heart disease and other diseases of the circulatory system: Secondary | ICD-10-CM

## 2018-10-17 DIAGNOSIS — Z8371 Family history of colonic polyps: Secondary | ICD-10-CM

## 2018-10-17 DIAGNOSIS — Z7982 Long term (current) use of aspirin: Secondary | ICD-10-CM

## 2018-10-17 DIAGNOSIS — Z833 Family history of diabetes mellitus: Secondary | ICD-10-CM

## 2018-10-17 DIAGNOSIS — Z7189 Other specified counseling: Secondary | ICD-10-CM | POA: Insufficient documentation

## 2018-10-17 DIAGNOSIS — F419 Anxiety disorder, unspecified: Secondary | ICD-10-CM | POA: Diagnosis present

## 2018-10-17 DIAGNOSIS — E1165 Type 2 diabetes mellitus with hyperglycemia: Secondary | ICD-10-CM | POA: Diagnosis present

## 2018-10-17 DIAGNOSIS — Z7984 Long term (current) use of oral hypoglycemic drugs: Secondary | ICD-10-CM

## 2018-10-17 DIAGNOSIS — R338 Other retention of urine: Secondary | ICD-10-CM

## 2018-10-17 DIAGNOSIS — E440 Moderate protein-calorie malnutrition: Secondary | ICD-10-CM

## 2018-10-17 DIAGNOSIS — Z6841 Body Mass Index (BMI) 40.0 and over, adult: Secondary | ICD-10-CM

## 2018-10-17 DIAGNOSIS — E669 Obesity, unspecified: Secondary | ICD-10-CM | POA: Diagnosis present

## 2018-10-17 DIAGNOSIS — Z96653 Presence of artificial knee joint, bilateral: Secondary | ICD-10-CM | POA: Diagnosis present

## 2018-10-17 DIAGNOSIS — Z955 Presence of coronary angioplasty implant and graft: Secondary | ICD-10-CM

## 2018-10-17 DIAGNOSIS — Z95828 Presence of other vascular implants and grafts: Secondary | ICD-10-CM

## 2018-10-17 DIAGNOSIS — Z811 Family history of alcohol abuse and dependence: Secondary | ICD-10-CM

## 2018-10-17 DIAGNOSIS — K219 Gastro-esophageal reflux disease without esophagitis: Secondary | ICD-10-CM | POA: Diagnosis present

## 2018-10-17 DIAGNOSIS — E118 Type 2 diabetes mellitus with unspecified complications: Secondary | ICD-10-CM | POA: Diagnosis present

## 2018-10-17 HISTORY — DX: Malignant (primary) neoplasm, unspecified: C80.1

## 2018-10-17 HISTORY — DX: Gastro-esophageal reflux disease without esophagitis: K21.9

## 2018-10-17 HISTORY — PX: PORTACATH PLACEMENT: SHX2246

## 2018-10-17 HISTORY — PX: GASTROJEJUNOSTOMY: SHX1697

## 2018-10-17 LAB — BASIC METABOLIC PANEL
Anion gap: 10 (ref 5–15)
BUN: 10 mg/dL (ref 6–20)
CALCIUM: 9.1 mg/dL (ref 8.9–10.3)
CO2: 27 mmol/L (ref 22–32)
Chloride: 99 mmol/L (ref 98–111)
Creatinine, Ser: 0.78 mg/dL (ref 0.61–1.24)
GFR calc Af Amer: >60 mL/min (ref >60)
GFR calc non Af Amer: >60 mL/min (ref >60)
Glucose, Bld: 264 mg/dL — ABNORMAL HIGH (ref 70–99)
Potassium: 3.6 mmol/L (ref 3.5–5.1)
Sodium: 136 mmol/L (ref 135–145)

## 2018-10-17 LAB — CBC
HCT: 47.9 % (ref 39.0–52.0)
Hemoglobin: 13.8 g/dL (ref 13.0–17.0)
MCH: 22.8 pg — ABNORMAL LOW (ref 26.0–34.0)
MCHC: 28.8 g/dL — ABNORMAL LOW (ref 30.0–36.0)
MCV: 79.3 fL — ABNORMAL LOW (ref 80.0–100.0)
Platelets: 387 10*3/uL (ref 150–400)
RBC: 6.04 MIL/uL — ABNORMAL HIGH (ref 4.22–5.81)
RDW: 14.6 % (ref 11.5–15.5)
WBC: 8.2 10*3/uL (ref 4.0–10.5)
nRBC: 0 % (ref 0.0–0.2)

## 2018-10-17 LAB — GLUCOSE, CAPILLARY
GLUCOSE-CAPILLARY: 247 mg/dL — AB (ref 70–99)
Glucose-Capillary: 275 mg/dL — ABNORMAL HIGH (ref 70–99)
Glucose-Capillary: 271 mg/dL — ABNORMAL HIGH (ref 70–99)
Glucose-Capillary: 239 mg/dL — ABNORMAL HIGH (ref 70–99)
Glucose-Capillary: 281 mg/dL — ABNORMAL HIGH (ref 70–99)

## 2018-10-17 LAB — HEMOGLOBIN A1C
HEMOGLOBIN A1C: 8.4 % — AB (ref 4.8–5.6)
Mean Plasma Glucose: 194.38 mg/dL

## 2018-10-17 SURGERY — GASTROJEJUNOSTOMY, LAPAROSCOPIC
Anesthesia: General | Laterality: Right

## 2018-10-17 MED ORDER — HYDROMORPHONE HCL 1 MG/ML IJ SOLN
INTRAMUSCULAR | Status: AC
  Filled 2018-10-17: qty 2

## 2018-10-17 MED ORDER — FENTANYL CITRATE (PF) 100 MCG/2ML IJ SOLN
INTRAMUSCULAR | Status: DC | PRN
  Administered 2018-10-17: 25 ug via INTRAVENOUS
  Administered 2018-10-17 (×2): 50 ug via INTRAVENOUS
  Administered 2018-10-17: 100 ug via INTRAVENOUS
  Administered 2018-10-17: 25 ug via INTRAVENOUS

## 2018-10-17 MED ORDER — METHOCARBAMOL 500 MG IVPB - SIMPLE MED
INTRAVENOUS | Status: AC
  Filled 2018-10-17: qty 50

## 2018-10-17 MED ORDER — LIDOCAINE 2% (20 MG/ML) 5 ML SYRINGE
INTRAMUSCULAR | Status: DC | PRN
  Administered 2018-10-17: 60 mg via INTRAVENOUS

## 2018-10-17 MED ORDER — METHOCARBAMOL 1000 MG/10ML IJ SOLN
1000.0000 mg | Freq: Four times a day (QID) | INTRAVENOUS | Status: DC | PRN
  Administered 2018-10-17: 1000 mg via INTRAVENOUS
  Filled 2018-10-17: qty 10

## 2018-10-17 MED ORDER — CLINDAMYCIN PHOSPHATE 900 MG/50ML IV SOLN
900.0000 mg | INTRAVENOUS | Status: AC
  Administered 2018-10-17: 900 mg via INTRAVENOUS

## 2018-10-17 MED ORDER — PROCHLORPERAZINE EDISYLATE 10 MG/2ML IJ SOLN
5.0000 mg | Freq: Four times a day (QID) | INTRAMUSCULAR | Status: DC | PRN
  Administered 2018-10-19: 10 mg via INTRAVENOUS
  Filled 2018-10-17: qty 2

## 2018-10-17 MED ORDER — ONDANSETRON HCL 4 MG/2ML IJ SOLN
INTRAMUSCULAR | Status: DC | PRN
  Administered 2018-10-17: 4 mg via INTRAVENOUS

## 2018-10-17 MED ORDER — PROPOFOL 10 MG/ML IV BOLUS
INTRAVENOUS | Status: AC
  Filled 2018-10-17: qty 20

## 2018-10-17 MED ORDER — SODIUM CHLORIDE 0.9 % IV SOLN
Freq: Once | INTRAVENOUS | Status: AC
  Administered 2018-10-17: 15:00:00
  Filled 2018-10-17: qty 1.2

## 2018-10-17 MED ORDER — METOPROLOL TARTRATE 5 MG/5ML IV SOLN
5.0000 mg | Freq: Four times a day (QID) | INTRAVENOUS | Status: DC | PRN
  Administered 2018-10-18: 5 mg via INTRAVENOUS
  Filled 2018-10-17: qty 5

## 2018-10-17 MED ORDER — POLYETHYLENE GLYCOL 3350 17 G PO PACK: 17.0000 g | PACK | Freq: Every day | ORAL | Status: DC | PRN

## 2018-10-17 MED ORDER — HEPARIN SOD (PORK) LOCK FLUSH 100 UNIT/ML IV SOLN
INTRAVENOUS | Status: DC | PRN
  Administered 2018-10-17: 500 [IU]

## 2018-10-17 MED ORDER — CANAGLIFLOZIN 100 MG PO TABS
100.0000 mg | ORAL_TABLET | Freq: Every day | ORAL | Status: DC
  Filled 2018-10-17: qty 1

## 2018-10-17 MED ORDER — LACTATED RINGERS IV SOLN
INTRAVENOUS | Status: DC
  Administered 2018-10-17 (×2): via INTRAVENOUS

## 2018-10-17 MED ORDER — MAGIC MOUTHWASH
15.0000 mL | Freq: Four times a day (QID) | ORAL | Status: DC | PRN
  Filled 2018-10-17: qty 15

## 2018-10-17 MED ORDER — CHLORHEXIDINE GLUCONATE CLOTH 2 % EX PADS: 6.0000 | MEDICATED_PAD | Freq: Once | CUTANEOUS | Status: DC

## 2018-10-17 MED ORDER — PAROXETINE HCL 20 MG PO TABS
40.0000 mg | ORAL_TABLET | Freq: Every day | ORAL | Status: DC
  Administered 2018-10-18 – 2018-10-19 (×2): 40 mg via ORAL
  Filled 2018-10-17 (×3): qty 2

## 2018-10-17 MED ORDER — VITAL HIGH PROTEIN PO LIQD
1000.0000 mL | ORAL | Status: DC
  Administered 2018-10-17: 1000 mL
  Filled 2018-10-17: qty 1000

## 2018-10-17 MED ORDER — ADULT MULTIVITAMIN W/MINERALS CH: 1.0000 | ORAL_TABLET | Freq: Every day | ORAL | Status: DC

## 2018-10-17 MED ORDER — GENTAMICIN SULFATE 40 MG/ML IJ SOLN
5.0000 mg/kg | INTRAVENOUS | Status: AC
  Administered 2018-10-17: 490 mg via INTRAVENOUS
  Filled 2018-10-17: qty 12.25

## 2018-10-17 MED ORDER — PHENYLEPHRINE 40 MCG/ML (10ML) SYRINGE FOR IV PUSH (FOR BLOOD PRESSURE SUPPORT)
PREFILLED_SYRINGE | INTRAVENOUS | Status: DC | PRN
  Administered 2018-10-17 (×3): 80 ug via INTRAVENOUS

## 2018-10-17 MED ORDER — ONDANSETRON HCL 4 MG/2ML IJ SOLN
4.0000 mg | Freq: Four times a day (QID) | INTRAMUSCULAR | Status: DC | PRN
  Administered 2018-10-18 – 2018-10-19 (×3): 4 mg via INTRAVENOUS
  Filled 2018-10-17 (×3): qty 2

## 2018-10-17 MED ORDER — ROCURONIUM BROMIDE 100 MG/10ML IV SOLN
INTRAVENOUS | Status: AC
  Filled 2018-10-17: qty 3

## 2018-10-17 MED ORDER — PANTOPRAZOLE SODIUM 40 MG PO TBEC
40.0000 mg | DELAYED_RELEASE_TABLET | Freq: Every day | ORAL | Status: DC
  Filled 2018-10-17: qty 1

## 2018-10-17 MED ORDER — SODIUM CHLORIDE 0.9 % IV SOLN
INTRAVENOUS | Status: DC
  Administered 2018-10-17 – 2018-10-18 (×2): via INTRAVENOUS

## 2018-10-17 MED ORDER — GLIMEPIRIDE 4 MG PO TABS
6.0000 mg | ORAL_TABLET | Freq: Two times a day (BID) | ORAL | Status: DC
  Filled 2018-10-17 (×2): qty 1

## 2018-10-17 MED ORDER — ROCURONIUM BROMIDE 10 MG/ML (PF) SYRINGE
PREFILLED_SYRINGE | INTRAVENOUS | Status: DC | PRN
  Administered 2018-10-17: 50 mg via INTRAVENOUS
  Administered 2018-10-17: 10 mg via INTRAVENOUS

## 2018-10-17 MED ORDER — PROCHLORPERAZINE MALEATE 10 MG PO TABS
10.0000 mg | ORAL_TABLET | Freq: Four times a day (QID) | ORAL | Status: DC | PRN
  Filled 2018-10-17: qty 1

## 2018-10-17 MED ORDER — METOPROLOL TARTRATE 50 MG PO TABS
75.0000 mg | ORAL_TABLET | Freq: Every day | ORAL | Status: DC
  Filled 2018-10-17: qty 1

## 2018-10-17 MED ORDER — KETOROLAC TROMETHAMINE 30 MG/ML IJ SOLN
INTRAMUSCULAR | Status: AC
  Filled 2018-10-17: qty 1

## 2018-10-17 MED ORDER — ALBUTEROL SULFATE (2.5 MG/3ML) 0.083% IN NEBU: 2.5000 mg | INHALATION_SOLUTION | Freq: Four times a day (QID) | RESPIRATORY_TRACT | Status: DC | PRN

## 2018-10-17 MED ORDER — 0.9 % SODIUM CHLORIDE (POUR BTL) OPTIME
TOPICAL | Status: DC | PRN
  Administered 2018-10-17: 1000 mL

## 2018-10-17 MED ORDER — CLINDAMYCIN PHOSPHATE 900 MG/50ML IV SOLN
INTRAVENOUS | Status: AC
  Filled 2018-10-17: qty 50

## 2018-10-17 MED ORDER — LIRAGLUTIDE 18 MG/3ML ~~LOC~~ SOPN: 1.8000 mg | PEN_INJECTOR | Freq: Every day | SUBCUTANEOUS | Status: DC

## 2018-10-17 MED ORDER — SUGAMMADEX SODIUM 500 MG/5ML IV SOLN
INTRAVENOUS | Status: DC | PRN
  Administered 2018-10-17: 300 mg via INTRAVENOUS

## 2018-10-17 MED ORDER — HEPARIN SOD (PORK) LOCK FLUSH 100 UNIT/ML IV SOLN
INTRAVENOUS | Status: AC
  Filled 2018-10-17: qty 5

## 2018-10-17 MED ORDER — INSULIN ASPART 100 UNIT/ML ~~LOC~~ SOLN
0.0000 [IU] | Freq: Every day | SUBCUTANEOUS | Status: DC
  Administered 2018-10-17: 3 [IU] via SUBCUTANEOUS

## 2018-10-17 MED ORDER — DIPHENHYDRAMINE HCL 50 MG/ML IJ SOLN: 12.5000 mg | Freq: Four times a day (QID) | INTRAMUSCULAR | Status: DC | PRN

## 2018-10-17 MED ORDER — BUPIVACAINE-EPINEPHRINE (PF) 0.25% -1:200000 IJ SOLN
INTRAMUSCULAR | Status: AC
  Filled 2018-10-17: qty 30

## 2018-10-17 MED ORDER — HYDROMORPHONE HCL 1 MG/ML IJ SOLN
0.5000 mg | INTRAMUSCULAR | Status: DC | PRN
  Administered 2018-10-17 – 2018-10-18 (×2): 1 mg via INTRAVENOUS
  Administered 2018-10-18: 0.5 mg via INTRAVENOUS
  Administered 2018-10-18 (×3): 1 mg via INTRAVENOUS
  Administered 2018-10-19: 0.5 mg via INTRAVENOUS
  Filled 2018-10-17 (×7): qty 1

## 2018-10-17 MED ORDER — PROPOFOL 10 MG/ML IV BOLUS
INTRAVENOUS | Status: DC | PRN
  Administered 2018-10-17: 150 mg via INTRAVENOUS

## 2018-10-17 MED ORDER — CENTRUM MEN PO TABS: 2.0000 | ORAL_TABLET | Freq: Every day | ORAL | Status: DC

## 2018-10-17 MED ORDER — LIP MEDEX EX OINT
1.0000 "application " | TOPICAL_OINTMENT | Freq: Two times a day (BID) | CUTANEOUS | Status: DC
  Administered 2018-10-17 – 2018-10-18 (×3): 1 via TOPICAL

## 2018-10-17 MED ORDER — ENSURE SURGERY PO LIQD
237.0000 mL | Freq: Two times a day (BID) | ORAL | Status: DC
  Administered 2018-10-17: 237 mL via ORAL
  Filled 2018-10-17 (×5): qty 237

## 2018-10-17 MED ORDER — DEXAMETHASONE SODIUM PHOSPHATE 10 MG/ML IJ SOLN
INTRAMUSCULAR | Status: DC | PRN
  Administered 2018-10-17: 4 mg via INTRAVENOUS

## 2018-10-17 MED ORDER — ENOXAPARIN SODIUM 40 MG/0.4ML ~~LOC~~ SOLN
40.0000 mg | SUBCUTANEOUS | Status: DC
  Administered 2018-10-18 – 2018-10-19 (×2): 40 mg via SUBCUTANEOUS
  Filled 2018-10-17 (×2): qty 0.4

## 2018-10-17 MED ORDER — HYDROCHLOROTHIAZIDE 25 MG PO TABS
25.0000 mg | ORAL_TABLET | Freq: Every day | ORAL | Status: DC
  Filled 2018-10-17: qty 1

## 2018-10-17 MED ORDER — INSULIN ASPART 100 UNIT/ML ~~LOC~~ SOLN
0.0000 [IU] | Freq: Three times a day (TID) | SUBCUTANEOUS | Status: DC
  Administered 2018-10-18: 7 [IU] via SUBCUTANEOUS
  Administered 2018-10-18: 4 [IU] via SUBCUTANEOUS

## 2018-10-17 MED ORDER — LACTATED RINGERS IV SOLN
1000.0000 mL | Freq: Three times a day (TID) | INTRAVENOUS | Status: DC | PRN
  Administered 2018-10-18 (×2): 1000 mL via INTRAVENOUS

## 2018-10-17 MED ORDER — ONDANSETRON 4 MG PO TBDP: 4.0000 mg | ORAL_TABLET | Freq: Four times a day (QID) | ORAL | Status: DC | PRN

## 2018-10-17 MED ORDER — SUCCINYLCHOLINE CHLORIDE 20 MG/ML IJ SOLN
INTRAMUSCULAR | Status: DC | PRN
  Administered 2018-10-17: 120 mg via INTRAVENOUS

## 2018-10-17 MED ORDER — PROMETHAZINE HCL 25 MG/ML IJ SOLN: 6.2500 mg | INTRAMUSCULAR | Status: DC | PRN

## 2018-10-17 MED ORDER — BUPIVACAINE LIPOSOME 1.3 % IJ SUSP
20.0000 mL | Freq: Once | INTRAMUSCULAR | Status: AC
  Filled 2018-10-17: qty 20

## 2018-10-17 MED ORDER — OXYCODONE HCL 5 MG PO TABS
5.0000 mg | ORAL_TABLET | ORAL | Status: DC | PRN
  Administered 2018-10-19: 5 mg via ORAL
  Filled 2018-10-17: qty 1

## 2018-10-17 MED ORDER — BUPIVACAINE-EPINEPHRINE 0.25% -1:200000 IJ SOLN
INTRAMUSCULAR | Status: DC | PRN
  Administered 2018-10-17: 60 mL

## 2018-10-17 MED ORDER — FENTANYL CITRATE (PF) 250 MCG/5ML IJ SOLN
INTRAMUSCULAR | Status: AC
  Filled 2018-10-17: qty 5

## 2018-10-17 MED ORDER — MIDAZOLAM HCL 5 MG/5ML IJ SOLN
INTRAMUSCULAR | Status: DC | PRN
  Administered 2018-10-17: 2 mg via INTRAVENOUS

## 2018-10-17 MED ORDER — ONDANSETRON HCL 4 MG/2ML IJ SOLN
INTRAMUSCULAR | Status: AC
  Filled 2018-10-17: qty 2

## 2018-10-17 MED ORDER — METHOCARBAMOL 500 MG PO TABS: 750.0000 mg | ORAL_TABLET | Freq: Four times a day (QID) | ORAL | Status: DC | PRN

## 2018-10-17 MED ORDER — SIMETHICONE 80 MG PO CHEW: 40.0000 mg | CHEWABLE_TABLET | Freq: Four times a day (QID) | ORAL | Status: DC | PRN

## 2018-10-17 MED ORDER — INSULIN ASPART 100 UNIT/ML ~~LOC~~ SOLN
5.0000 [IU] | Freq: Once | SUBCUTANEOUS | Status: AC
  Administered 2018-10-17: 5 [IU] via SUBCUTANEOUS

## 2018-10-17 MED ORDER — KETOROLAC TROMETHAMINE 30 MG/ML IJ SOLN
30.0000 mg | Freq: Once | INTRAMUSCULAR | Status: AC | PRN
  Administered 2018-10-17: 30 mg via INTRAVENOUS

## 2018-10-17 MED ORDER — MIDAZOLAM HCL 2 MG/2ML IJ SOLN
INTRAMUSCULAR | Status: AC
  Filled 2018-10-17: qty 2

## 2018-10-17 MED ORDER — HYDRALAZINE HCL 20 MG/ML IJ SOLN: 5.0000 mg | INTRAMUSCULAR | Status: DC | PRN

## 2018-10-17 MED ORDER — HYDROMORPHONE HCL 1 MG/ML IJ SOLN
0.2500 mg | INTRAMUSCULAR | Status: DC | PRN
  Administered 2018-10-17 (×3): 0.5 mg via INTRAVENOUS

## 2018-10-17 MED ORDER — BISACODYL 10 MG RE SUPP: 10.0000 mg | Freq: Every day | RECTAL | Status: DC | PRN

## 2018-10-17 MED ORDER — DIPHENHYDRAMINE HCL 12.5 MG/5ML PO ELIX: 12.5000 mg | ORAL_SOLUTION | Freq: Four times a day (QID) | ORAL | Status: DC | PRN

## 2018-10-17 SURGICAL SUPPLY — 57 items
BAG DECANTER FOR FLEXI CONT (MISCELLANEOUS) ×4 IMPLANT
BLADE SURG SZ11 CARB STEEL (BLADE) ×4 IMPLANT
CABLE HIGH FREQUENCY MONO STRZ (ELECTRODE) ×4 IMPLANT
CHLORAPREP W/TINT 26ML (MISCELLANEOUS) ×4 IMPLANT
COVER PROBE U/S 5X48 (MISCELLANEOUS) ×4 IMPLANT
COVER SURGICAL LIGHT HANDLE (MISCELLANEOUS) ×4 IMPLANT
COVER WAND RF STERILE (DRAPES) IMPLANT
DECANTER SPIKE VIAL GLASS SM (MISCELLANEOUS) ×4 IMPLANT
DRAPE C-ARM 42X120 X-RAY (DRAPES) ×4 IMPLANT
DRAPE LAPAROTOMY T 98X78 PEDS (DRAPES) ×4 IMPLANT
DRAPE UTILITY XL STRL (DRAPES) ×4 IMPLANT
DRAPE WARM FLUID 44X44 (DRAPE) ×4 IMPLANT
DRSG TEGADERM 2-3/8X2-3/4 SM (GAUZE/BANDAGES/DRESSINGS) ×4 IMPLANT
DRSG TEGADERM 4X4.75 (GAUZE/BANDAGES/DRESSINGS) ×4 IMPLANT
ELECT PENCIL ROCKER SW 15FT (MISCELLANEOUS) ×4 IMPLANT
ELECT REM PT RETURN 15FT ADLT (MISCELLANEOUS) ×4 IMPLANT
GAUZE 4X4 16PLY RFD (DISPOSABLE) ×4 IMPLANT
GAUZE SPONGE 2X2 8PLY STRL LF (GAUZE/BANDAGES/DRESSINGS) ×4 IMPLANT
GLOVE ECLIPSE 8.0 STRL XLNG CF (GLOVE) ×4 IMPLANT
GLOVE INDICATOR 8.0 STRL GRN (GLOVE) ×4 IMPLANT
GOWN STRL REUS W/TWL XL LVL3 (GOWN DISPOSABLE) ×12 IMPLANT
IRRIG SUCT STRYKERFLOW 2 WTIP (MISCELLANEOUS) ×4
IRRIGATION SUCT STRKRFLW 2 WTP (MISCELLANEOUS) ×2 IMPLANT
IV LACTATED RINGERS 1000ML (IV SOLUTION) ×4 IMPLANT
KIT BASIN OR (CUSTOM PROCEDURE TRAY) ×4 IMPLANT
KIT INTRODUCER MIC G-18 (SET/KITS/TRAYS/PACK) ×4 IMPLANT
KIT PORT POWER 8FR ISP CVUE (Port) ×4 IMPLANT
NEEDLE HYPO 22GX1.5 SAFETY (NEEDLE) ×4 IMPLANT
PACK BASIC VI WITH GOWN DISP (CUSTOM PROCEDURE TRAY) ×4 IMPLANT
PAD POSITIONING PINK XL (MISCELLANEOUS) ×4 IMPLANT
PROTECTOR NERVE ULNAR (MISCELLANEOUS) IMPLANT
SCISSORS LAP 5X35 DISP (ENDOMECHANICALS) ×4 IMPLANT
SEALER TISSUE G2 STRG ARTC 35C (ENDOMECHANICALS) IMPLANT
SLEEVE XCEL OPT CAN 5 100 (ENDOMECHANICALS) ×8 IMPLANT
SPONGE DRAIN TRACH 4X4 STRL 2S (GAUZE/BANDAGES/DRESSINGS) ×4 IMPLANT
SPONGE GAUZE 2X2 STER 10/PKG (GAUZE/BANDAGES/DRESSINGS) ×4
SUT MNCRL AB 4-0 PS2 18 (SUTURE) ×4 IMPLANT
SUT PDS AB 2-0 CT2 27 (SUTURE) ×20 IMPLANT
SUT PROLENE 0 SH 30 (SUTURE) ×12 IMPLANT
SUT PROLENE 2 0 SH DA (SUTURE) ×8 IMPLANT
SUT SILK 2 0 (SUTURE) ×2
SUT SILK 2 0 SH CR/8 (SUTURE) ×4 IMPLANT
SUT SILK 2-0 18XBRD TIE 12 (SUTURE) ×2 IMPLANT
SUT SILK 3 0 (SUTURE) ×2
SUT SILK 3 0 SH CR/8 (SUTURE) ×4 IMPLANT
SUT SILK 3-0 18XBRD TIE 12 (SUTURE) ×2 IMPLANT
SYR 10ML LL (SYRINGE) ×4 IMPLANT
SYR 20CC LL (SYRINGE) ×4 IMPLANT
TOWEL OR 17X26 10 PK STRL BLUE (TOWEL DISPOSABLE) ×4 IMPLANT
TOWEL OR NON WOVEN STRL DISP B (DISPOSABLE) ×4 IMPLANT
TRAY FOLEY MTR SLVR 16FR STAT (SET/KITS/TRAYS/PACK) IMPLANT
TRAY LAP CHOLE (CUSTOM PROCEDURE TRAY) ×4 IMPLANT
TRAY LAPAROSCOPIC (CUSTOM PROCEDURE TRAY) ×4 IMPLANT
TROCAR BLADELESS OPT 5 100 (ENDOMECHANICALS) ×4 IMPLANT
TROCAR XCEL NON-BLD 11X100MML (ENDOMECHANICALS) IMPLANT
TUBE GASTROSTOMY 18F (CATHETERS) ×4 IMPLANT
TUBING INSUF HEATED (TUBING) ×4 IMPLANT

## 2018-10-17 NOTE — Progress Notes (Signed)
START ON PATHWAY REGIMEN - Gastroesophageal     A cycle is every 14 days:     Oxaliplatin      Leucovorin      5-Fluorouracil      5-Fluorouracil   **Always confirm dose/schedule in your pharmacy ordering system**    Patient Characteristics: Distant Metastases (cM1/pM1) / Locally Recurrent Disease, Adenocarcinoma - Esophageal, GE Junction, and Gastric, First Line, HER2 Negative / Unknown Histology: Adenocarcinoma Disease Classification: Esophageal Therapeutic Status: Distant Metastases (No Additional Staging) Line of Therapy: First Line HER2 Status: Awaiting Test Results Intent of Therapy: Non-Curative / Palliative Intent, Discussed with Patient 

## 2018-10-17 NOTE — Telephone Encounter (Signed)
No los per 12/02

## 2018-10-17 NOTE — Progress Notes (Signed)
[place] Work  Holiday representative received referral form medical oncology for support regarding disability and Medicaid.  CSW has previously spoken with patient and patients wife.  CSW contacted patient at home to follow up and discuss possible servant center referral.  CSW left patient a message encouraging patient to return CSW call.    Johnnye Lana, MSW, LCSW, OSW-C Clinical Social Worker [facility] [phone]

## 2018-10-17 NOTE — Anesthesia Postprocedure Evaluation (Signed)
Anesthesia Post Note  Patient: USMAN MILLETT  Procedure(s) Performed: LAPAROSCOPIC PLACEMENT OF  LEFT FEEDING JEJUNOSTOMY TUBE (N/A ) INSERTION PORT-A-CATH WITH ULTRASOUND AND FLUORO (Right )     Patient location during evaluation: PACU Anesthesia Type: General Level of consciousness: awake and alert Pain management: pain level controlled Vital Signs Assessment: post-procedure vital signs reviewed and stable Respiratory status: spontaneous breathing, nonlabored ventilation, respiratory function stable and patient connected to nasal cannula oxygen Cardiovascular status: blood pressure returned to baseline and stable Postop Assessment: no apparent nausea or vomiting Anesthetic complications: no    Last Vitals:  Vitals:   10/17/18 1819 10/17/18 1945  BP: (!) 156/82 (!) 150/85  Pulse: 86 86  Resp: 16 14  Temp: 36.9 C 36.6 C  SpO2: 97% 98%    Last Pain:  Vitals:   10/17/18 1819  TempSrc: Oral  PainSc:                  Emily Massar S

## 2018-10-17 NOTE — Op Note (Signed)
10/17/2018  4:03 PM  PATIENT:  Darrell Sutton  57 y.o. male  Patient Care Team: [name], [place] as PCP - General (Physician Assistant) [name], MD as PCP - Cardiology (Cardiology) Truitt Merle, MD as Consulting Physician (Medical Oncology) [name], Kirke Corin, MD as Consulting Physician (Gastroenterology) [name], MD as Consulting Physician (Radiation Oncology)  PRE-OPERATIVE DIAGNOSIS:  OBSTRUCTING ESOPHAGEAL CANCER  POST-OPERATIVE DIAGNOSIS:  OBSTRUCTING ESOPHAGEAL CANCER  PROCEDURE:   LAPAROSCOPIC PLACEMENT OF FEEDING JEJUNOSTOMY TUBE INSERTION PORT-A-CATH WITH ULTRASOUND AND FLUORO  SURGEON:  Adin Hector, MD  ASSISTANT: OR Staff   ANESTHESIA:   local and general  EBL:  Total I/O In: 1500 [I.V.:1500] Out: 25 [Blood:25]  Delay start of Pharmacological VTE agent (>24hrs) due to surgical blood loss or risk of bleeding:  no  DRAINS:  NONE  SPECIMEN:  No Specimen  DISPOSITION OF SPECIMEN:  N/A  COUNTS:  YES  PLAN OF CARE: Admit for overnight observation  PATIENT DISPOSITION:  PACU - hemodynamically stable.  INDICATION: Near obstructing esophageal cancer and worsening dysphasia with unintentional weight loss.  Positive cervical lymph node.  Not felt amenable to primary resection.  Desire for feeding jejunostomy tube placement for severe malnutrition.  Desire for Port-A-Cath placement for chemotherapy.  Use of a central venous catheter for intravenous therapy was discussed.  Technique of catheter placement using ultrasound and fluoroscopy guidance was discussed.  Risks such as bleeding, infection, pneumothorax, catheter occlusion, reoperation, and other risks were discussed.   I noted a good likelihood this will help address the problem.  Questions were answered.  The patient expressed understanding & wishes to proceed.  The anatomy and physiology of the digestive tract was explained. The need of nutrition to help in patient recovery and survival was  discussed. Technique of placement of feeding tube through endoscopic, laparoscopic and open techniques were discussed.  Risks such as bleeding, infection, stroke, heart attack, injury to other organs, need for repair of tissues / organs, and death were discussed. Risks of injury to other organs such as intestines were discussed. Long-term issues of catheter occlusion, leak, skin irritation, tube falling out, need for tube repositioning/replacement, and others were discussed.  I noted a good likelihood this will help address the problem.  Questions answered the patient agrees to proceed.   OR FINDINGS: No major intra-abdominal adhesions.  Status post gastric bypass with classic antecolic gastrojejunostomy.  Gastric remnant atrophic and contracted.  Would not reach off of the retroperitoneum.  Therefore feeding jejunostomy placed  It is an 30 [place] Mickey feeding jejunostomy within the gastrojejunal limb, proximal to the jejunojejunostomy   Is an 8 [place] power port. It goes through the right internal jugular vein  DESCRIPTION:   Informed consent was confirmed.  Patient was positioned supine with arms tucked.  Abdomen was clipped prepped and draped in sterile fashion.  Surgical timeout confirmed our plan.  I placed a 5 mm port in the right upper quadrant using optical entry technique with the patient in steep reverse Trendelenburg and right side up.  Entry was clean.  I placed 5 oh ports in the left mid abdomen and supraumbilical region.  I upsized the right upper quadrant to 10 mm port and tunneled it close to the falciform ligament.  I did camera inspection.  No major intra-abdominal adhesions.  I could see the jejunum going over the colon towards a small gastric pouch consistent with his prior gastric bypass and antecolic gastrojejunostomy.  I was able to find the  gastric remnant which was rather contracted down.  It was not mobile and it would not reach off of the retroperitoneum.  Therefore I did  not feel he was a candidate for feeding gastrostomy through the gastric remnant.  I proceeded with a feeding jejunostomy.  His gastrojejunal limb had naturally dilated jejunum that easily reached towards the left upper quadrant.  I chose a region about 30 cm distal to the gastrojejunostomy.  I tacked it to the anterior abdominal wall using 2-0 PDS using laparoscopic intracorporeal suturing.  I also pulled a few of these sutures up with an Endo Close transvaginally on the lateral ends.  Did 5 stitches around the antimesenteric jejunum.  Therefore used a feeding tube dilating kit.  Placed a needle through the anterior abdominal wall into the jejunum that was tacked to the anterior abdominal wall.  I insufflated with some air and confirmed that I had intraluminal placement.  Passed a guidewire distally to the jejunal lumen and passed it easily towards the jejunojejunostomy.  I then used a dilating sheath to gradually dilate the tract to allow a 22 French peel-away sheath in place.  I took the [place] and passed through the 22 [place] sheath.  A little bit sticking with the balloon, but with some [place] as well as an intraluminal wire was able to pass it through the anterior abdominal wall into the jejunum with the tip going distally.  Peeled away the sheath and passed the rest of the jejunum down.  Distal tip went down another 15 cm.  I blew up the balloon 6 mL to help fill but not obstruct the lumen.  I secured another stitch of jejunum distally to the anterior abdominal wall such that there were circumferential PDS stitches and hexagonal pattern helping tacked the jejunum for a good seal with the peritoneum and avoid any site leaking.  I put a another PDS antimesenteric stitch on the jejunum 5 cm proximal to this and 5 cm distal this so there is a nice straight limb of jejunum.  It laid well and flushed well.  I saw no bleeding or other abnormalities.  I evacuated carbon dioxide.  I removed the ports.  I  secured the jejunostomy to the skin with 0 Prolene sutures x3.  Brought the tails up through the flash tied it down and then tied the tails around the tubing itself to help secure it as well.  Placed sterile dressings.  Port sites closed with 4 Monocryl suture.  The 10 mm dilating port had been placed obliquely and the fascial defect was smaller than my pinky, so I did not feel more aggressive closure was needed.  Next I turned attention towards the Port-A-Cath.  Neck and chest were clipped and prepped and draped in a sterile fashion. A surgical timeout confirmed our plan.  I placed a field block of local anesthesia on the neck & chest  I used the ultrasound to identify the right internal jugular vein. I entered into it using on the first venipuncture under direct ultrasound guidance. Wire was passed into the inferior vena cava by fluoroscopy.  I made an incision in the lateral infraclavicular pocket. Made a subcutaneous pocket. I tunneled the power port from the chest wound to the neck puncture site. The port was secured to the left anterior chest wall using 2-0 Prolene interrupted stitches x4. Catheter flushed well.  I used a dilator on the wire using Seldinger technique to dilate the neck tract. Then I used  a dilator with a peel away sheath.  We used fluoroscopy.  I pulled the wire back into the right atrial/superior vena cava junction.  I pulled the wire back until it was at the neck puncture site. This gave the true measurement of the intravenous segment. I cut the catheter to appropriate length. I removed the wire and dilator. The catheter was placed within the sheath. The sheath was peeled away.  Fluoroscopy confirmed the tip in the distal SVC/caval junction.  Catheter aspirated and flushed well. On final fluoro reevaluation the tip seen to be in good position in the distal SVC.    I closed the wounds using 4 Monocryl stitch & placed sterile dressings.  Postoperative chest x-ray showed no  pneumothorax and confirmed the tip was in the cavoatrial junction.  Start low rate tube feeds with training and set up home health.  Most likely go home tomorrow.  Ostomy and portacatheter are okay to use.  Adin Hector, M.D., F.A.C.S. Gastrointestinal and Minimally Invasive Surgery [facility] [address] [place], [place] [address] [phone] Main / Paging

## 2018-10-17 NOTE — Progress Notes (Addendum)
Pt reports that he is unable to take any PO medications, he will just throw them right back up. Also pt does not have any flush orders for his J-tube feedings. Notified on-call for General Surgery Dr. Nedra Hai. Dr. Ninfa Linden was unfamiliar with tube feeding flushes and said Dr. Johney Maine will have to put orders in tomorrow am for flushes and switch the medication orders to J-tube administration route.  Found the orders for tube maintenance/ flushes.

## 2018-10-17 NOTE — Telephone Encounter (Signed)
   Primary Cardiologist: [name], MD  Chart reviewed as part of pre-operative protocol coverage. Pre-op request was received yesterday afternoon. High volume of clearances have been received given patients maxing out end-of-year deductibles. Patient has history of CAD, DM, HTN, HLD, morbid obesity, normal nuc 2017 with normal EF. Patient was doing well at 08/2018 OV without specific indication for cardiac testing. RCRI is calculated at 0.9%, indicating low risk of cardiac complications - This typically infers that the patient would be at acceptable risk for the planned procedure without further cardiovascular testing as long as no interim concerning cardiac symptoms are being described. Pper our protocol we typically call each patient to ensure no change since last office visit. The patient is currently already admitted to [facility] for his procedure and phone goes right to voicemail so we are unable to provide clearance remotely at this time.   I will route to callback staff to call surgeon's office to make them aware that we reached out to patient but could not establish contact, and to contact our hospital team via cardmaster if they deem urgent clearance is still needed.  Charlie Pitter, [name] 10/17/2018, 1:18 PM

## 2018-10-17 NOTE — Anesthesia Procedure Notes (Signed)
Procedure Name: Intubation Performed by: Gean Maidens, CRNA Pre-anesthesia Checklist: Patient identified, Emergency Drugs available, Suction available, Patient being monitored and Timeout performed Patient Re-evaluated:Patient Re-evaluated prior to induction Oxygen Delivery Method: Circle system utilized Preoxygenation: Pre-oxygenation with 100% oxygen Induction Type: IV induction and Rapid sequence Laryngoscope Size: Mac and 4 Grade View: Grade I Tube type: Oral Tube size: 7.5 mm Number of attempts: 1 Airway Equipment and Method: Stylet Placement Confirmation: ETT inserted through vocal cords under direct vision,  positive ETCO2 and breath sounds checked- equal and bilateral Secured at: 23 cm Tube secured with: Tape Dental Injury: Teeth and Oropharynx as per pre-operative assessment

## 2018-10-17 NOTE — Telephone Encounter (Signed)
Darrell Sutton, CMA @ [facility], has been advised that pt couldn't be contacted in order to provide surgical clearance and if still needed, since pt is already hospitalized, to have Dr. Johney Maine contact our hospital team via cardmaster if they deem urgent clearance is still needed

## 2018-10-17 NOTE — Interval H&P Note (Signed)
History and Physical Interval Note:  10/17/2018 1:18 PM  Darrell Sutton  has presented today for surgery, with the diagnosis of [place]  The various methods of treatment have been discussed with the patient and family. After consideration of risks, benefits and other options for treatment, the patient has consented to  Procedure(s): LAPAROSCOPIC POSSIBLE OPEN PLACEMENT OF  FEEDING JEJUNOSTOMY TUBE (N/A) INSERTION PORT-A-CATH WITH ULTRASOUND AND FLUORO (N/A) as a surgical intervention .  The patient's history has been reviewed, patient examined, no change in status, stable for surgery.  I have reviewed the patient's chart and labs.  Questions were answered to the patient's satisfaction.    I have re-reviewed the the patient's records, history, medications, and allergies.  I have re-examined the patient.  I again discussed intraoperative plans and goals of post-operative recovery.  The patient agrees to proceed.  Darrell Sutton  [birth year] [id number]  Patient Care Team: [name], [place] as PCP - General (Physician Assistant) [name], MD as PCP - Cardiology (Cardiology) Truitt Merle, MD as Consulting Physician (Medical Oncology) [name] Kirke Corin, MD as Consulting Physician (Gastroenterology) [name], MD as Consulting Physician (Radiation Oncology)  Patient Active Problem List   Diagnosis Date Noted  . Cancer of lower third of esophagus ([place]) 09/26/2018    Priority: High  . 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 Protein-calorie malnutrition, moderate ([place]) 10/17/2018  . Coronary artery disease involving native coronary artery of native heart without angina pectoris 08/31/2018  . Dizziness 08/31/2018  . Type 2 diabetes mellitus with complication, without long-term current use of insulin ([place]) 08/31/2018  . Gastroesophageal reflux disease 08/24/2018  . Hiatal hernia 08/24/2018  . History of MI (myocardial infarction)  08/24/2018  . Mass of bladder 08/24/2018  . Obesity   . Hypertension   . Hyperlipidemia   . Diabetes mellitus without complication ([place])   . Anxiety 11/16/2007    Past Medical History:  Diagnosis Date  . Anxiety 2009   started with divorce  . Cancer [facility])    cancer of lower third of esophagus  . Diabetes mellitus without complication ([place])   . Gastric ulcer    from NSAID overuse  . GERD (gastroesophageal reflux disease)   . Hyperlipidemia   . Hypertension   . MI (myocardial infarction) ([place]) 2011   has two stents  . Obesity    highest weight in 500's    Past Surgical History:  Procedure Laterality Date  . [place] SURGERY  2010  . core biopsy  10/11/2018   core biopsy of lymph nodes in Interventional Radiology  . GASTRIC BYPASS  11/24/2008  . JOINT REPLACEMENT     Bilateral knee  . TONSILECTOMY/ADENOIDECTOMY WITH MYRINGOTOMY      Social History   Socioeconomic History  . Marital status: Married    Spouse name: Not on file  . Number of children: 3  . Years of education: Not on file  . Highest education level: Not on file  Occupational History  . Not on file  Social Needs  . Financial resource strain: Not on file  . Food insecurity:    Worry: Not on file    Inability: Not on file  . Transportation needs:    Medical: No    Non-medical: No  Tobacco Use  . Smoking status: Never Smoker  . Smokeless tobacco: Never Used  Substance and Sexual Activity  . Alcohol use: Never    Frequency: Never  . Drug  use: Not Currently    Types: Marijuana    Comment: Youth years- not since earlt 90's  . Sexual activity: Not on file  Lifestyle  . Physical activity:    Days per week: Not on file    Minutes per session: Not on file  . Stress: Not on file  Relationships  . Social connections:    Talks on phone: Not on file    Gets together: Not on file    Attends religious service: Not on file    Active member of club or organization: Not on file    Attends meetings of  clubs or organizations: Not on file    Relationship status: Not on file  . Intimate partner violence:    Fear of current or ex partner: Not on file    Emotionally abused: Not on file    Physically abused: Not on file    Forced sexual activity: Not on file  Other Topics Concern  . Not on file  Social History Narrative   From [place], moved here Sep 2019 with his wife and son   Son with autism, starting at [employer]       Family History  Problem Relation Age of Onset  . Hyperlipidemia Mother   . Hypertension Mother   . Hyperlipidemia Father   . Hypertension Father   . Stroke Father   . Cancer Father        NHL  . Diabetes Brother   . Heart attack Brother 32  . Hyperlipidemia Brother   . Hypertension Brother   . Heart attack Maternal Grandmother   . Heart attack Maternal Grandfather   . Early death Maternal Grandfather   . Esophageal cancer Paternal Grandmother   . Colon cancer Paternal Grandfather   . Colon cancer Cousin   . Stomach cancer Neg Hx     Medications Prior to Admission  Medication Sig Dispense Refill Last Dose  . albuterol (PROVENTIL) (2.5 MG/3ML) 0.083% nebulizer solution Take 2.5 mg by nebulization every 6 (six) hours as needed for wheezing or shortness of breath.   Past Month at Unknown time  . amLODipine-benazepril (LOTREL) 10-20 MG capsule Take 1 capsule by mouth daily.   10/16/2018 at Unknown time  . aspirin EC 81 MG tablet Take 81 mg by mouth daily.   Past Week at Unknown time  . atorvastatin (LIPITOR) 20 MG tablet Take 1 tablet (20 mg total) by mouth daily. 90 tablet 3 10/16/2018 at Unknown time  . empagliflozin (JARDIANCE) 10 MG TABS tablet Take 10 mg by mouth daily. 30 tablet 2 10/16/2018 at Unknown time  . glimepiride (AMARYL) 2 MG tablet Take 6 mg by mouth 2 (two) times daily.    10/16/2018 at Unknown time  . hydrochlorothiazide (HYDRODIURIL) 25 MG tablet Take 25 mg by mouth daily.   10/16/2018 at Unknown time  . liraglutide (VICTOZA) 18 MG/3ML SOPN  Inject 1.8 mg into the skin daily.   10/16/2018 at Unknown time  . metFORMIN (GLUCOPHAGE) 1000 MG tablet Take 1,000 mg by mouth 2 (two) times daily with a meal.   10/16/2018 at Unknown time  . metoprolol tartrate (LOPRESSOR) 50 MG tablet Take 75 mg by mouth daily.   10/16/2018 at 0900  . pantoprazole (PROTONIX) 40 MG tablet Take 40 mg by mouth daily.   Past Month at Unknown time  . PARoxetine (PAXIL) 40 MG tablet Take 40 mg by mouth every morning.   10/16/2018 at Unknown time  . albuterol (PROAIR HFA) 108 (  90 Base) MCG/ACT inhaler Inhale 2 puffs into the lungs every 6 (six) hours as needed for wheezing or shortness of breath.   Taking  . calcium carbonate (TUMS - DOSED IN MG ELEMENTAL CALCIUM) 500 MG chewable tablet Chew 2 tablets by mouth daily as needed for indigestion or heartburn.   More than a month at Unknown time  . CALCIUM CITRATE PO Take 1 tablet by mouth daily.   More than a month at Unknown time  . Cyanocobalamin (VITAMIN B-12 PO) Take 1 tablet by mouth daily.   More than a month at Unknown time  . Multiple Vitamins-Minerals (CENTRUM MEN) TABS Take 2 each by mouth daily.    More than a month at Unknown time    Current Facility-Administered Medications  Medication Dose Route Frequency Provider Last Rate Last Dose  . bupivacaine liposome (EXPAREL) 1.3 % injection 266 mg  20 mL Infiltration Once Michael [place], MD      . Chlorhexidine Gluconate Cloth 2 % PADS 6 each  6 each Topical Once Michael [place], MD      . clindamycin (CLEOCIN) 900 MG/50ML IVPB           . clindamycin (CLEOCIN) IVPB 900 mg  900 mg Intravenous On Call to OR Michael [place], MD       And  . gentamicin (GARAMYCIN) 490 mg in dextrose 5 % 100 mL IVPB  5 mg/kg (Adjusted) Intravenous On Call to OR Michael [place], MD      . heparin 6,000 Units in sodium chloride 0.9 % 500 mL irrigation   Irrigation Once Michael [place], MD      . lactated ringers infusion   Intravenous Continuous Myrtie Soman, MD 50 mL/hr at 10/17/18 1200        Allergies  Allergen Reactions  . Penicillins Hives    Has patient had a PCN reaction causing immediate rash, facial/tongue/throat swelling, SOB or lightheadedness with hypotension: Yes Has patient had a PCN reaction causing severe rash involving mucus membranes or skin necrosis: No Has patient had a PCN reaction that required hospitalization: No; was already in hosp Has patient had a PCN reaction occurring within the last 10 years: No If all of the above answers are "NO", then may proceed with Cephalosporin use.     BP (!) 163/92   Pulse 95   Temp 98.1 F (36.7 C) (Oral)   Resp 13   Ht 6' (1.829 m)   Wt 130.9 kg   SpO2 100%   BMI 39.14 kg/m   Labs: Results for orders placed or performed during the hospital encounter of 10/17/18 (from the past 48 hour(s))  Glucose, capillary     Status: Abnormal   Collection Time: 10/17/18 11:14 AM  Result Value Ref Range   Glucose-Capillary 275 (H) 70 - 99 mg/dL  Hemoglobin A1c     Status: Abnormal   Collection Time: 10/17/18 11:59 AM  Result Value Ref Range   Hgb A1c MFr Bld 8.4 (H) 4.8 - 5.6 %    Comment: (NOTE) Pre diabetes:          5.7%-6.4% Diabetes:              >6.4% Glycemic control for   <7.0% adults with diabetes    Mean Plasma Glucose 194.38 mg/dL    Comment: Performed at [facility], [place] [address] [place], [place] [address]  Basic metabolic panel     Status: Abnormal   Collection Time: 10/17/18 11:59 AM  Result Value  Ref Range   Sodium 136 135 - 145 mmol/L   Potassium 3.6 3.5 - 5.1 mmol/L   Chloride 99 98 - 111 mmol/L   CO2 27 22 - 32 mmol/L   Glucose, Bld 264 (H) 70 - 99 mg/dL   BUN 10 6 - 20 mg/dL   Creatinine, Ser 0.78 0.61 - 1.24 mg/dL   Calcium 9.1 8.9 - 10.3 mg/dL   GFR calc non Af Amer >60 >60 mL/min   GFR calc Af Amer >60 >60 mL/min   Anion gap 10 5 - 15    Comment: Performed at [facility], [place] [address] [place], [place] [address]  CBC     Status: Abnormal   Collection  Time: 10/17/18 11:59 AM  Result Value Ref Range   WBC 8.2 4.0 - 10.5 K/uL   RBC 6.04 (H) 4.22 - 5.81 MIL/uL   Hemoglobin 13.8 13.0 - 17.0 g/dL   HCT 47.9 39.0 - 52.0 %   MCV 79.3 (L) 80.0 - 100.0 fL   MCH 22.8 (L) 26.0 - 34.0 pg   MCHC 28.8 (L) 30.0 - 36.0 g/dL   RDW 14.6 11.5 - 15.5 %   Platelets 387 150 - 400 K/uL   nRBC 0.0 0.0 - 0.2 %    Comment: Performed at [facility], [place] [address] [place], [place] [address]    Imaging / Studies: Nm Pet Image Initial (pi) Skull Base To Thigh  Result Date: 09/30/2018 CLINICAL DATA:  Initial treatment strategy for esophageal cancer. EXAM: NUCLEAR MEDICINE PET SKULL BASE TO THIGH TECHNIQUE: 16.4 mCi F-18 FDG was injected intravenously. Full-ring PET imaging was performed from the skull base to thigh after the radiotracer. CT data was obtained and used for attenuation correction and anatomic localization. Fasting blood glucose: 217 mg/dl COMPARISON:  CT scan 08/22/2018. FINDINGS: Mediastinal blood pool activity: SUV max 2.7 NECK: No hypermetabolic lymph nodes in the neck. Incidental CT findings: none CHEST: Ill-defined lymphadenopathy is identified in the low left supraclavicular region tracking into the left thoracic inlet and left paraspinal mediastinum. Index left supraclavicular lymph node measures 14 mm short axis on 52/4. 16 mm short axis left paraspinal nodule identified adjacent to the esophagus on 57/4. SUV max = 3.6. 14 mm short axis subcarinal lymph node demonstrates SUV max = 3.5. Focal hypermetabolism in the distal esophagus demonstrates SUV max = 7.2. This is in the region of circumferential distal esophageal wall thickening. Incidental CT findings: Pleuroparenchymal calcification posterior right lower lobe is associated with parenchymal scarring, similar to 08/22/2018. No overt associated hypermetabolism. ABDOMEN/PELVIS: Ill-defined para-aortic retroperitoneal lymph nodes show low level FDG uptake. Upper normal to mildly  enlarged ill-defined lymph nodes are seen in the peripancreatic region and hepato duodenal ligament. Index left para-aortic lymph node (145/4) measures 12 mm with SUV max = 4.2. Low left para-aortic lymph node measuring 12 mm short axis (159/4) demonstrates SUV max = 4.1. Left common iliac node measuring 13 mm short axis (166/4) demonstrates SUV max = 3.0. typical mottled uptake in the liver parenchyma could obscure tiny metastatic foci, but no definite liver metastases evident. Scattered uptake noted in small bowel and colon, nonspecific. Incidental CT findings: Surgical changes in the stomach are compatible with gastric bypass. There is abdominal aortic atherosclerosis without aneurysm. SKELETON: Mottled marrow uptake identified, nonspecific. Incidental CT findings: none IMPRESSION: 1. Focal hypermetabolism in the distal esophagus is consistent with the patient's known malignancy. 2. Areas of ill-defined upper normal to mildly enlarged lymph nodes identified  in the left supraclavicular region, left thoracic inlet, upper abdomen, and para-aortic retroperitoneal space. These areas also show stranding around the lymph nodes. Low level FDG accumulation in these lymph nodes is borderline for infectious versus neoplastic etiology. While metastatic disease is certainly a consideration, infectious/inflammatory etiology is also possible and lymphoma cannot be excluded. Electronically Signed   By: Misty Stanley M.D.   On: 09/30/2018 17:44   [place] Core Biopsy (lymph Nodes)  Result Date: 10/11/2018 INDICATION: 57 year old with esophageal cancer and suspicious lymph nodes. Plan for ultrasound-guided biopsy of left supraclavicular lymph nodes. EXAM: ULTRASOUND-GUIDED BIOPSY OF LEFT SUPRACLAVICULAR LYMPH NODE MEDICATIONS: None. ANESTHESIA/SEDATION: Moderate (conscious) sedation was employed during this procedure. A total of Versed 2.0 mg and Fentanyl 75 mcg was administered intravenously. Moderate Sedation Time: 21 minutes.  The patient's level of consciousness and vital signs were monitored continuously by radiology nursing throughout the procedure under my direct supervision. FLUOROSCOPY TIME:  None COMPLICATIONS: None immediate. PROCEDURE: Informed written consent was obtained from the patient after a thorough discussion of the procedural risks, benefits and alternatives. All questions were addressed. A timeout was performed prior to the initiation of the procedure. Left side of the neck was evaluated with ultrasound. Abnormal lymph node/nodes in the left supraclavicular region was targeted for biopsy. The skin was prepped with chlorhexidine and sterile field was created. Skin and soft tissues anesthetized with 1% lidocaine. Using ultrasound guidance, 2 core biopsies were performed with a BioPince core device but no significant tissue was yielded. Therefore, a Bard [place] biopsy device was used. No significant tissue was obtained on the first Bard core pass. Additional core biopsies were obtained with the Bard device and 4 adequate core biopsies were obtained. Bandage placed over the puncture site. FINDINGS: Bilobed abnormal lymph node or adjacent lymph nodes in the left supraclavicular region. This bilobed lymphadenopathy was successfully biopsied. Needle position was confirmed within this lesion. IMPRESSION: Ultrasound-guided left supraclavicular lymph node biopsy. Electronically Signed   By: Markus Daft M.D.   On: 10/11/2018 17:45     .Adin Hector, M.D., F.A.C.S. Gastrointestinal and Minimally Invasive Surgery [facility] [address] [place], [place] [address] [phone] Main / Paging  10/17/2018 1:19 PM    Adin Hector

## 2018-10-17 NOTE — Anesthesia Preprocedure Evaluation (Addendum)
Anesthesia Evaluation  Patient identified by MRN, date of birth, ID band Patient awake    Reviewed: Allergy & Precautions, NPO status , Patient's Chart, lab work & pertinent test results  Airway Mallampati: II  TM Distance: >3 FB Neck ROM: Full    Dental no notable dental hx.    Pulmonary neg pulmonary ROS,    Pulmonary exam normal breath sounds clear to auscultation       Cardiovascular hypertension, + CAD, + Past MI and + Cardiac Stents  Normal cardiovascular exam Rhythm:Regular Rate:Normal     Neuro/Psych negative neurological ROS  negative psych ROS   GI/Hepatic Neg liver ROS, GERD  ,  Endo/Other  diabetes  Renal/GU negative Renal ROS  negative genitourinary   Musculoskeletal negative musculoskeletal ROS (+)   Abdominal   Peds negative pediatric ROS (+)  Hematology negative hematology ROS (+)   Anesthesia Other Findings   Reproductive/Obstetrics negative OB ROS                             Anesthesia Physical Anesthesia Plan  ASA: III  Anesthesia Plan: General   Post-op Pain Management:    Induction: Intravenous and Rapid sequence  PONV Risk Score and Plan: 2 and Ondansetron, Dexamethasone and Treatment may vary due to age or medical condition  Airway Management Planned: Oral ETT  Additional Equipment:   Intra-op Plan:   Post-operative Plan: Extubation in OR  Informed Consent: I have reviewed the patients History and Physical, chart, labs and discussed the procedure including the risks, benefits and alternatives for the proposed anesthesia with the patient or authorized representative who has indicated his/her understanding and acceptance.   Dental advisory given  Plan Discussed with: CRNA and Surgeon  Anesthesia Plan Comments:        Anesthesia Quick Evaluation

## 2018-10-17 NOTE — Transfer of Care (Signed)
Immediate Anesthesia Transfer of Care Note  Patient: Darrell Sutton  Procedure(s) Performed: LAPAROSCOPIC PLACEMENT OF  LEFT FEEDING JEJUNOSTOMY TUBE (N/A ) INSERTION PORT-A-CATH WITH ULTRASOUND AND FLUORO (Right )  Patient Location: PACU  Anesthesia Type:General  Level of Consciousness: awake, alert  and oriented  Airway & Oxygen Therapy: Patient Spontanous Breathing and Patient connected to face mask oxygen  Post-op Assessment: Report given to RN and Post -op Vital signs reviewed and stable  Post vital signs: Reviewed and stable  Last Vitals:  Vitals Value Taken Time  BP    Temp    Pulse 89 10/17/2018  3:55 PM  Resp 13 10/17/2018  3:55 PM  SpO2 100 % 10/17/2018  3:55 PM  Vitals shown include unvalidated device data.  Last Pain:  Vitals:   10/17/18 1143  TempSrc:   PainSc: 0-No pain         Complications: No apparent anesthesia complications

## 2018-10-18 ENCOUNTER — Encounter: Payer: Self-pay | Admitting: *Deleted

## 2018-10-18 ENCOUNTER — Encounter: Payer: Self-pay | Admitting: General Practice

## 2018-10-18 ENCOUNTER — Encounter (HOSPITAL_COMMUNITY): Payer: Self-pay | Admitting: Surgery

## 2018-10-18 DIAGNOSIS — E1165 Type 2 diabetes mellitus with hyperglycemia: Secondary | ICD-10-CM

## 2018-10-18 DIAGNOSIS — Z9884 Bariatric surgery status: Secondary | ICD-10-CM

## 2018-10-18 DIAGNOSIS — E118 Type 2 diabetes mellitus with unspecified complications: Secondary | ICD-10-CM

## 2018-10-18 DIAGNOSIS — C155 Malignant neoplasm of lower third of esophagus: Principal | ICD-10-CM

## 2018-10-18 DIAGNOSIS — R1319 Other dysphagia: Secondary | ICD-10-CM

## 2018-10-18 DIAGNOSIS — C799 Secondary malignant neoplasm of unspecified site: Secondary | ICD-10-CM

## 2018-10-18 DIAGNOSIS — I10 Essential (primary) hypertension: Secondary | ICD-10-CM

## 2018-10-18 DIAGNOSIS — K222 Esophageal obstruction: Secondary | ICD-10-CM

## 2018-10-18 LAB — GLUCOSE, CAPILLARY
GLUCOSE-CAPILLARY: 223 mg/dL — AB (ref 70–99)
Glucose-Capillary: 139 mg/dL — ABNORMAL HIGH (ref 70–99)
Glucose-Capillary: 176 mg/dL — ABNORMAL HIGH (ref 70–99)
Glucose-Capillary: 195 mg/dL — ABNORMAL HIGH (ref 70–99)
Glucose-Capillary: 196 mg/dL — ABNORMAL HIGH (ref 70–99)
Glucose-Capillary: 217 mg/dL — ABNORMAL HIGH (ref 70–99)

## 2018-10-18 LAB — BASIC METABOLIC PANEL
Anion gap: 10 (ref 5–15)
BUN: 11 mg/dL (ref 6–20)
CHLORIDE: 100 mmol/L (ref 98–111)
CO2: 26 mmol/L (ref 22–32)
CREATININE: 0.71 mg/dL (ref 0.61–1.24)
Calcium: 8.8 mg/dL — ABNORMAL LOW (ref 8.9–10.3)
GFR calc Af Amer: >60 mL/min (ref >60)
GFR calc non Af Amer: >60 mL/min (ref >60)
Glucose, Bld: 237 mg/dL — ABNORMAL HIGH (ref 70–99)
Potassium: 3.6 mmol/L (ref 3.5–5.1)
Sodium: 136 mmol/L (ref 135–145)

## 2018-10-18 MED ORDER — METOPROLOL TARTRATE 25 MG/10 ML ORAL SUSPENSION: 75.0000 mg | Freq: Two times a day (BID) | ORAL | 0 refills | Status: DC

## 2018-10-18 MED ORDER — JEVITY 1.2 CAL PO LIQD: 1000.0000 mL | ORAL | 2 refills | Status: DC

## 2018-10-18 MED ORDER — INSULIN GLARGINE 100 UNIT/ML ~~LOC~~ SOLN: 15.0000 [IU] | Freq: Every day | SUBCUTANEOUS | 0 refills | Status: DC

## 2018-10-18 MED ORDER — METOPROLOL TARTRATE 25 MG/10 ML ORAL SUSPENSION
75.0000 mg | Freq: Two times a day (BID) | ORAL | Status: DC
  Administered 2018-10-18 – 2018-10-19 (×3): 75 mg
  Filled 2018-10-18 (×3): qty 30

## 2018-10-18 MED ORDER — SODIUM CHLORIDE 0.9% FLUSH
3.0000 mL | Freq: Two times a day (BID) | INTRAVENOUS | Status: DC
  Administered 2018-10-18: 3 mL via INTRAVENOUS

## 2018-10-18 MED ORDER — INSULIN ASPART 100 UNIT/ML ~~LOC~~ SOLN
0.0000 [IU] | SUBCUTANEOUS | Status: DC
  Administered 2018-10-18: 3 [IU] via SUBCUTANEOUS
  Administered 2018-10-18: 4 [IU] via SUBCUTANEOUS
  Administered 2018-10-19: 7 [IU] via SUBCUTANEOUS
  Administered 2018-10-19 (×3): 4 [IU] via SUBCUTANEOUS

## 2018-10-18 MED ORDER — SODIUM CHLORIDE 0.9% FLUSH: 3.0000 mL | INTRAVENOUS | Status: DC | PRN

## 2018-10-18 MED ORDER — JEVITY 1.2 CAL PO LIQD
1000.0000 mL | ORAL | Status: DC
  Filled 2018-10-18: qty 1000

## 2018-10-18 MED ORDER — JEVITY 1.2 CAL PO LIQD
1000.0000 mL | ORAL | Status: DC
  Administered 2018-10-18: 1000 mL
  Filled 2018-10-18: qty 1000

## 2018-10-18 MED ORDER — PANTOPRAZOLE SODIUM 40 MG PO PACK: 40.0000 mg | PACK | Freq: Every day | ORAL | 2 refills | Status: DC

## 2018-10-18 MED ORDER — HYDROCHLOROTHIAZIDE 10 MG/ML ORAL SUSPENSION
25.0000 mg | Freq: Every day | ORAL | Status: DC
  Administered 2018-10-18 – 2018-10-19 (×2): 25 mg
  Filled 2018-10-18 (×3): qty 2.5

## 2018-10-18 MED ORDER — ADULT MULTIVITAMIN LIQUID CH
15.0000 mL | Freq: Every day | ORAL | Status: DC
  Administered 2018-10-18 – 2018-10-19 (×2): 15 mL via ORAL
  Filled 2018-10-18 (×2): qty 15

## 2018-10-18 MED ORDER — SYRINGE (DISPOSABLE) 3 ML MISC: 0 refills | Status: AC

## 2018-10-18 MED ORDER — INSULIN REGULAR HUMAN 100 UNIT/ML IJ SOLN: INTRAMUSCULAR | 0 refills | Status: DC

## 2018-10-18 MED ORDER — PRO-STAT SUGAR FREE PO LIQD
30.0000 mL | Freq: Two times a day (BID) | ORAL | Status: DC
  Administered 2018-10-18 – 2018-10-19 (×3): 30 mL
  Filled 2018-10-18 (×2): qty 30

## 2018-10-18 MED ORDER — INSULIN ASPART PROT & ASPART (70-30 MIX) 100 UNIT/ML ~~LOC~~ SUSP
7.0000 [IU] | Freq: Two times a day (BID) | SUBCUTANEOUS | Status: DC
  Administered 2018-10-19: 7 [IU] via SUBCUTANEOUS
  Filled 2018-10-18: qty 10

## 2018-10-18 MED ORDER — HYDROCHLOROTHIAZIDE 10 MG/ML ORAL SUSPENSION: 25.0000 mg | Freq: Every day | ORAL | 0 refills | Status: DC

## 2018-10-18 MED ORDER — LACTATED RINGERS IV BOLUS: 1000.0000 mL | Freq: Three times a day (TID) | INTRAVENOUS | Status: DC | PRN

## 2018-10-18 MED ORDER — INSULIN NPH ISOPHANE & REGULAR (70-30) 100 UNIT/ML ~~LOC~~ SUSP: 7.0000 [IU] | Freq: Two times a day (BID) | SUBCUTANEOUS | 0 refills | Status: DC

## 2018-10-18 MED ORDER — INSULIN GLARGINE 100 UNIT/ML ~~LOC~~ SOLN
10.0000 [IU] | Freq: Every day | SUBCUTANEOUS | Status: DC
  Administered 2018-10-18: 10 [IU] via SUBCUTANEOUS
  Filled 2018-10-18: qty 0.1

## 2018-10-18 MED ORDER — INSULIN GLARGINE 100 UNIT/ML ~~LOC~~ SOLN
5.0000 [IU] | Freq: Once | SUBCUTANEOUS | Status: AC
  Administered 2018-10-18: 5 [IU] via SUBCUTANEOUS
  Filled 2018-10-18: qty 0.05

## 2018-10-18 MED ORDER — SODIUM CHLORIDE 0.9 % IV SOLN: 250.0000 mL | INTRAVENOUS | Status: DC | PRN

## 2018-10-18 MED ORDER — BACITRACIN-NEOMYCIN-POLYMYXIN 400-5-5000 EX OINT
1.0000 "application " | TOPICAL_OINTMENT | Freq: Every day | CUTANEOUS | Status: DC
  Administered 2018-10-18: 1 via TOPICAL

## 2018-10-18 MED ORDER — PANTOPRAZOLE SODIUM 40 MG PO PACK
40.0000 mg | PACK | Freq: Every day | ORAL | Status: DC
  Administered 2018-10-18 – 2018-10-19 (×2): 40 mg
  Filled 2018-10-18 (×2): qty 20

## 2018-10-18 NOTE — Progress Notes (Addendum)
Darrell Sutton [id number] 08/30/1961  CARE TEAM:  PCP: [name], PA  Outpatient Care Team: Patient Care Team: [name], [place] as PCP - General (Physician Assistant) [name], MD as PCP - Cardiology (Cardiology) Truitt Merle, MD as Consulting Physician (Medical Oncology) [name], Kirke Corin, MD as Consulting Physician (Gastroenterology) [name], MD as Consulting Physician (Radiation Oncology)  Inpatient Treatment Team: Treatment Team: Attending Provider: Michael Boston, MD; Registered Nurse: Nelida Meuse, RN; Consulting Physician: Truitt Merle, MD; Consulting Physician: Tawni Millers, MD; Rounding Team: Jackelyn Knife, MD   Problem List:   Principal Problem:   Mechanical dysphagia from esophageal cancer Active Problems:   Cancer of lower third of esophagus ([place])   Obesity   Hypertension   Hyperlipidemia   Anxiety   History of MI (myocardial infarction)   Type 2 diabetes mellitus with complication, without long-term current use of insulin ([place])   Protein-calorie malnutrition, moderate ([place])   Jejunostomy tube present   Port-A-Cath in place ([facility])   Esophageal cancer ([place])   1 Day Post-Op  10/17/2018  Procedure(s): LAPAROSCOPIC PLACEMENT OF  LEFT FEEDING JEJUNOSTOMY TUBE INSERTION PORT-A-CATH WITH ULTRASOUND AND FLUORO    Assessment  OK  [facility] Stay = 0 days)  Plan:  -Jejunostomy tube care.  Feeding tube feeds.  DM poorly controlled.  HgbA1C 8.4.  Consulted internal medicine.  [facility] hospitalists.  Also pharmacy.  See if they can adjust his diabetic regimen to a more something subcutaneous/Lantus base given the patient's intolerance to swallow even pills.  Adjust other medications as possible.  I hesitate to have many crushed pills brought through the jejunostomy tube it is is much more likely to clog.  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  Most likely discharge later today if can have new medicine regimen and home health is arranged for tube feeds.  [name] aware of surgery.  Plan palliative chemoTx soon  -VTE prophylaxis- SCDs, etc  -mobilize as tolerated to help recovery  35 minutes spent in review, evaluation, examination, counseling, and coordination of care.  More than 50% of that time was spent in counseling.  10/18/2018    Subjective: (Chief complaint)  Less sore Spitting up most liquids RNs in room  Objective:  Vital signs:  Vitals:   10/17/18 1945 10/17/18 2302 10/18/18 0153 10/18/18 0530  BP: (!) 150/85 (!) 148/85 (!) 173/85 (!) 170/90  Pulse: 86 80 76 72  Resp: 14 15 14 12   Temp: 97.8 F (36.6 C)  97.9 F (36.6 C) 99.1 F (37.3 C)  TempSrc:   Oral Oral  SpO2: 98% 98% 98% 100%  Weight:      Height:        Last BM Date: 10/15/18  Intake/Output   Yesterday:  12/03 0701 - 12/04 0700 In: 4908.9 [P.O.:360; I.V.:4093.3; NG/GT:198.3; IV Piggyback:167.3] Out: 25 [Blood:25] This shift:  Total I/O In: -  Out: 750 [Urine:750]  Bowel function:  Flatus: YES  BM:  No  Drain: (No drain)   Physical Exam:  General: Pt awake/alert/oriented x4 in mild acute distress Eyes: PERRL, normal EOM.  Sclera clear.  No icterus Neuro: CN II-XII intact w/o focal sensory/motor deficits. Lymph: No head/neck/groin lymphadenopathy Psych:  No delerium/psychosis/paranoia HENT: Normocephalic, Mucus membranes moist.  No thrush Neck: Supple, No tracheal deviation Chest: No chest wall pain w good excursion.  R infraclavicular port site clean. CV:  Pulses intact.  Regular rhythm  MS: Normal AROM mjr joints.  No obvious deformity  Abdomen: Soft.  Nondistended.  Tenderness at LUQ J tube site only.  Site clean.  No evidence of peritonitis.  No incarcerated hernias.  Ext:  No deformity.  No mjr edema.  No cyanosis Skin: No petechiae / purpura  Results:   Labs: Results for orders placed or  performed during the hospital encounter of 10/17/18 (from the past 48 hour(s))  Glucose, capillary     Status: Abnormal   Collection Time: 10/17/18 11:14 AM  Result Value Ref Range   Glucose-Capillary 275 (H) 70 - 99 mg/dL  Hemoglobin A1c     Status: Abnormal   Collection Time: 10/17/18 11:59 AM  Result Value Ref Range   Hgb A1c MFr Bld 8.4 (H) 4.8 - 5.6 %    Comment: (NOTE) Pre diabetes:          5.7%-6.4% Diabetes:              >6.4% Glycemic control for   <7.0% adults with diabetes    Mean Plasma Glucose 194.38 mg/dL    Comment: Performed at [facility] [address] [place], [place] [address]  Basic metabolic panel     Status: Abnormal   Collection Time: 10/17/18 11:59 AM  Result Value Ref Range   Sodium 136 135 - 145 mmol/L   Potassium 3.6 3.5 - 5.1 mmol/L   Chloride 99 98 - 111 mmol/L   CO2 27 22 - 32 mmol/L   Glucose, Bld 264 (H) 70 - 99 mg/dL   BUN 10 6 - 20 mg/dL   Creatinine, Ser 0.78 0.61 - 1.24 mg/dL   Calcium 9.1 8.9 - 10.3 mg/dL   GFR calc non Af Amer >60 >60 mL/min   GFR calc Af Amer >60 >60 mL/min   Anion gap 10 5 - 15    Comment: Performed at [facility], [address] [place], [place] [address]  CBC     Status: Abnormal   Collection Time: 10/17/18 11:59 AM  Result Value Ref Range   WBC 8.2 4.0 - 10.5 K/uL   RBC 6.04 (H) 4.22 - 5.81 MIL/uL   Hemoglobin 13.8 13.0 - 17.0 g/dL   HCT 47.9 39.0 - 52.0 %   MCV 79.3 (L) 80.0 - 100.0 fL   MCH 22.8 (L) 26.0 - 34.0 pg   MCHC 28.8 (L) 30.0 - 36.0 g/dL   RDW 14.6 11.5 - 15.5 %   Platelets 387 150 - 400 K/uL   nRBC 0.0 0.0 - 0.2 %    Comment: Performed at [facility], [facility] [place] [address], [place], [place] [address]  Glucose, capillary     Status: Abnormal   Collection Time: 10/17/18  4:01 PM  Result Value Ref Range   Glucose-Capillary 247 (H) 70 - 99 mg/dL  Glucose, capillary     Status: Abnormal   Collection Time: 10/17/18  4:56 PM  Result Value Ref Range    Glucose-Capillary 271 (H) 70 - 99 mg/dL  Glucose, capillary     Status: Abnormal   Collection Time: 10/17/18  7:47 PM  Result Value Ref Range   Glucose-Capillary 239 (H) 70 - 99 mg/dL  Glucose, capillary     Status: Abnormal   Collection Time: 10/17/18 10:52 PM  Result Value Ref Range   Glucose-Capillary 281 (H) 70 - 99 mg/dL  Glucose, capillary     Status: Abnormal   Collection Time: 10/18/18  3:23 AM  Result  Value Ref Range   Glucose-Capillary 217 (H) 70 - 99 mg/dL  Glucose, capillary     Status: Abnormal   Collection Time: 10/18/18  7:29 AM  Result Value Ref Range   Glucose-Capillary 195 (H) 70 - 99 mg/dL    Imaging / Studies: Dg Chest 1 View  Result Date: 10/17/2018 CLINICAL DATA:  Port-A-Cath placement. EXAM: CHEST  1 VIEW Radiation exposure index: 9.91 mGy. COMPARISON:  None. FINDINGS: Five intraoperative fluoroscopic images of the chest demonstrate placement of right-sided Port-A-Cath. Distal tip appears to be in expected position of cavoatrial junction. IMPRESSION: Fluoroscopic guidance provided during placement of right-sided Port-A-Cath. Electronically Signed   By: Marijo Conception, M.D.   On: 10/17/2018 15:51   Dg C-arm 1-60 Min-no Report  Result Date: 10/17/2018 Fluoroscopy was utilized by the requesting physician.  No radiographic interpretation.    Medications / Allergies: per chart  Antibiotics: Anti-infectives (From admission, onward)   Start     Dose/Rate Route Frequency Ordered Stop   10/17/18 1300  gentamicin (GARAMYCIN) 490 mg in dextrose 5 % 100 mL IVPB     5 mg/kg  98.9 kg (Adjusted) 224.5 mL/hr over 30 Minutes Intravenous On call to O.R. 10/17/18 1127 10/17/18 1626   10/17/18 1229  clindamycin (CLEOCIN) 900 MG/50ML IVPB    Note to Pharmacy:  Waldron Session   : cabinet override      10/17/18 1229 10/17/18 1339   10/17/18 1130  clindamycin (CLEOCIN) IVPB 900 mg     900 mg 100 mL/hr over 30 Minutes Intravenous On call to O.R. 10/17/18 1127 10/17/18 1409          Note: Portions of this report may have been transcribed using voice recognition software. Every effort was made to ensure accuracy; however, inadvertent computerized transcription errors may be present.   Any transcriptional errors that result from this process are unintentional.     Adin Hector, MD, FACS, MASCRS Gastrointestinal and Minimally Invasive Surgery    [address], [place], [place] [address] [phone] Main / Paging [phone] Fax

## 2018-10-18 NOTE — Progress Notes (Signed)
[place] CSW Progress Notes  CSW [place] submitted application to [employer] assistance in applying for disability on patient's behalf.  [facility] notified that patient is currently inpatient.  Edwyna Shell, LCSW Clinical Social Worker Phone:  [phone]

## 2018-10-18 NOTE — Progress Notes (Signed)
Brief pharmacy note:  Pharmacy consulted to convert PO meds to liquid alterrnatives - has obstructing esophageal cancer & cannot take PO well. New 18Fr feeding J tube.   -HCTZ, MVI, metoprolol and pantoprazole all changed to liquid formulation - unfortunately we do not stock paroxetine liquid, tablet can be crushed - will sign off, please re-consult if needed  Dolly Rias RPh 10/18/2018, 11:49 AM Pager [phone]

## 2018-10-18 NOTE — Progress Notes (Signed)
[place] [employer]  [employer] representative spoke with patient and patients wife.  They were agreeable to a referral to [facility] to begin the application process for Social Security Disability.    Johnnye Lana, MSW, LCSW, OSW-C Clinical Social Worker [facility] [phone]

## 2018-10-18 NOTE — Progress Notes (Addendum)
Inpatient Diabetes Program Recommendations  AACE/ADA: New Consensus Statement on Inpatient Glycemic Control (2015)  Target Ranges:  Prepandial:   less than 140 mg/dL      Peak postprandial:   less than 180 mg/dL (1-2 hours)      Critically ill patients:  140 - 180 mg/dL   Lab Results  Component Value Date   GLUCAP 195 (H) 10/18/2018   HGBA1C 8.4 (H) 10/17/2018    Review of Glycemic Control  Diabetes history: type 2 Outpatient Diabetes medications: Jardiance, Victoza, Amaryl, Metformin Current orders for Inpatient glycemic control: Lantus 10 units daily, Novolog 0-20 units TID & HS  Inpatient Diabetes Program Recommendations:    Received diabetes coordinator consult. Noted that patient cannot swallow oral meds now. Do not recommend patient going back on home meds. Recommend only insulin at home.  May need to increase Lantus to 15 units daily. Recommend changing Novolog correction scale to every 4 hours if on continuous tube feedings.  Do not recommend patient going back on any of the home medications. Recommend only insulin at home.    Harvel Ricks RN BSN CDE Diabetes Coordinator Pager: [phone]  8am-5pm

## 2018-10-18 NOTE — Care Management Note (Signed)
Case Management Note  Patient Details  Name: [name] MRN: [id number] Date of Birth: Dec 02, 1960  Subjective/Objective:   Spoke with patient and spouse at bedside. Discussed cost and plan for discharge with added insulin and tube feedings. Patient and spouse feel they will be able to pay out of pocket for insulin with the [facility] recommendations from the diabetes coordinator. Discussed applying for charity for [facility] services. Patient is agreeable.They have already started the process for disability and Medicaid.                  Action/Plan: Contacted the dietician to discuss type and process for tube feedings, will need to be continuous. Contacted diabetes coordinator, see recommendations. Awaiting final charity eval from [facility].   Expected Discharge Date:                  Expected Discharge Plan:  [place]  In-House Referral:  NA  Discharge planning Services  CM Consult  Post Acute Care Choice:  Home Health Choice offered to:  Patient, Spouse  DME Arranged:  N/A DME Agency:  NA  HH Arranged:  RN, Disease Management, Social Work [employer] Agency:  [place]  Status of Service:  Completed, signed off  If discussed at [employer], dates discussed:    Additional Comments:  Guadalupe Maple, RN 10/18/2018, 1:42 PM

## 2018-10-18 NOTE — Progress Notes (Addendum)
Spoke with case manager on the phone about patient's affordability of insulin. Patient does not have health insurance right now and is having  to pay out of pocket for medications.  Lantus =greater than $250 per bottle or box of 5 insulin pens Could get a match letter for Lantus for only 30 days.   Novolog insulin pens:Novolog Sliding scale per CBGs: would be recommended for continuous tube feedings every 4 hours. Would need to cover tube feedings from 6 pm-6 am every 4 hours with Novolog if that would be the regimen chosen.   Relion Novolin [facility] 70/30 insulin is $25 per bottle or $42.88 for box of 5 insulin pens. Would NOT recommend 70/30 insulin if patient is not eating consistently. Would NOT recommend NPH if not eating consistently.  If patient were to visit [facility] and get established with appointment, he could get his Lantus insulin there for about $10. Would need to follow up with physician there.  Lantus insulin pens (Solostar) would be the best alternative if patient can afford the cost and is not eating consistently. Would NOT recommend patient going back on home medications at this time. Patient may be fine with just Lantus daily dose. Will need to follow up with a PCP as soon as possible.  Harvel Ricks RN BSN CDE Diabetes Coordinator Pager: [phone]  8am-5pm

## 2018-10-18 NOTE — Progress Notes (Addendum)
Patient is medically stable for discharge. Continue glucose control with insulin 70/30 7 units bid. Blood pressure medications: liquid hctz and metoprolol.

## 2018-10-18 NOTE — Consult Note (Signed)
Consult Note   Darrell Sutton ZOX:[id number] DOB: Sep 19, 1961 DOA: 10/17/2018  PCP: [name], PA   Reason for the consult: Hyperglycemia and uncontrolled T2DM.   HPI: Darrell Sutton is a 57 y.o. 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 He also has type 2 diabetes mellitus, dyslipidemia, hypertension, obesity and peptic ulcer disease. He was hospitalized December 3 for laparoscopic placement of feeding ileostomy tube and insertion of Port-A-Cath with ultrasound and fluoroscopy guidance.    For the last 4 to 6 weeks his glucose has been not controlled due to difficulty swallowing his medications.  His glucose used to be well controlled with empagliflozin, glimepiride, liraglutide and metformin.  His capillary glucose has been persistently uncontrolled, improved with insulin, no worsening factors, no associated polydipsia or polyuria.   Review of Systems:  1. General: No fevers, no chills, no weight gain or weight loss 2. ENT: No runny nose or sore throat, no hearing disturbances 3. Pulmonary: No dyspnea, cough, wheezing, or hemoptysis 4. Cardiovascular: No angina, claudication, lower extremity edema, pnd or orthopnea 5. Gastrointestinal: Mild nausea, no vomiting, no diarrhea or constipation 6. Hematology: No easy bruisability or frequent infections 7. Urology: No dysuria, hematuria or increased urinary frequency 8. Dermatology: No rashes. 9. Neurology: No seizures or paresthesias 10. Musculoskeletal: No joint pain or deformities  Past Medical History:  Diagnosis Date  . Anxiety 2009   started with divorce  . Cancer [facility])    cancer of lower third of esophagus  . Diabetes mellitus without complication ([place])   . Gastric ulcer    from NSAID overuse  . GERD (gastroesophageal reflux disease)   . Hyperlipidemia   . Hypertension   . MI (myocardial infarction) ([place]) 2011   has two stents  . Obesity    highest weight in 500's    Past Surgical History:  Procedure Laterality Date  . [place] SURGERY  2010  . core biopsy  10/11/2018   core biopsy of lymph nodes in Interventional Radiology  . GASTRIC BYPASS  11/24/2008  . JOINT REPLACEMENT     Bilateral knee  . TONSILECTOMY/ADENOIDECTOMY WITH MYRINGOTOMY       reports that he has never smoked. He has never used smokeless tobacco. He reports that he has current or past drug history. Drug: Marijuana. He reports that he does not drink alcohol.  Allergies  Allergen Reactions  . Penicillins Hives    Has patient had a PCN reaction causing immediate rash, facial/tongue/throat swelling, SOB or lightheadedness with hypotension: Yes Has patient had a PCN reaction causing severe rash involving mucus membranes or skin necrosis: No Has patient had a PCN reaction that required hospitalization: No; was already in hosp Has patient had a PCN reaction occurring within the last 10 years: No If all of the above answers are "NO", then may proceed with Cephalosporin use.     Family History  Problem Relation Age of Onset  . Hyperlipidemia Mother   . Hypertension Mother   . Hyperlipidemia Father   . Hypertension Father   . Stroke Father   . Cancer Father        NHL  . Diabetes Brother   . Heart attack Brother 40  . Hyperlipidemia Brother   . Hypertension Brother   . Heart attack Maternal Grandmother   . Heart attack Maternal Grandfather   . Early death Maternal Grandfather   . Esophageal cancer Paternal Grandmother   . Colon cancer Paternal Grandfather   .  Colon cancer Cousin   . Stomach cancer Neg Hx      Prior to Admission medications   Medication Sig Start Date End Date Taking? Authorizing Provider  albuterol (PROVENTIL) (2.5 MG/3ML) 0.083% nebulizer solution Take 2.5 mg by nebulization every 6 (six) hours as needed for wheezing or shortness of breath.   Yes [provider]  amLODipine-benazepril (LOTREL) 10-20 MG capsule Take  1 capsule by mouth daily.   Yes [provider]  aspirin EC 81 MG tablet Take 81 mg by mouth daily.   Yes [provider]  atorvastatin (LIPITOR) 20 MG tablet Take 1 tablet (20 mg total) by mouth daily. 09/11/18  Yes [name], PA  empagliflozin (JARDIANCE) 10 MG TABS tablet Take 10 mg by mouth daily. 08/24/18  Yes [name], PA  glimepiride (AMARYL) 2 MG tablet Take 6 mg by mouth 2 (two) times daily.    Yes [provider]  hydrochlorothiazide (HYDRODIURIL) 25 MG tablet Take 25 mg by mouth daily.   Yes [provider]  liraglutide (VICTOZA) 18 MG/3ML SOPN Inject 1.8 mg into the skin daily.   Yes [provider]  metFORMIN (GLUCOPHAGE) 1000 MG tablet Take 1,000 mg by mouth 2 (two) times daily with a meal.   Yes [provider]  metoprolol tartrate (LOPRESSOR) 50 MG tablet Take 75 mg by mouth daily.   Yes [provider]  pantoprazole (PROTONIX) 40 MG tablet Take 40 mg by mouth daily.   Yes [provider]  PARoxetine (PAXIL) 40 MG tablet Take 40 mg by mouth every morning.   Yes [provider]  albuterol (PROAIR HFA) 108 (90 Base) MCG/ACT inhaler Inhale 2 puffs into the lungs every 6 (six) hours as needed for wheezing or shortness of breath.    [provider]  calcium carbonate (TUMS - DOSED IN MG ELEMENTAL CALCIUM) 500 MG chewable tablet Chew 2 tablets by mouth daily as needed for indigestion or heartburn.    [provider]  CALCIUM CITRATE PO Take 1 tablet by mouth daily.    [provider]  Cyanocobalamin (VITAMIN B-12 PO) Take 1 tablet by mouth daily.    [provider]  Multiple Vitamins-Minerals (CENTRUM MEN) TABS Take 2 each by mouth daily.     [provider]    Physical Exam: Vitals:   10/17/18 1945 10/17/18 2302 10/18/18 0153 10/18/18 0530  BP: (!) 150/85 (!) 148/85 (!) 173/85 (!) 170/90  Pulse: 86 80 76 72  Resp: 14 15 14 12   Temp: 97.8 F  (36.6 C)  97.9 F (36.6 C) 99.1 F (37.3 C)  TempSrc:   Oral Oral  SpO2: 98% 98% 98% 100%  Weight:      Height:        Vitals:   10/17/18 1945 10/17/18 2302 10/18/18 0153 10/18/18 0530  BP: (!) 150/85 (!) 148/85 (!) 173/85 (!) 170/90  Pulse: 86 80 76 72  Resp: 14 15 14 12   Temp: 97.8 F (36.6 C)  97.9 F (36.6 C) 99.1 F (37.3 C)  TempSrc:   Oral Oral  SpO2: 98% 98% 98% 100%  Weight:      Height:       General: deconditioned, no in pain or dyspnea.  Neurology: Awake and alert, non focal Head and Neck. Head normocephalic. Neck supple with no adenopathy or thyromegaly.   E ENT: mild pallor, no icterus, oral mucosa moist Cardiovascular: No JVD. S1-S2 present, rhythmic, no gallops, rubs, or murmurs. Trace non pitting  lower extremity edema. Pulmonary: vesicular breath sounds bilaterally, adequate air movement, no wheezing, rhonchi or rales. Gastrointestinal. Abdomen with no organomegaly, non tender, no rebound or guarding/ feeding tube in place.  Skin. No rashes Musculoskeletal: no joint deformities    Labs on Admission: I have personally reviewed following labs and imaging studies  CBC: Recent Labs  Lab 10/11/18 1134 10/17/18 1159  WBC 7.2 8.2  HGB 13.6 13.8  HCT 46.8 47.9  MCV 77.2* 79.3*  PLT 394 505   Basic Metabolic Panel: Recent Labs  Lab 10/17/18 1159  NA 136  K 3.6  CL 99  CO2 27  GLUCOSE 264*  BUN 10  CREATININE 0.78  CALCIUM 9.1   GFR: Estimated Creatinine Clearance: 142.5 mL/min (by C-G formula based on SCr of 0.78 mg/dL). Liver Function Tests: No results for input(s): AST, ALT, ALKPHOS, BILITOT, PROT, ALBUMIN in the last 168 hours. No results for input(s): LIPASE, AMYLASE in the last 168 hours. No results for input(s): AMMONIA in the last 168 hours. Coagulation Profile: Recent Labs  Lab 10/11/18 1134  INR 1.08   Cardiac Enzymes: No results for input(s): CKTOTAL, CKMB, CKMBINDEX, TROPONINI in the last 168 hours. BNP (last 3  results) No results for input(s): PROBNP in the last 8760 hours. HbA1C: Recent Labs    10/17/18 1159  HGBA1C 8.4*   CBG: Recent Labs  Lab 10/17/18 1656 10/17/18 1947 10/17/18 2252 10/18/18 0323 10/18/18 0729  GLUCAP 271* 239* 281* 217* 195*   Lipid Profile: No results for input(s): CHOL, HDL, LDLCALC, TRIG, CHOLHDL, LDLDIRECT in the last 72 hours. Thyroid Function Tests: No results for input(s): TSH, T4TOTAL, FREET4, T3FREE, THYROIDAB in the last 72 hours. Anemia Panel: No results for input(s): VITAMINB12, FOLATE, FERRITIN, TIBC, IRON, RETICCTPCT in the last 72 hours. Urine analysis: No results found for: COLORURINE, APPEARANCEUR, LABSPEC, PHURINE, GLUCOSEU, HGBUR, BILIRUBINUR, KETONESUR, PROTEINUR, UROBILINOGEN, NITRITE, LEUKOCYTESUR  Radiological Exams on Admission: Dg Chest 1 View  Result Date: 10/17/2018 CLINICAL DATA:  Port-A-Cath placement. EXAM: CHEST  1 VIEW Radiation exposure index: 9.91 mGy. COMPARISON:  None. FINDINGS: Five intraoperative fluoroscopic images of the chest demonstrate placement of right-sided Port-A-Cath. Distal tip appears to be in expected position of cavoatrial junction. IMPRESSION: Fluoroscopic guidance provided during placement of right-sided Port-A-Cath. Electronically Signed   By: Marijo Conception, M.D.   On: 10/17/2018 15:51   Dg C-arm 1-60 Min-no Report  Result Date: 10/17/2018 Fluoroscopy was utilized by the requesting physician.  No radiographic interpretation.    EKG: Independently reviewed.  Assessment/Plan Principal Problem:   Mechanical dysphagia from esophageal cancer Active Problems:   Obesity   Hypertension   Hyperlipidemia   Anxiety   History of MI (myocardial infarction)   Type 2 diabetes mellitus with complication, without long-term current use of insulin ([facility])   Cancer of lower third of esophagus ([facility])   Protein-calorie malnutrition, moderate ([facility])   Jejunostomy tube present   Port-A-Cath in place ([facility])   Esophageal  cancer ([place])   57 year old male who presents for elective feeding tube placement due to distal esophageal cancer.  His distal esophageal obstruction has limited his ability to take his antihyperglycemic agents, there is a concern of clotting his feeding tube while inserting crushed medications.  Patient still able to take liquids by mouth, he does have nausea but no vomiting.  On his initial physical examination his temperature is 99.1, blood pressure 170/90, heart rate 72, respiratory rate12, oxygen saturation 100% with 2 L of nasal cannula.  Moist mucous membranes,  lungs clear to auscultation bilaterally, heart S1-S2 present and rhythmic, abdomen protuberant, trace lower non pitting extremity edema. 12/03 sodium 136, potassium 3.6, chloride 99, bicarb 27, glucose 264, BUN 10, creatinine 0.78.  Capillary glucose 271, 239, 281, 217, 195.  Working diagnosis uncontrolled hyperglycemia.  1.  Type 2 diabetes mellitus with uncontrolled hyperglycemia.  Patient has difficulty taking oral antihyperglycemic agents, will avoid incerting crushed medications in his feeding tube.  Patient is agreeing on insulin insulin therapy, patient has used a total of 12 units of short acting insulin over the last 24 hours.  We will proceed with 10 units of insulin glargine, and continue insulin sliding scale for glucose coverage, monitoring, and further calculation of insulin requirements.  His tube feeds apparently will be continuous infusion per pump.   2.  Hypertension.  Systolic blood pressure 144 to 170 mmHg, currently on liquid formulation of hydrochlorothiazide 25 mg daily, and metoprolol 75 mg bid, that will continue for now. Will hold on amlodipine-benazepril, note the dose of metoprolol has been increased to bid.    3.  Esophageal cancer.  Patient is status post feeding tube placement, follow-up with radiation oncology and medical oncology.  4.  Morbid obesity.  Calculated BMI 39.1, follow-up as an outpatient.  DVT  prophylaxis: scd Code Status:  full  Family Communication: no family at the bedside       Tawni Millers MD [facility] Pager [phone]  If 7PM-7AM, please contact night-coverage [email or web address] Password [email or web address]  10/18/2018, 8:50 AM

## 2018-10-18 NOTE — Progress Notes (Addendum)
Initial Nutrition Assessment  DOCUMENTATION CODES:   Non-severe (moderate) malnutrition in context of chronic illness, Obesity unspecified  INTERVENTION:   Monitor magnesium, potassium, and phosphorus daily for at least 3 days, MD to replete as needed, as pt is at risk for refeeding syndrome given malnutrition status, weight loss and poor PO intake PTA.  D/c Vital HP  Provide Jevity 1.2 @ 20 ml/hr via J-tube.  Advance every 12 hours to goal rate of 70 ml/hr vai J-tube. Will need 30 ml Prostat BID to meet protein needs.  TF regimen at goal rate: Jevity 1.2 @ 70 ml/hr via J-tube 30 ml Prostat (or appropriate alternative) BID Free water flushes of 150 ml TID (450 ml total) This provides 2216 kcal, 123g protein and 1805 ml H2O.  Continue Ensure Surgery as patient tolerates. Patient can tolerate some water PO.  NUTRITION DIAGNOSIS:   Moderate Malnutrition related to chronic illness, cancer and cancer related treatments as evidenced by percent weight loss, energy intake < or equal to 75% for > or equal to 1 month, mild fat depletion, mild muscle depletion.  GOAL:   Patient will meet greater than or equal to 90% of their needs  MONITOR:   PO intake, Supplement acceptance, Labs, Weight trends, TF tolerance, I & O's  REASON FOR ASSESSMENT:   Consult Enteral/tube feeding initiation and management  ASSESSMENT:   57 y.o. 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 He also has type 2 diabetes mellitus, dyslipidemia, hypertension, obesity and peptic ulcer disease. He was hospitalized December 3 for laparoscopic placement of feeding ileostomy tube and insertion of Port-A-Cath with ultrasound and fluoroscopy guidance  12/3: s/p LAPAROSCOPIC PLACEMENT OF  LEFT FEEDING JEJUNOSTOMY TUBE INSERTION PORT-A-CATH WITH ULTRASOUND AND FLUORO  Pt followed by [place] [facility], last seen 11/14.   Patient in room with RN at  bedside. Pt states at this time water if the main fluid he can swallow. Pt feels that he is able to swallow less and less. Tried to take in sips of Ensure Surgery, states it is too thick. Suggested some thinner liquids that may provide protein and calories. Pt states he is willing to try anything. Currently on clears.  Pt states he has limitations on what he can swallow but was shocked that he could still eat granola bars PTA.  Patient is currently receiving Vital HP @ 20 ml/hr and is tolerating via J-tube. Vital HP was ordered via the TF protocol. Will adjust orders to reflect TF he will use after discharge. Continuous infusion of feeds is recommended given patient's history of gastric bypass surgery in 2010.  Per discussion with RN, pt will be pursuing chemo and radiation. Per case management, pt with no insurance and will need to pay for TF supplies out of pocket. Discharge is expected for today.  Per patient UBW is 385 lb earlier this year. Prior to his gastric bypass surgery in 2010 he weighed ~500 lb. Per weight records, pt has lost 50 lb since 10/10 (15% wt loss x 2 months, significant for time frame).   Medications: Liquid MVI daily, Lactated Ringers infusion PRN, IV Zofran PRN Labs reviewed: CBGs: 195-217   NUTRITION - FOCUSED PHYSICAL EXAM:    Most Recent Value  Orbital Region  No depletion  Upper Arm Region  Mild depletion  Thoracic and Lumbar Region  Unable to assess  Buccal Region  No depletion  Temple Region  Mild depletion  Clavicle Bone Region  Mild depletion  Clavicle and Acromion Bone Region  No depletion  Scapular Bone Region  No depletion  Dorsal Hand  No depletion  Patellar Region  Unable to assess  Anterior Thigh Region  Unable to assess  Posterior Calf Region  Unable to assess  Edema (RD Assessment)  None       Diet Order:   Diet Order            Diet clear liquid Room service appropriate? Yes; Fluid consistency: Thin  Diet effective now               EDUCATION NEEDS:   Education needs have been addressed  Skin:  Skin Assessment: Reviewed RN Assessment  Last BM:  12/1  Height:   Ht Readings from Last 1 Encounters:  10/17/18 6' (1.829 m)    Weight:   Wt Readings from Last 1 Encounters:  10/17/18 130.9 kg    Ideal Body Weight:  80.9 kg  BMI:  Body mass index is 39.14 kg/m.  Estimated Nutritional Needs:   Kcal:  2200-2400  Protein:  120-130g  Fluid:  2.2L/day   Clayton Bibles, MS, RD, LDN [facility] Pager: [phone] After Hours Pager: [phone]

## 2018-10-18 NOTE — Discharge Summary (Addendum)
Physician Discharge Summary    Patient ID: Darrell Sutton MRN: [id number] DOB/AGE: August 25, 1961  57 y.o.  Patient Care Team: [name], [place] as PCP - General (Physician Assistant) [name], MD as PCP - Cardiology (Cardiology) Truitt Merle, MD as Consulting Physician (Medical Oncology) Doran Stabler, MD as Consulting Physician (Gastroenterology) [name], MD as Consulting Physician (Radiation Oncology)  Admit date: 10/17/2018  Discharge date: 10/19/2018  Hospital Stay = 1 days    Discharge Diagnoses:  Principal Problem:   Mechanical dysphagia from esophageal cancer Active Problems:   Cancer of lower third of esophagus ([place])   Obesity   Hypertension   Hyperlipidemia   Anxiety   History of MI (myocardial infarction)   Type 2 diabetes mellitus with complication, without long-term current use of insulin (HCC)   Protein-calorie malnutrition, moderate ([place])   Jejunostomy tube present   Port-A-Cath in place ([facility])   Esophageal cancer ([place])   1 Day Post-Op  10/17/2018  POST-OPERATIVE DIAGNOSIS:   OBSTRUCTING ESOPHAGEAL CANCER  SURGERY:  10/17/2018  Procedure(s): LAPAROSCOPIC PLACEMENT OF FEEDING JEJUNOSTOMY TUBE (18Fr MicKey) INSERTION PORT-A-CATH WITH ULTRASOUND AND FLUORO  SURGEON:  Adin Hector   Consults: Internal Medicine & pharmacy  Hospital Course:   Patient with near obstructing esophageal cancer with unintentional weight loss and failure to thrive.  Poor oral tolerance.  Underwent laparoscopically assisted feeding jejunostomy tube placement as well as Port-A-Cath placement in anticipation of chemotherapy and supplemental tube feeds.  He was started on tube feeds.  Nutrition consultation was made.  Internal medicine consultation was made for medication adjustments giving the numerous medications.  He was transitioned to Lantus and NovoLog insulin for better diabetic control and stop his oral hypoglycemics.  Liquid forms of metoprolol and proton  pump inhibitors were offered.  We will try the rest of the medicines crushed by mouth or possibly through the jejunostomy tube.  He had repeated difficulty with urination, requiring Foley catheterization.  Given stabilization of his glucoses and adequate pain control, was felt he was stable for discharge.  Case management social worker worked to help establish support for home health and starting tube feeds.  It was felt he was stable for discharge.  He will follow-up closely with medical oncology to begin palliative chemotherapy.  Postoperative recommendations were discussed.  They are written as well.  Discharged Condition: fair  Discharge Exam: Blood pressure (!) 174/97, pulse 70, temperature 98.6 F (37 C), temperature source Oral, resp. rate 14, height 6' (1.829 m), weight (!) 141.8 kg, SpO2 96 %.  General: Pt awake/alert/oriented x4 in No acute distress Eyes: PERRL, normal EOM.  Sclera clear.  No icterus Neuro: CN II-XII intact w/o focal sensory/motor deficits. Lymph: No head/neck/groin lymphadenopathy Psych:  No delerium/psychosis/paranoia HENT: Normocephalic, Mucus membranes moist.  No thrush Neck: Supple, No tracheal deviation Chest: No chest wall pain w good excursion CV:  Pulses intact.  Regular rhythm MS: Normal AROM mjr joints.  No obvious deformity Abdomen: Soft.  Nondistended.  Mild tenderness around left upper quadrant feeding jejunostomy tube only.  Jejunostomy tube site clean.  No evidence of peritonitis.  No incarcerated hernias. Ext:  SCDs BLE.  No mjr edema.  No cyanosis Skin: No petechiae / purpura   Disposition:     Discharge disposition: 01-Home or Self Care       Discharge Instructions    Call MD for:   Complete by:  As directed    FEVER > 101.5 F  (temperatures < 101.5 F are  not significant)   Call MD for:  extreme fatigue   Complete by:  As directed    Call MD for:  persistant dizziness or light-headedness   Complete by:  As directed    Call MD  for:  persistant nausea and vomiting   Complete by:  As directed    Call MD for:  redness, tenderness, or signs of infection (pain, swelling, redness, odor or green/yellow discharge around incision site)   Complete by:  As directed    Call MD for:  severe uncontrolled pain   Complete by:  As directed    Diet - low sodium heart healthy   Complete by:  As directed    Start with a bland diet such as soups, liquids, starchy foods, low fat foods, etc. the first few days at home. Gradually advance to a solid, low-fat, high fiber diet by the end of the first week at home.   Add a fiber supplement to your diet (Metamucil, etc) If you feel full, bloated, or constipated, stay on a full liquid or pureed/blenderized diet for a few days until you feel better and are no longer constipated.   Discharge instructions   Complete by:  As directed    See Discharge Instructions If you are not getting better after two weeks or are noticing you are getting worse, contact our office [phone] for further advice.  We may need to adjust your medications, re-evaluate you in the office, send you to the emergency room, or see what other things we can do to help. The clinic staff is available to answer your questions during regular business hours (8:30am-5pm).  Please don't hesitate to call and ask to speak to one of our nurses for clinical concerns.    A surgeon from [facility] is always on call at the hospitals 24 hours/day If you have a medical emergency, go to the nearest emergency room or call 911.   Discharge wound care:   Complete by:  As directed    It is good for closed incisions and even open wounds to be washed every day.  Shower every day.  Short baths are fine.  Wash the incisions and wounds clean with soap & water.     You may leave closed incisions open to air if it is dry.   You may cover the incision with clean gauze & replace it after your daily shower for comfort.  Feeding  jejunostomy tube care (see other instructions)   Driving Restrictions   Complete by:  As directed    You may drive when: - you are no longer taking narcotic prescription pain medication - you can comfortably wear a seatbelt - you can safely make sudden turns/stops without pain.   Increase activity slowly   Complete by:  As directed    Start light daily activities --- self-care, walking, climbing stairs- beginning the day after surgery.  Gradually increase activities as tolerated.  Control your pain to be active.  Stop when you are tired.  Ideally, walk several times a day, eventually an hour a day.   Most people are back to most day-to-day activities in a few weeks.  It takes 4-6 weeks to get back to unrestricted, intense activity. If you can walk 30 minutes without difficulty, it is safe to try more intense activity such as jogging, treadmill, bicycling, low-impact aerobics, swimming, etc. Save the most intensive and strenuous activity for last (Usually 4-8 weeks after surgery) such as sit-ups, heavy  lifting, contact sports, etc.  Refrain from any intense heavy lifting or straining until you are off narcotics for pain control.  You will have off days, but things should improve week-by-week. DO NOT PUSH THROUGH PAIN.  Let pain be your guide: If it hurts to do something, don't do it.   Lifting restrictions   Complete by:  As directed    If you can walk 30 minutes without difficulty, it is safe to try more intense activity such as jogging, treadmill, bicycling, low-impact aerobics, swimming, etc. Save the most intensive and strenuous activity for last (Usually 4-8 weeks after surgery) such as sit-ups, heavy lifting, contact sports, etc.   Refrain from any intense heavy lifting or straining until you are off narcotics for pain control.  You will have off days, but things should improve week-by-week. DO NOT PUSH THROUGH PAIN.  Let pain be your guide: If it hurts to do something, don't do it.  Pain is  your body warning you to avoid that activity for another week until the pain goes down.   May shower / Bathe   Complete by:  As directed    May walk up steps   Complete by:  As directed    Sexual Activity Restrictions   Complete by:  As directed    You may have sexual intercourse when it is comfortable. If it hurts to do something, stop.      Allergies as of 10/19/2018      Reactions   Penicillins Hives   Has patient had a PCN reaction causing immediate rash, facial/tongue/throat swelling, SOB or lightheadedness with hypotension: Yes Has patient had a PCN reaction causing severe rash involving mucus membranes or skin necrosis: No Has patient had a PCN reaction that required hospitalization: No; was already in hosp Has patient had a PCN reaction occurring within the last 10 years: No If all of the above answers are "NO", then may proceed with Cephalosporin use.      Medication List    STOP taking these medications   amLODipine-benazepril 10-20 MG capsule Commonly known as:  LOTREL   atorvastatin 20 MG tablet Commonly known as:  LIPITOR   empagliflozin 10 MG Tabs tablet Commonly known as:  JARDIANCE   glimepiride 2 MG tablet Commonly known as:  AMARYL   hydrochlorothiazide 25 MG tablet Commonly known as:  HYDRODIURIL Replaced by:  hydrochlorothiazide 10 mg/mL Susp   metFORMIN 1000 MG tablet Commonly known as:  GLUCOPHAGE   metoprolol tartrate 50 MG tablet Commonly known as:  LOPRESSOR Replaced by:  metoprolol tartrate 25 mg/10 mL Susp   pantoprazole 40 MG tablet Commonly known as:  PROTONIX Replaced by:  pantoprazole sodium 40 mg/20 mL Pack   VICTOZA 18 MG/3ML Sopn Generic drug:  liraglutide     TAKE these medications   albuterol (2.5 MG/3ML) 0.083% nebulizer solution Commonly known as:  PROVENTIL Take 2.5 mg by nebulization every 6 (six) hours as needed for wheezing or shortness of breath. What changed:  Another medication with the same name was removed.  Continue taking this medication, and follow the directions you see here.   aspirin EC 81 MG tablet Take 81 mg by mouth daily.   calcium carbonate 500 MG chewable tablet Commonly known as:  TUMS - dosed in mg elemental calcium Chew 2 tablets by mouth daily as needed for indigestion or heartburn.   CALCIUM CITRATE PO Take 1 tablet by mouth daily.   CENTRUM MEN Tabs Take 2 each by mouth  daily.   feeding supplement (JEVITY 1.2 CAL) Liqd Place 1,000 mLs into feeding tube daily.   feeding supplement (JEVITY 1.2 CAL) Liqd Place 1,000 mLs into feeding tube daily.   hydrochlorothiazide 10 mg/mL Susp Place 2.5 mLs (25 mg total) into feeding tube daily. Replaces:  hydrochlorothiazide 25 MG tablet   insulin aspart protamine- aspart (70-30) 100 UNIT/ML injection Commonly known as:  NOVOLOG MIX 70/30 Inject 0.07 mLs (7 Units total) into the skin 2 (two) times daily with a meal.   insulin NPH-regular Human (70-30) 100 UNIT/ML injection Inject 7 Units into the skin 2 (two) times daily with a meal. Relion please give the pen.   insulin regular 100 units/mL injection Commonly known as:  NOVOLIN R,HUMULIN R For glucose 151 to 200 use 2 units, for 201 to 250 use 5 units, for 251-300 use 7 units, for 301-350 use 9 units, for 351 to 400 use 12 units.   metoprolol tartrate 25 mg/10 mL Susp Commonly known as:  LOPRESSOR Place 30 mLs (75 mg total) into feeding tube 2 (two) times daily. Replaces:  metoprolol tartrate 50 MG tablet   oxyCODONE 5 MG/5ML solution Commonly known as:  ROXICODONE Take 5 mLs (5 mg total) by mouth every 6 (six) hours as needed for moderate pain or severe pain.   pantoprazole sodium 40 mg/20 mL Pack Commonly known as:  PROTONIX Place 20 mLs (40 mg total) into feeding tube daily. Replaces:  pantoprazole 40 MG tablet   PARoxetine 40 MG tablet Commonly known as:  PAXIL Take 40 mg by mouth every morning.   Syringe (Disposable) 3 ML Misc Insulin syringes and needles.    VITAMIN B-12 PO Take 1 tablet by mouth daily.            Durable Medical Equipment  (From admission, onward)         Start     Ordered   10/19/18 0919  For home use only DME Other see comment  Once    Comments:  Tube Feeding pump   10/19/18 0919           Discharge Care Instructions  (From admission, onward)         Start     Ordered   10/19/18 0000  Discharge wound care:    Comments:  It is good for closed incisions and even open wounds to be washed every day.  Shower every day.  Short baths are fine.  Wash the incisions and wounds clean with soap & water.     You may leave closed incisions open to air if it is dry.   You may cover the incision with clean gauze & replace it after your daily shower for comfort.   You have a feeding tuben, wash around the skin exit site with soap & water and place a new dressing of gauze or band aid around the skin every day.  Keep the drain site clean & dry.   10/19/18 1104   10/18/18 0000  Discharge wound care:    Comments:  It is good for closed incisions and even open wounds to be washed every day.  Shower every day.  Short baths are fine.  Wash the incisions and wounds clean with soap & water.     You may leave closed incisions open to air if it is dry.   You may cover the incision with clean gauze & replace it after your daily shower for comfort.  Feeding jejunostomy tube care (see other  instructions)   10/18/18 1556          Significant Diagnostic Studies:  Results for orders placed or performed during the hospital encounter of 10/17/18 (from the past 72 hour(s))  Glucose, capillary     Status: Abnormal   Collection Time: 10/17/18 11:14 AM  Result Value Ref Range   Glucose-Capillary 275 (H) 70 - 99 mg/dL  Hemoglobin A1c     Status: Abnormal   Collection Time: 10/17/18 11:59 AM  Result Value Ref Range   Hgb A1c MFr Bld 8.4 (H) 4.8 - 5.6 %    Comment: (NOTE) Pre diabetes:          5.7%-6.4% Diabetes:               >6.4% Glycemic control for   <7.0% adults with diabetes    Mean Plasma Glucose 194.38 mg/dL    Comment: Performed at [facility] [address], [place], [place] [address]  Basic metabolic panel     Status: Abnormal   Collection Time: 10/17/18 11:59 AM  Result Value Ref Range   Sodium 136 135 - 145 mmol/L   Potassium 3.6 3.5 - 5.1 mmol/L   Chloride 99 98 - 111 mmol/L   CO2 27 22 - 32 mmol/L   Glucose, Bld 264 (H) 70 - 99 mg/dL   BUN 10 6 - 20 mg/dL   Creatinine, Ser 0.78 0.61 - 1.24 mg/dL   Calcium 9.1 8.9 - 10.3 mg/dL   GFR calc non Af Amer >60 >60 mL/min   GFR calc Af Amer >60 >60 mL/min   Anion gap 10 5 - 15    Comment: Performed at [facility], [place] [address] [place], [place] [address]  CBC     Status: Abnormal   Collection Time: 10/17/18 11:59 AM  Result Value Ref Range   WBC 8.2 4.0 - 10.5 K/uL   RBC 6.04 (H) 4.22 - 5.81 MIL/uL   Hemoglobin 13.8 13.0 - 17.0 g/dL   HCT 47.9 39.0 - 52.0 %   MCV 79.3 (L) 80.0 - 100.0 fL   MCH 22.8 (L) 26.0 - 34.0 pg   MCHC 28.8 (L) 30.0 - 36.0 g/dL   RDW 14.6 11.5 - 15.5 %   Platelets 387 150 - 400 K/uL   nRBC 0.0 0.0 - 0.2 %    Comment: Performed at [facility], [place] [address] [place], [place] [address]  Glucose, capillary     Status: Abnormal   Collection Time: 10/17/18  4:01 PM  Result Value Ref Range   Glucose-Capillary 247 (H) 70 - 99 mg/dL  Glucose, capillary     Status: Abnormal   Collection Time: 10/17/18  4:56 PM  Result Value Ref Range   Glucose-Capillary 271 (H) 70 - 99 mg/dL  Glucose, capillary     Status: Abnormal   Collection Time: 10/17/18  7:47 PM  Result Value Ref Range   Glucose-Capillary 239 (H) 70 - 99 mg/dL  Glucose, capillary     Status: Abnormal   Collection Time: 10/17/18 10:52 PM  Result Value Ref Range   Glucose-Capillary 281 (H) 70 - 99 mg/dL  Glucose, capillary     Status: Abnormal   Collection Time: 10/18/18  3:23 AM  Result Value Ref Range    Glucose-Capillary 217 (H) 70 - 99 mg/dL  Glucose, capillary     Status: Abnormal   Collection Time: 10/18/18  7:29 AM  Result Value Ref Range   Glucose-Capillary 195 (  H) 70 - 99 mg/dL  Glucose, capillary     Status: Abnormal   Collection Time: 10/18/18 12:03 PM  Result Value Ref Range   Glucose-Capillary 223 (H) 70 - 99 mg/dL  Basic metabolic panel     Status: Abnormal   Collection Time: 10/18/18  1:53 PM  Result Value Ref Range   Sodium 136 135 - 145 mmol/L   Potassium 3.6 3.5 - 5.1 mmol/L   Chloride 100 98 - 111 mmol/L   CO2 26 22 - 32 mmol/L   Glucose, Bld 237 (H) 70 - 99 mg/dL   BUN 11 6 - 20 mg/dL   Creatinine, Ser 0.71 0.61 - 1.24 mg/dL   Calcium 8.8 (L) 8.9 - 10.3 mg/dL   GFR calc non Af Amer >60 >60 mL/min   GFR calc Af Amer >60 >60 mL/min   Anion gap 10 5 - 15    Comment: Performed at [facility], [place] [address], [place], [place] [address]    Dg Chest 1 View  Result Date: 10/17/2018 CLINICAL DATA:  Port-A-Cath placement. EXAM: CHEST  1 VIEW Radiation exposure index: 9.91 mGy. COMPARISON:  None. FINDINGS: Five intraoperative fluoroscopic images of the chest demonstrate placement of right-sided Port-A-Cath. Distal tip appears to be in expected position of cavoatrial junction. IMPRESSION: Fluoroscopic guidance provided during placement of right-sided Port-A-Cath. Electronically Signed   By: Marijo Conception, M.D.   On: 10/17/2018 15:51   Dg C-arm 1-60 Min-no Report  Result Date: 10/17/2018 Fluoroscopy was utilized by the requesting physician.  No radiographic interpretation.    Past Medical History:  Diagnosis Date  . Anxiety 2009   started with divorce  . Cancer [facility])    cancer of lower third of esophagus  . Diabetes mellitus without complication ([place])   . Gastric ulcer    from NSAID overuse  . GERD (gastroesophageal reflux disease)   . Hyperlipidemia   . Hypertension   . MI (myocardial infarction) ([place]) 2011   has two stents  . Obesity     highest weight in 500's    Past Surgical History:  Procedure Laterality Date  . P[place]ment SURGERY  2010  . core biopsy  10/11/2018   core biopsy of lymph nodes in Interventional Radiology  . GASTRIC BYPASS  11/24/2008  . GASTROJEJUNOSTOMY N/A 10/17/2018   Procedure: LAPAROSCOPIC PLACEMENT OF  LEFT FEEDING JEJUNOSTOMY TUBE;  Surgeon: Michael Boston, MD;  Location: [facility] ORS;  Service: General;  Laterality: N/A;  . JOINT REPLACEMENT     Bilateral knee  . PORTACATH PLACEMENT Right 10/17/2018   Procedure: INSERTION PORT-A-CATH WITH ULTRASOUND AND FLUORO;  Surgeon: Michael Boston, MD;  Location: [facility];  Service: General;  Laterality: Right;  . TONSILECTOMY/ADENOIDECTOMY WITH MYRINGOTOMY      Social History   Socioeconomic History  . Marital status: Married    Spouse name: Not on file  . Number of children: 3  . Years of education: Not on file  . Highest education level: Not on file  Occupational History  . Not on file  Social Needs  . Financial resource strain: Not on file  . Food insecurity:    Worry: Not on file    Inability: Not on file  . Transportation needs:    Medical: No    Non-medical: No  Tobacco Use  . Smoking status: Never Smoker  . Smokeless tobacco: Never Used  Substance and Sexual Activity  . Alcohol use: Never    Frequency: Never  . Drug  use: Not Currently    Types: Marijuana    Comment: Youth years- not since earlt 90's  . Sexual activity: Not on file  Lifestyle  . Physical activity:    Days per week: Not on file    Minutes per session: Not on file  . Stress: Not on file  Relationships  . Social connections:    Talks on phone: Not on file    Gets together: Not on file    Attends religious service: Not on file    Active member of club or organization: Not on file    Attends meetings of clubs or organizations: Not on file    Relationship status: Not on file  . Intimate partner violence:    Fear of current or ex partner: Not on file    Emotionally  abused: Not on file    Physically abused: Not on file    Forced sexual activity: Not on file  Other Topics Concern  . Not on file  Social History Narrative   From [place], moved here Sep 2019 with his wife and son   Son with autism, starting at [employer]       Family History  Problem Relation Age of Onset  . Hyperlipidemia Mother   . Hypertension Mother   . Hyperlipidemia Father   . Hypertension Father   . Stroke Father   . Cancer Father        NHL  . Diabetes Brother   . Heart attack Brother 27  . Hyperlipidemia Brother   . Hypertension Brother   . Heart attack Maternal Grandmother   . Heart attack Maternal Grandfather   . Early death Maternal Grandfather   . Esophageal cancer Paternal Grandmother   . Colon cancer Paternal Grandfather   . Colon cancer Cousin   . Stomach cancer Neg Hx     Current Facility-Administered Medications  Medication Dose Route Frequency Provider Last Rate Last Dose  . 0.9 %  sodium chloride infusion  250 mL Intravenous PRN Michael Boston, MD      . albuterol (PROVENTIL) (2.5 MG/3ML) 0.083% nebulizer solution 2.5 mg  2.5 mg Nebulization Q6H PRN Michael Boston, MD      . bisacodyl (DULCOLAX) suppository 10 mg  10 mg Rectal Daily PRN Michael Boston, MD      . Chlorhexidine Gluconate Cloth 2 % PADS 6 each  6 each Topical Once Michael Boston, MD      . diphenhydrAMINE (BENADRYL) 12.5 MG/5ML elixir 12.5 mg  12.5 mg Oral Q6H PRN Michael Boston, MD       Or  . diphenhydrAMINE (BENADRYL) injection 12.5 mg  12.5 mg Intravenous Q6H PRN Michael Boston, MD      . enoxaparin (LOVENOX) injection 40 mg  40 mg Subcutaneous Q24H Michael Boston, MD   40 mg at 10/18/18 0742  . feeding supplement (ENSURE SURGERY) liquid 237 mL  237 mL Oral BID BM Michael Boston, MD   237 mL at 10/17/18 1851  . feeding supplement (JEVITY 1.2 CAL) liquid 1,000 mL  1,000 mL Per Tube Q24H Michael Boston, MD 40 mL/hr at 10/18/18 1521 1,000 mL at 10/18/18 1521  . feeding supplement (PRO-STAT  SUGAR FREE 64) liquid 30 mL  30 mL Per Tube BID Michael Boston, MD   30 mL at 10/18/18 1200  . hydrALAZINE (APRESOLINE) injection 5-20 mg  5-20 mg Intravenous Q4H PRN Michael Boston, MD      . hydrochlorothiazide 10 mg/mL oral suspension 25 mg  25 mg  Per Tube Daily Angela Adam, RPH   25 mg at 10/18/18 1013  . HYDROmorphone (DILAUDID) injection 0.5-2 mg  0.5-2 mg Intravenous Q2H PRN Michael Boston, MD   1 mg at 10/18/18 1035  . insulin aspart (novoLOG) injection 0-20 Units  0-20 Units Subcutaneous Q4H Arrien, Jimmy Picket, MD      . insulin glargine (LANTUS) injection 10 Units  10 Units Subcutaneous Daily Tawni Millers, MD   10 Units at 10/18/18 1013  . insulin glargine (LANTUS) injection 5 Units  5 Units Subcutaneous Once Arrien, Jimmy Picket, MD      . lactated ringers bolus 1,000 mL  1,000 mL Intravenous TID PRN Michael Boston, MD      . lactated ringers infusion 1,000 mL  1,000 mL Intravenous Q8H PRN Michael Boston, MD   Stopped at 10/18/18 1517  . lip balm (CARMEX) ointment 1 application  1 application Topical BID Michael Boston, MD   1 application at 61/60/73 1000  . magic mouthwash  15 mL Oral QID PRN Michael Boston, MD      . methocarbamol (ROBAXIN) 1,000 mg in dextrose 5 % 50 mL IVPB  1,000 mg Intravenous Q6H PRN Michael Boston, MD   Stopped at 10/17/18 1622  . metoprolol tartrate (LOPRESSOR) 25 mg/10 mL oral suspension 75 mg  75 mg Per Tube BID Angela Adam, RPH   75 mg at 10/18/18 1014  . metoprolol tartrate (LOPRESSOR) injection 5 mg  5 mg Intravenous Q6H PRN Michael Boston, MD   5 mg at 10/18/18 7106  . multivitamin liquid 15 mL  15 mL Oral Daily Angela Adam, RPH   15 mL at 10/18/18 1014  . neomycin-bacitracin-polymyxin (NEOSPORIN) ointment 1 application  1 application Topical Daily Jesiah Grismer, Remo Lipps, MD      . ondansetron (ZOFRAN-ODT) disintegrating tablet 4 mg  4 mg Oral Q6H PRN Michael Boston, MD       Or  . ondansetron (ZOFRAN) injection 4 mg  4 mg Intravenous Q6H  PRN Michael Boston, MD   4 mg at 10/18/18 1018  . oxyCODONE (Oxy IR/ROXICODONE) immediate release tablet 5-10 mg  5-10 mg Oral Q4H PRN Michael Boston, MD      . pantoprazole sodium (PROTONIX) 40 mg/20 mL oral suspension 40 mg  40 mg Per Tube Daily Angela Adam, RPH   40 mg at 10/18/18 1014  . PARoxetine (PAXIL) tablet 40 mg  40 mg Oral Daily Michael Boston, MD   40 mg at 10/18/18 1008  . polyethylene glycol (MIRALAX / GLYCOLAX) packet 17 g  17 g Oral Daily PRN Michael Boston, MD      . prochlorperazine (COMPAZINE) tablet 10 mg  10 mg Oral Q6H PRN Michael Boston, MD       Or  . prochlorperazine (COMPAZINE) injection 5-10 mg  5-10 mg Intravenous Q6H PRN Michael Boston, MD      . simethicone (MYLICON) chewable tablet 40 mg  40 mg Oral Q6H PRN Michael Boston, MD      . sodium chloride flush (NS) 0.9 % injection 3 mL  3 mL Intravenous Gorden Harms, MD      . sodium chloride flush (NS) 0.9 % injection 3 mL  3 mL Intravenous PRN Michael Boston, MD         Allergies  Allergen Reactions  . Penicillins Hives    Has patient had a PCN reaction causing immediate rash, facial/tongue/throat swelling, SOB or lightheadedness with hypotension: Yes Has patient had a PCN reaction  causing severe rash involving mucus membranes or skin necrosis: No Has patient had a PCN reaction that required hospitalization: No; was already in hosp Has patient had a PCN reaction occurring within the last 10 years: No If all of the above answers are "NO", then may proceed with Cephalosporin use.     Signed: Morton Peters, MD, FACS, MASCRS [facility]    [address] [place], [place] [address] [phone] Main / Paging [phone] Fax   10/18/2018, 3:59 PM

## 2018-10-18 NOTE — Plan of Care (Signed)
  Problem: Clinical Measurements: Goal: Ability to maintain clinical measurements within normal limits will improve Outcome: Progressing Goal: Will remain free from infection Outcome: Progressing Goal: Diagnostic test results will improve Outcome: Progressing   Problem: Activity: Goal: Risk for activity intolerance will decrease Outcome: Progressing   

## 2018-10-18 NOTE — Progress Notes (Signed)
Per [facility], approved for 60 days of home health and 30 days for tube feedings, pump, etc.

## 2018-10-18 NOTE — Discharge Instructions (Signed)
Care of a Feeding Tube Feeding tubes are often given to those who have trouble swallowing or cannot take food or medicine. A feeding tube can:  Go into the nose and down to the stomach.  Go through the skin in the belly (abdomen) and into the stomach or small bowel.  Supplies needed to care for the tube site:  Clean gloves.  Clean wash cloth, gauze pads, or soft paper towel.  Cotton swabs.  Skin barrier ointment or cream.  Soap and water.  Precut foam pads or gauze (that go around the tube).  Tube tape. Tube site care 1. Have all supplies ready. 2. Wash hands well. 3. Put on clean gloves. 4. Remove dirty foam pads or gauze near the tube site, if present. 5. Check the skin around the tube site for redness, rash, puffiness (swelling), leaking fluid, or extra tissue growth. Call your doctor if you see any of these. 6. Wet the gauze and cotton swabs with water and soap. 7. Wipe the area closest to the tube with cotton swabs. Wipe the surrounding skin with moistened gauze. Rinse with water. 8. Dry the skin and tube site with a dry gauze pad or soft paper towel. Do not use antibiotic ointments at the tube site. 9. If the skin is red, apply petroleum jelly in a circular motion, using a cotton swab. Your doctor may suggest a different cream or ointment. Use what the doctor suggests. 10. Apply a new pre-cut foam pad or gauze around the tube. Tape the edges down. Foam pads or gauze may be left off if there is no fluid at the tube site. 11. Use tape or a device that will attach your feeding tube to your skin or do as directed. Rotate where you tape the tube. 12. Sit the person up. 13. Throw away used supplies. 14. Remove gloves. 15. Wash hands. Supplies needed to flush a feeding tube:  Clean gloves.  60 mL syringe (that connects to feeding tube).  Towel.  Water. Flushing a feeding tube 1. Have all supplies ready. 2. Wash hands well. 3. Put on clean gloves. 4. Pull 30 mL of  water into the syringe. 5. Bend (kink) the feeding tube while disconnecting it from the feeding-bag tubing or while removing the plug at the end of the tube. 6. Insert the tip of the syringe into the end of the feeding tube. Stop bending the tube. Slowly inject the water. 7. If you cannot inject the water, the person with the feeding tube should lay on their left side. ? Do not use a syringe smaller than 60 mL to flush the tubing. ? Do not inject the water with force. 8. After injecting the water, remove the syringe. 9. Always flush the tube before giving the first medicine, between medicines, and after the final medicine before starting a feeding. ? Do not mix medicines with liquid food (formula) before giving medicines. ? Do not mix medicines with other medicines before giving medicines. ? Completely flush medicines through the tube so they do not mix with the liquid food. 10. Throw away used supplies. 11. Remove gloves. 12. Wash hands. This information is not intended to replace advice given to you by your health care provider. Make sure you discuss any questions you have with your health care provider. Document Released: 07/26/2012 Document Revised: 04/08/2016 Document Reviewed: 06/15/2012 Elsevier Interactive Patient Education  2017 [place], Adult A central line is a soft, flexible tube (catheter) that  can be used to collect blood for testing or to give medicine or nutrition through a vein. The tip of the central line ends in a large vein just above the heart called the vena cava. A central line may be placed because:  You need to get medicines or fluids through an IV tube for a long period of time.  You need nutrition but cannot eat or absorb nutrients.  The veins in your hands or arms are hard to access.  You need to have blood taken often for blood tests.  You need a blood transfusion  You need chemotherapy or dialysis.  There are many types of  central lines:  Peripherally inserted central catheter (PICC) line. This type is used for intermediate access to long-term access of one week or more. It can be used to draw blood and give fluids or medicines. A PICC looks like an IV tube, but it goes up the arm to the heart. It is usually inserted in the upper arm and taped in place on the arm.  Tunneled central line. This type is used for long-term therapy and dialysis. It is placed in a large vein in the neck, chest, or groin. A tunneled central line is inserted through a small incision made over the vein and is advanced into the heart. It is tunneled beneath the skin and brought out through a second incision.  Non-tunneled central line. This type is used for short-term access, usually of a maximum of 7 days. It is often used in the emergency department. A non-tunneled central line is inserted in the neck, chest, or groin.  Implanted port. This type is used for long-term therapy. It can stay in place longer than other types of central lines. An implanted port is normally inserted in the upper chest but can also be placed in the upper arm or in the abdomen. It is inserted and removed with surgery, and it is accessed using a special needle.  The type of central line that you receive depends on how long you will need it, your medical condition, and the condition of your veins. What are the risks? Using any type of central line has risks that you should be aware of, including:  Infection.  A blood clot that blocks the central line or forms in the vein and travels to the heart.  Bleeding from the place where the central line was put in.  Developing a hole or crack within the central line. If this happens, the central line will need to be replaced.  Developing an abnormal heart rhythm (arrhythmia). This is rare.  Central line failure.  Follow these instructions at home: Flushing and cleaning the central line  Follow instructions from the  health care provider about flushing and cleaning the central line.  Wear a mask when flushing or cleaning the central line.  Before you flush or clean the central line: ? Wash your hands with soap and water. ? Clean the central line hub with rubbing alcohol. Insertion site care  Keep the insertion site of your central line clean and dry at all times.  Check your incision or central line site every day for signs of infection. Check for: ? More redness, swelling, or pain. ? More fluid or blood. ? Warmth. ? Pus or a bad smell. General instructions  Follow instructions from your health care provider for the type of device that you have.  If the central line accidentally gets pulled on, make sure: ? The bandage (  dressing) is okay. ? There is no bleeding. ? The line has not been pulled out.  Return to your normal activities as told by your health care provider. Ask your health care provider what activities are safe for you. You may be restricted from lifting or making repetitive arm movements on the side with the catheter.  Do not swim or bathe unless your health care provider approves.  Keep your dressing dry. Your health care provider can instruct you about how to keep your specific type of dressing from getting wet.  Keep all follow-up visits as told by your health care provider. This is important. Contact a health care provider if:  You have more redness, swelling, or pain around your incision.  You have more fluid or blood coming from your incision.  Your incision feels warm to the touch.  You have pus or a bad smell coming from your incision. Get help right away if:  You have: ? Chills. ? A fever. ? Shortness of breath. ? Trouble breathing. ? Chest pain. ? Swelling in your neck, face, chest, or arm on the side of your central line.  You are coughing.  You feel your heart beating rapidly or skipping beats.  You feel dizzy or you faint.  Your incision or central  line site has red streaks spreading away from the area.  Your incision or central line site is bleeding and does not stop.  Your central line is difficult to flush or will not flush.  You do not get a blood return from the central line.  Your central line gets loose or comes out.  Your central line gets damaged.  Your catheter leaks when flushed or when fluids are infused into it. This information is not intended to replace advice given to you by your health care provider. Make sure you discuss any questions you have with your health care provider. Document Released: 12/23/2005 Document Revised: 06/30/2016 Document Reviewed: 06/09/2016 Elsevier Interactive Patient Education  2017 [place].    Tube feeding regimen: Jevity 1.2 @ 20 ml/hr. Advance by 10 ml every 12 hours to goal of 70 ml/hr. Provide 30 ml of Protein modular twice daily. Free water flushes of 150 ml three times daily (this can be adjusted as tolerated).

## 2018-10-18 NOTE — Progress Notes (Signed)
Darrell Sutton   DOB:1960/11/28   AS#:[id number]   ALP#:[id number]  Oncology follow-up  Subjective: Patient is well-known to me, under my care for his newly diagnosed esophageal cancer.  He was admitted for feeding tube placement and port placement.  He tolerated procedure well yesterday.  His tube feeding has started and was on 73ml/hr when I saw him in the later afternoon today.    Objective:  Vitals:   10/18/18 1406 10/18/18 1754  BP: (!) 165/88 (!) 176/87  Pulse: 64 64  Resp: 16 14  Temp: 98.9 F (37.2 C) 98.1 F (36.7 C)  SpO2: 98% 97%    Body mass index is 39.14 kg/m.  Intake/Output Summary (Last 24 hours) at 10/18/2018 1945 Last data filed at 10/18/2018 1800 Gross per 24 hour  Intake 4305.47 ml  Output 1250 ml  Net 3055.47 ml     Sclerae unicteric  Oropharynx clear  No peripheral adenopathy  Lungs clear -- no rales or rhonchi  Heart regular rate and rhythm  Abdomen benign, (+) J-tube at left upper abdomen   MSK no focal spinal tenderness, no peripheral edema  Neuro nonfocal    CBG (last 3)  Recent Labs    10/18/18 0729 10/18/18 1203 10/18/18 1647  GLUCAP 195* 223* 176*     Labs:  Lab Results  Component Value Date   WBC 8.2 10/17/2018   HGB 13.8 10/17/2018   HCT 47.9 10/17/2018   MCV 79.3 (L) 10/17/2018   PLT 387 10/17/2018   CMP Latest Ref Rng & Units 10/18/2018 10/17/2018 08/22/2018  Glucose 70 - 99 mg/dL 237(H) 264(H) 172(H)  BUN 6 - 20 mg/dL 11 10 11   Creatinine 0.61 - 1.24 mg/dL 0.71 0.78 0.85  Sodium 135 - 145 mmol/L 136 136 136  Potassium 3.5 - 5.1 mmol/L 3.6 3.6 4.4  Chloride 98 - 111 mmol/L 100 99 98  CO2 22 - 32 mmol/L 26 27 26   Calcium 8.9 - 10.3 mg/dL 8.8(L) 9.1 9.5  Total Protein 6.5 - 8.1 g/dL - - 7.1  Total Bilirubin 0.3 - 1.2 mg/dL - - 0.6  Alkaline Phos 38 - 126 U/L - - 66  AST 15 - 41 U/L - - 15  ALT 0 - 44 U/L - - 13    Urine Studies No results for input(s): UHGB, CRYS in the last 72 hours.  Invalid input(s): UACOL, UAPR,  USPG, UPH, UTP, UGL, UKET, UBIL, UNIT, UROB, ULEU, UEPI, UWBC, URBC, UBAC, CAST, UCOM, BILUA  Basic Metabolic Panel: Recent Labs  Lab 10/17/18 1159 10/18/18 1353  NA 136 136  K 3.6 3.6  CL 99 100  CO2 27 26  GLUCOSE 264* 237*  BUN 10 11  CREATININE 0.78 0.71  CALCIUM 9.1 8.8*   GFR Estimated Creatinine Clearance: 142.5 mL/min (by C-G formula based on SCr of 0.71 mg/dL). Liver Function Tests: No results for input(s): AST, ALT, ALKPHOS, BILITOT, PROT, ALBUMIN in the last 168 hours. No results for input(s): LIPASE, AMYLASE in the last 168 hours. No results for input(s): AMMONIA in the last 168 hours. Coagulation profile No results for input(s): INR, PROTIME in the last 168 hours.  CBC: Recent Labs  Lab 10/17/18 1159  WBC 8.2  HGB 13.8  HCT 47.9  MCV 79.3*  PLT 387   Cardiac Enzymes: No results for input(s): CKTOTAL, CKMB, CKMBINDEX, TROPONINI in the last 168 hours. BNP: Invalid input(s): POCBNP CBG: Recent Labs  Lab 10/17/18 2252 10/18/18 0323 10/18/18 0729 10/18/18 1203 10/18/18 1647  GLUCAP  281* 217* 195* 223* 176*   D-Dimer No results for input(s): DDIMER in the last 72 hours. Hgb A1c Recent Labs    10/17/18 1159  HGBA1C 8.4*   Lipid Profile No results for input(s): CHOL, HDL, LDLCALC, TRIG, CHOLHDL, LDLDIRECT in the last 72 hours. Thyroid function studies No results for input(s): TSH, T4TOTAL, T3FREE, THYROIDAB in the last 72 hours.  Invalid input(s): FREET3 Anemia work up No results for input(s): VITAMINB12, FOLATE, FERRITIN, TIBC, IRON, RETICCTPCT in the last 72 hours. Microbiology No results found for this or any previous visit (from the past 240 hour(s)).    Studies:  Dg Chest 1 View  Result Date: 10/17/2018 CLINICAL DATA:  Port-A-Cath placement. EXAM: CHEST  1 VIEW Radiation exposure index: 9.91 mGy. COMPARISON:  None. FINDINGS: Five intraoperative fluoroscopic images of the chest demonstrate placement of right-sided Port-A-Cath. Distal  tip appears to be in expected position of cavoatrial junction. IMPRESSION: Fluoroscopic guidance provided during placement of right-sided Port-A-Cath. Electronically Signed   By: Marijo Conception, M.D.   On: 10/17/2018 15:51   Dg C-arm 1-60 Min-no Report  Result Date: 10/17/2018 Fluoroscopy was utilized by the requesting physician.  No radiographic interpretation.    Assessment: 57 y.o. male with recently diagnosed distal esophageal adenocarcinoma, with distant nodes metastasis, admitted for feeding tube placement.  1.  Metastatic esophageal adenocarcinoma 2.  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 Type 2 DM with uncontrolled hyperglycemia 5.  Hypertension  Plan:  -He is tolerating tube feeds well, he has not reached the goal yet, he will titrate the rate at home.  He is being discharged home later today -His medication for diabetes and hypertension has been adjusted to liquid and insulin  -I have discussed his case with rad/onc Dr. Lisbeth Renshaw and we recommend he start a short course of radiation first, followed by systemic chemo FOLFOX.  I discussed with patient and his wife, he is agreeable with the plan. -he has appointment with Dr. Lisbeth Renshaw tomorrow  -Home care has been set up for his tube feeds. We reviewed tube feeds related complications and issues in the management. -I will see him in a few weeks, towards the end of radiation    Truitt Merle, MD 10/18/2018  7:45 PM

## 2018-10-19 ENCOUNTER — Ambulatory Visit
Admit: 2018-10-19 | Discharge: 2018-10-19 | Disposition: A | Discharge status: Home / Self Care | Payer: Self-pay | Source: Ambulatory Visit | Attending: Radiation Oncology | Admitting: Radiation Oncology

## 2018-10-19 ENCOUNTER — Ambulatory Visit: Payer: Self-pay | Admitting: Radiation Oncology

## 2018-10-19 DIAGNOSIS — C155 Malignant neoplasm of lower third of esophagus: Secondary | ICD-10-CM | POA: Insufficient documentation

## 2018-10-19 DIAGNOSIS — Z510 Encounter for antineoplastic radiation therapy: Secondary | ICD-10-CM | POA: Insufficient documentation

## 2018-10-19 DIAGNOSIS — R338 Other retention of urine: Secondary | ICD-10-CM

## 2018-10-19 LAB — GLUCOSE, CAPILLARY
GLUCOSE-CAPILLARY: 176 mg/dL — AB (ref 70–99)
Glucose-Capillary: 199 mg/dL — ABNORMAL HIGH (ref 70–99)
Glucose-Capillary: 216 mg/dL — ABNORMAL HIGH (ref 70–99)

## 2018-10-19 MED ORDER — OXYCODONE HCL 5 MG/5ML PO SOLN: 5.0000 mg | Freq: Four times a day (QID) | ORAL | 0 refills | Status: DC | PRN

## 2018-10-19 MED ORDER — JEVITY 1.2 CAL PO LIQD: 1000.0000 mL | ORAL | 6 refills | Status: DC

## 2018-10-19 MED ORDER — LACTULOSE 10 GM/15ML PO SOLN
20.0000 g | Freq: Two times a day (BID) | ORAL | Status: DC
  Administered 2018-10-19: 20 g
  Filled 2018-10-19: qty 30

## 2018-10-19 MED ORDER — INSULIN ASPART PROT & ASPART (70-30 MIX) 100 UNIT/ML ~~LOC~~ SUSP: 7.0000 [IU] | Freq: Two times a day (BID) | SUBCUTANEOUS | 11 refills | Status: DC

## 2018-10-19 MED ORDER — JEVITY 1.2 CAL PO LIQD
1000.0000 mL | ORAL | Status: DC
  Filled 2018-10-19: qty 1000

## 2018-10-19 NOTE — Progress Notes (Signed)
Darrell Sutton [id number] 1961/06/24  CARE TEAM:  PCP: [name], PA  Outpatient Care Team: Patient Care Team: [name], [place] as PCP - General (Physician Assistant) [name], MD as PCP - Cardiology (Cardiology) Truitt Merle, MD as Consulting Physician (Medical Oncology) [name], Kirke Corin, MD as Consulting Physician (Gastroenterology) [name], MD as Consulting Physician (Radiation Oncology)  Inpatient Treatment Team: Treatment Team: Attending Provider: Michael Boston, MD; Registered Nurse: Nelida Meuse, RN; Consulting Physician: Truitt Merle, MD; Consulting Physician: Tawni Millers, MD; Rounding Team: Joycelyn Das, MD; Student Nurse: Tillman Sers, Student-RN   Problem List:   Principal Problem:   Mechanical dysphagia from esophageal cancer Active Problems:   Cancer of lower third of esophagus ([place])   Obesity   Hypertension   Hyperlipidemia   Anxiety   History of MI (myocardial infarction)   Type 2 diabetes mellitus with complication, without long-term current use of insulin ([place])   Protein-calorie malnutrition, moderate ([place])   Jejunostomy tube present   Port-A-Cath in place ([facility])   Acute urinary retention   2 Days Post-Op  10/17/2018  Procedure(s): LAPAROSCOPIC PLACEMENT OF  LEFT FEEDING JEJUNOSTOMY TUBE INSERTION PORT-A-CATH WITH ULTRASOUND AND FLUORO    Assessment  OK  [facility] Stay = 1 days)  Plan:  Jejunostomy tube care.  Tube feeds.  Advance rate every 12 hours to goal of 70 an hour.  Urinary retention requiring Foley catheterization.  This will need to stay 72 hours.  Patient come back in our office on Monday for nurse to remove.  Hopefully will be working at that point.  DM poorly controlled.  HgbA1C 8.4.  Consulted internal medicine.  [facility] hospitalists.  Also pharmacy.  Switch to subcutaneous long and short-term insulin regimen.  Other medications switched to liquid form for jejunostomy tube  administration  Most likely discharge later today if can have new medicine regimen and home health is arranged for tube feeds.  [name] aware of surgery.  Plan palliative radiation followed up with chemoTx soon  VTE prophylaxis- SCDs, etc  Mobilize as tolerated to help recovery  35 minutes spent in review, evaluation, examination, counseling, and coordination of care.  More than 50% of that time was spent in counseling.  10/19/2018    Subjective: (Chief complaint)  Persistent inability to urinate.  Catheter placed on second in and out catheterization.  Working towards trying to get home health tube feeds set up.  Discharge delayed.  Patient tired and depressed but consolable.  Pain and nausea under control.  Taking small sips.  Objective:  Vital signs:  Vitals:   10/18/18 1406 10/18/18 1754 10/18/18 2027 10/19/18 0357  BP: (!) 165/88 (!) 176/87 (!) 188/87 (!) 179/94  Pulse: 64 64 63 66  Resp: 16 14 16 14   Temp: 98.9 F (37.2 C) 98.1 F (36.7 C) 98.1 F (36.7 C) 97.8 F (36.6 C)  TempSrc: Oral Oral Oral Oral  SpO2: 98% 97% 100% 98%  Weight:      Height:        Last BM Date: 10/15/18  Intake/Output   Yesterday:  12/04 0701 - 12/05 0700 In: 2093 [P.O.:240; I.V.:1240; NG/GT:583] Out: 1575 [Urine:1575] This shift:  No intake/output data recorded.  Bowel function:  Flatus: YES  BM:  No  Drain: (No drain)   Physical Exam:  General: Pt awake/alert/oriented x4 in no acute distress Eyes: PERRL, normal EOM.  Sclera clear.  No icterus Neuro: CN II-XII intact w/o focal sensory/motor deficits. Lymph: No head/neck/groin  lymphadenopathy Psych:  No delerium/psychosis/paranoia.  Depressed HENT: Normocephalic, Mucus membranes moist.  No thrush Neck: Supple, No tracheal deviation Chest: No chest wall pain w good excursion.  R infraclavicular port site clean. CV:  Pulses intact.  Regular rhythm MS: Normal AROM mjr joints.  No obvious deformity  Abdomen: Soft.   Nondistended.  Tenderness at LUQ J tube site only.  Site clean.  No evidence of peritonitis.  No incarcerated hernias.  GU: Foley in place with clean yellow urine Ext:  No deformity.  No mjr edema.  No cyanosis Skin: No petechiae / purpura  Results:   Labs: Results for orders placed or performed during the hospital encounter of 10/17/18 (from the past 48 hour(s))  Glucose, capillary     Status: Abnormal   Collection Time: 10/17/18 11:14 AM  Result Value Ref Range   Glucose-Capillary 275 (H) 70 - 99 mg/dL  Hemoglobin A1c     Status: Abnormal   Collection Time: 10/17/18 11:59 AM  Result Value Ref Range   Hgb A1c MFr Bld 8.4 (H) 4.8 - 5.6 %    Comment: (NOTE) Pre diabetes:          5.7%-6.4% Diabetes:              >6.4% Glycemic control for   <7.0% adults with diabetes    Mean Plasma Glucose 194.38 mg/dL    Comment: Performed at [facility] [address] [place], [place] [address]  Basic metabolic panel     Status: Abnormal   Collection Time: 10/17/18 11:59 AM  Result Value Ref Range   Sodium 136 135 - 145 mmol/L   Potassium 3.6 3.5 - 5.1 mmol/L   Chloride 99 98 - 111 mmol/L   CO2 27 22 - 32 mmol/L   Glucose, Bld 264 (H) 70 - 99 mg/dL   BUN 10 6 - 20 mg/dL   Creatinine, Ser 0.78 0.61 - 1.24 mg/dL   Calcium 9.1 8.9 - 10.3 mg/dL   GFR calc non Af Amer >60 >60 mL/min   GFR calc Af Amer >60 >60 mL/min   Anion gap 10 5 - 15    Comment: Performed at [facility], [place] [address] [place], [place] [address]  CBC     Status: Abnormal   Collection Time: 10/17/18 11:59 AM  Result Value Ref Range   WBC 8.2 4.0 - 10.5 K/uL   RBC 6.04 (H) 4.22 - 5.81 MIL/uL   Hemoglobin 13.8 13.0 - 17.0 g/dL   HCT 47.9 39.0 - 52.0 %   MCV 79.3 (L) 80.0 - 100.0 fL   MCH 22.8 (L) 26.0 - 34.0 pg   MCHC 28.8 (L) 30.0 - 36.0 g/dL   RDW 14.6 11.5 - 15.5 %   Platelets 387 150 - 400 K/uL   nRBC 0.0 0.0 - 0.2 %    Comment: Performed at [facility], [place]  [address], [place], [place] [address]  Glucose, capillary     Status: Abnormal   Collection Time: 10/17/18  4:01 PM  Result Value Ref Range   Glucose-Capillary 247 (H) 70 - 99 mg/dL  Glucose, capillary     Status: Abnormal   Collection Time: 10/17/18  4:56 PM  Result Value Ref Range   Glucose-Capillary 271 (H) 70 - 99 mg/dL  Glucose, capillary     Status: Abnormal   Collection Time: 10/17/18  7:47 PM  Result Value Ref Range   Glucose-Capillary 239 (H) 70 - 99 mg/dL  Glucose, capillary     Status: Abnormal   Collection Time: 10/17/18 10:52 PM  Result Value Ref Range   Glucose-Capillary 281 (H) 70 - 99 mg/dL  Glucose, capillary     Status: Abnormal   Collection Time: 10/18/18  3:23 AM  Result Value Ref Range   Glucose-Capillary 217 (H) 70 - 99 mg/dL  Glucose, capillary     Status: Abnormal   Collection Time: 10/18/18  7:29 AM  Result Value Ref Range   Glucose-Capillary 195 (H) 70 - 99 mg/dL  Glucose, capillary     Status: Abnormal   Collection Time: 10/18/18 12:03 PM  Result Value Ref Range   Glucose-Capillary 223 (H) 70 - 99 mg/dL  Basic metabolic panel     Status: Abnormal   Collection Time: 10/18/18  1:53 PM  Result Value Ref Range   Sodium 136 135 - 145 mmol/L   Potassium 3.6 3.5 - 5.1 mmol/L   Chloride 100 98 - 111 mmol/L   CO2 26 22 - 32 mmol/L   Glucose, Bld 237 (H) 70 - 99 mg/dL   BUN 11 6 - 20 mg/dL   Creatinine, Ser 0.71 0.61 - 1.24 mg/dL   Calcium 8.8 (L) 8.9 - 10.3 mg/dL   GFR calc non Af Amer >60 >60 mL/min   GFR calc Af Amer >60 >60 mL/min   Anion gap 10 5 - 15    Comment: Performed at [facility], [place] [address] [place], [place] [address]  Glucose, capillary     Status: Abnormal   Collection Time: 10/18/18  4:47 PM  Result Value Ref Range   Glucose-Capillary 176 (H) 70 - 99 mg/dL  Glucose, capillary     Status: Abnormal   Collection Time: 10/18/18  8:30 PM  Result Value Ref Range   Glucose-Capillary 139 (H) 70 - 99 mg/dL  Glucose,  capillary     Status: Abnormal   Collection Time: 10/18/18 11:52 PM  Result Value Ref Range   Glucose-Capillary 196 (H) 70 - 99 mg/dL  Glucose, capillary     Status: Abnormal   Collection Time: 10/19/18  4:00 AM  Result Value Ref Range   Glucose-Capillary 199 (H) 70 - 99 mg/dL    Imaging / Studies: Dg Chest 1 View  Result Date: 10/17/2018 CLINICAL DATA:  Port-A-Cath placement. EXAM: CHEST  1 VIEW Radiation exposure index: 9.91 mGy. COMPARISON:  None. FINDINGS: Five intraoperative fluoroscopic images of the chest demonstrate placement of right-sided Port-A-Cath. Distal tip appears to be in expected position of cavoatrial junction. IMPRESSION: Fluoroscopic guidance provided during placement of right-sided Port-A-Cath. Electronically Signed   By: Marijo Conception, M.D.   On: 10/17/2018 15:51   Dg C-arm 1-60 Min-no Report  Result Date: 10/17/2018 Fluoroscopy was utilized by the requesting physician.  No radiographic interpretation.    Medications / Allergies: per chart  Antibiotics: Anti-infectives (From admission, onward)   Start     Dose/Rate Route Frequency Ordered Stop   10/17/18 1300  gentamicin (GARAMYCIN) 490 mg in dextrose 5 % 100 mL IVPB     5 mg/kg  98.9 kg (Adjusted) 224.5 mL/hr over 30 Minutes Intravenous On call to O.R. 10/17/18 1127 10/17/18 1626   10/17/18 1229  clindamycin (CLEOCIN) 900 MG/50ML IVPB    Note to Pharmacy:  Waldron Session   : cabinet override      10/17/18 1229 10/17/18 1339   10/17/18 1130  clindamycin (CLEOCIN) IVPB 900 mg     900 mg 100 mL/hr over  30 Minutes Intravenous On call to O.R. 10/17/18 1127 10/17/18 1409        Note: Portions of this report may have been transcribed using voice recognition software. Every effort was made to ensure accuracy; however, inadvertent computerized transcription errors may be present.   Any transcriptional errors that result from this process are unintentional.     Adin Hector, MD, FACS,  [facility]    [address] [place], [place] [address] [phone] [address] / Paging [phone] Fax

## 2018-10-19 NOTE — Progress Notes (Signed)
Met with pt and wife today.  Per d/c orders, plan is to d/c patient home today with continuous tube feeds.  Amaryl, Jardiance, Metformin, and Victoza to stop and patient to start on 70/30 Insulin 7 units BID + Regular Insulin per SSI TID.  Discussed w/ wife and pt the 2 different insulins that pt will be sent home.  Explained how they work, when to take.  Wife of pt uses insulin pens at home and has also used vial and syringe in the past and is very comfortable giving her husband (the pt) his insulin injections.  Wife was able to verbalize proper draw up and admin of both vial and syringe and insulin pens.  Has CBG meter at home and can get additional supplies if needed.  Discussed with wife that [employer] sells CBGs meters and supplies for low cost OTC.  Pt and wife to get 70/30 Insulin and Regular insulin at [facility] as well. 70/30 pens should be about $43 for 5 pens and 1 vial of Regular should be $25.  Can get syringes and insulin pen needles at [facility] as well.  Explained Symptoms of Hypoglycemia and educated on how to treat: Silly, Sweaty, Shaky Check sugar if you have your meter.  If near or less than 70 mg/dl, treat with 1/2 cup juice  Check sugar 15 minutes after treatment.  If sugar still near or less than 70 mg/al and symptomatic, treat again and may need a snack with some protein (peanut butter with crackers, etc)  Wife plans to use pt's feeding tube for low blood sugar treatment if necessary.  Reminded wife to flush the tube thoroughly with water after juice given.    --Will follow patient during hospitalization--  Wyn Quaker RN, MSN, CDE Diabetes Coordinator Inpatient Glycemic Control Team Team Pager: [phone] (8a-5p)

## 2018-10-19 NOTE — Progress Notes (Signed)
Patient ID: Darrell Sutton, male   DOB: 03/21/1961, 57 y.o.   MRN: [id number] Hospitalist was consulted for hyperglycemia and uncontrolled type 2 diabetes mellitus.  He is currently on long-acting insulin along with sliding scale coverage and is being planned for discharge on 70/30 insulin.  Blood sugars are better controlled.  he will probably be discharged later today by the primary team.  He is medically stable for discharge.  Outpatient follow-up with PCP regarding his diabetes mellitus and other medical problems.

## 2018-10-20 ENCOUNTER — Telehealth: Payer: Self-pay | Admitting: Hematology

## 2018-10-20 ENCOUNTER — Telehealth: Payer: Self-pay | Admitting: *Deleted

## 2018-10-20 NOTE — Telephone Encounter (Signed)
Left voicemail requesting call back.  

## 2018-10-20 NOTE — Telephone Encounter (Signed)
Scheduled appt per 12/6 sch message - patient is aware of appt date and time   

## 2018-10-23 ENCOUNTER — Ambulatory Visit
Admission: RE | Admit: 2018-10-23 | Discharge: 2018-10-23 | Disposition: A | Discharge status: Home / Self Care | Payer: Self-pay | Source: Ambulatory Visit | Attending: Radiation Oncology | Admitting: Radiation Oncology

## 2018-10-23 NOTE — Telephone Encounter (Signed)
Spoke with patient. He states he is following up with his oncologist. States that his insulin was changed in the hospital. He is unsure if he needs to come in to see PCP. States he starts radiation this afternoon. States he has been checking his glucose and his blood pressure have been running good. Sam, please advise if you prefer patient come into office for hospital follow up appt.

## 2018-10-23 NOTE — Telephone Encounter (Signed)
Called and spoke with patient. He states he will call back to schedule due to him having to schedule more appts this afternoon. He states he is doing well and does not need anything currently. Transition Care Management Follow-up Telephone Call   Date discharged? 10/18/18   How have you been since you were released from the hospital? "good"    Do you understand why you were in the hospital? yes   Do you understand the discharge instructions? yes   Where were you discharged to? Home   Items Reviewed:  Medications reviewed: yes  Allergies reviewed: yes  Dietary changes reviewed: yes  Referrals reviewed: yes   Functional Questionnaire:   Activities of Daily Living (ADLs):   He states they are independent in the following: ambulation, bathing and hygiene, feeding, continence, grooming, toileting, dressing and Wife helping with ADLs States they require assistance with the following: Wife helping with ADLs   Any transportation issues/concerns?: yes   Any patient concerns? no   Confirmed importance and date/time of follow-up visits scheduled yes  Patient will return call to schedule appt.   Confirmed with patient if condition begins to worsen call PCP or go to the ER.  Patient was given the office number and encouraged to call back with question or concerns.  : yes

## 2018-10-23 NOTE — Telephone Encounter (Signed)
I would like for him to follow-up sometime this week so I can see how things are going overall with his new diagnosis.  Aldona Bar

## 2018-10-24 ENCOUNTER — Telehealth: Payer: Self-pay | Admitting: Physician Assistant

## 2018-10-24 ENCOUNTER — Ambulatory Visit
Admission: RE | Admit: 2018-10-24 | Discharge: 2018-10-24 | Disposition: A | Discharge status: Home / Self Care | Payer: Self-pay | Source: Ambulatory Visit | Attending: Radiation Oncology | Admitting: Radiation Oncology

## 2018-10-24 DIAGNOSIS — C155 Malignant neoplasm of lower third of esophagus: Secondary | ICD-10-CM

## 2018-10-24 MED ORDER — SONAFINE EX EMUL
1.0000 "application " | Freq: Two times a day (BID) | CUTANEOUS | Status: DC
  Administered 2018-10-24: 1 via TOPICAL

## 2018-10-24 NOTE — Telephone Encounter (Signed)
Caller states pt has HTN, just had feeding tube placed. Was receiving liquid HCTZ 25 mg (10mg /ml suspension) per tube q am and metoprolol tartrate 30 mL (25 mg/10 mL) per tube bid, ordered on DC. Zofran was also supposed to be ordered, not on DC orders. No orders sent to pharmacy- [facility] [address] [phone]).   PER [facility]

## 2018-10-24 NOTE — Progress Notes (Signed)
  Oncology Nurse Navigator Documentation  Navigator Location: [facility] (10/24/18 1201)   )Navigator Encounter Type: Other(Rad/Onc) (10/24/18 1201)  Met with patient after radiation treatment this AM to offer support. No navigation needs voiced.   Confirmed Diagnosis Date: 10/13/18 (10/24/18 1201)             Treatment Initiated Date: 10/23/18 (10/24/18 1201)   Treatment Phase: First Radiation Tx(10/23/18) (10/24/18 1201) Barriers/Navigation Needs: No barriers at this time (10/24/18 1201)   Interventions: Psycho-social support (10/24/18 1201)            Acuity: Level 2 (10/24/18 1201)         Time Spent with Patient: 15 (10/24/18 1201)

## 2018-10-25 ENCOUNTER — Other Ambulatory Visit: Payer: Self-pay | Admitting: Hematology

## 2018-10-25 ENCOUNTER — Telehealth: Payer: Self-pay | Admitting: Physician Assistant

## 2018-10-25 ENCOUNTER — Ambulatory Visit
Admission: RE | Admit: 2018-10-25 | Discharge: 2018-10-25 | Disposition: A | Discharge status: Home / Self Care | Payer: Self-pay | Source: Ambulatory Visit | Attending: Radiation Oncology | Admitting: Radiation Oncology

## 2018-10-25 ENCOUNTER — Other Ambulatory Visit: Payer: Self-pay | Admitting: Physician Assistant

## 2018-10-25 MED ORDER — ONDANSETRON HCL 4 MG/5ML PO SOLN: 4.0000 mg | Freq: Three times a day (TID) | ORAL | 1 refills | Status: DC | PRN

## 2018-10-25 NOTE — Telephone Encounter (Signed)
Copied from [place] [id number]. Topic: Quick Communication - Home Health Verbal Orders >> Oct 25, 2018  2:39 PM Margot Ables wrote: Caller/Agency: Suzzanne Cloud w/[facility] Callback Number: [phone], secure VM if no answer Requesting OT/PT/Skilled Nursing/Social Work: SW eval Frequency: SW eval x 1 visit

## 2018-10-25 NOTE — Telephone Encounter (Signed)
Sent!

## 2018-10-25 NOTE — Telephone Encounter (Signed)
Please see message. Pt needing Zofran in liquid form for feeding tube.

## 2018-10-25 NOTE — Progress Notes (Signed)
  Radiation Oncology         [phone] ________________________________  Name: SANAD FEARNOW MRN: [id number]  Date: 10/19/2018  DOB: 07/21/1961  SIMULATION AND TREATMENT PLANNING NOTE  DIAGNOSIS:     ICD-10-CM   1. Cancer of lower third of esophagus ([place]) C15.5      Site:  Lower esophagus  NARRATIVE:  The patient was brought to the [facility].  Identity was confirmed.  All relevant records and images related to the planned course of therapy were reviewed.   Written consent to proceed with treatment was confirmed which was freely given after reviewing the details related to the planned course of therapy had been reviewed with the patient.  Then, the patient was set-up in a stable reproducible  supine position for radiation therapy.  CT images were obtained.  Surface markings were placed.    Medically necessary complex treatment device(s) for immobilization:  Vac-lock bag.   The CT images were loaded into the planning software.  Then the target and avoidance structures were contoured.  Treatment planning then occurred.  The radiation prescription was entered and confirmed.   I have requested : Intensity Modulated Radiotherapy (IMRT) is medically necessary for this case for the following reason:  Sparing of adjacent critical normal structures including the cord and heart.   The patient will undergo daily image guidance to ensure accurate localization of the target, and adequate minimize dose to the normal surrounding structures in close proximity to the target.  PLAN:  The patient will receive 39 Gy in 13 fractions.  ________________________________   Jodelle Gross, MD, PhD

## 2018-10-25 NOTE — Telephone Encounter (Signed)
See note

## 2018-10-25 NOTE — Telephone Encounter (Signed)
Spoke to pt told him Rx for Zofran was sent to pharmacy. Pt verbalized understanding.

## 2018-10-26 ENCOUNTER — Ambulatory Visit
Admission: RE | Admit: 2018-10-26 | Discharge: 2018-10-26 | Disposition: A | Discharge status: Home / Self Care | Payer: Self-pay | Source: Ambulatory Visit | Attending: Radiation Oncology | Admitting: Radiation Oncology

## 2018-10-26 ENCOUNTER — Telehealth: Payer: Self-pay | Admitting: Physician Assistant

## 2018-10-26 ENCOUNTER — Telehealth: Payer: Self-pay

## 2018-10-26 NOTE — Telephone Encounter (Signed)
Left detailed message on personal voicemail that I received call from [facility] regarding your blood pressure and unable to swallow and can't get medications and J tube leaking. Aldona Bar is not in the office this afternoon. Please go to the ER to be evaluated as soon as you get this message.

## 2018-10-26 NOTE — Telephone Encounter (Signed)
Faxed signed orders back to [facility], sent to HIM for scanning to chart.  

## 2018-10-26 NOTE — Telephone Encounter (Signed)
Called patient and spoke with him. He states that he spoke with Dr Johney Maine today and he told him that there is nothing much they can do about his J tube. He states he was told to try to take his oral metoprolol. States he has not tried today but he will. States he is going to see Dr Lisbeth Renshaw, radiation oncologist, tomorrow. I explained that his BP is very high and that increases his chance of stroke. I explained we still suggest for him to go to the ED as the hospital can give him blood pressure medication through his IV. He stated understanding but did not seem inclined to go. I also asked if he had found a good time to come in to see [name], [place]. He states he hasn't as he has several appointments coming up. I advised him to schedule an appointment as soon as he could.

## 2018-10-26 NOTE — Telephone Encounter (Signed)
Late entry: Called [facility] and left message on voicemail that I left message on pt's voicemail to go to the ER for evaluation.

## 2018-10-26 NOTE — Telephone Encounter (Signed)
Copied from [place] [id number]. Topic: Quick Communication - Home Health Verbal Orders >> Oct 26, 2018 12:14 PM Alanda Slim E wrote: Caller/Agency: [facility] Number: [phone] (secure line- VM can be left)   SN- Pt is unable to swallow any medication since Monday 12.09.2019.  Pharmacy advised there is no liquid forms for meds. There is also a $425 copay for pantoprazole sodium (PROTONIX) 40 mg/20 mL PACK (Pt can not afford it)  BP is elavated @ 184/98 at noon today and wife stated it was 192/103 this morning. Pt has lost 5LBS in three days  J tube has been leaking for 2 days

## 2018-10-27 ENCOUNTER — Ambulatory Visit
Admission: RE | Admit: 2018-10-27 | Discharge: 2018-10-27 | Disposition: A | Discharge status: Home / Self Care | Payer: Self-pay | Source: Ambulatory Visit | Attending: Radiation Oncology | Admitting: Radiation Oncology

## 2018-10-27 NOTE — Telephone Encounter (Signed)
Please call and see how patient is doing and how BP is running.  Darrell Sutton

## 2018-10-27 NOTE — Telephone Encounter (Signed)
Left detailed message on Darrell Sutton's voicemail, okay per Aldona Bar to order OT/PT/Skilled Nursing/Social Work. Social work evaluation x 1 visit. Any other orders needed or questions call office.

## 2018-10-27 NOTE — Telephone Encounter (Signed)
Left voicemail requesting call back.  

## 2018-10-27 NOTE — Telephone Encounter (Signed)
Samantha aware. °

## 2018-10-30 ENCOUNTER — Ambulatory Visit
Admission: RE | Admit: 2018-10-30 | Discharge: 2018-10-30 | Disposition: A | Discharge status: Home / Self Care | Payer: Self-pay | Source: Ambulatory Visit | Attending: Radiation Oncology | Admitting: Radiation Oncology

## 2018-10-30 ENCOUNTER — Encounter: Payer: Self-pay | Admitting: Hematology

## 2018-10-31 ENCOUNTER — Ambulatory Visit
Admission: RE | Admit: 2018-10-31 | Discharge: 2018-10-31 | Disposition: A | Discharge status: Home / Self Care | Payer: Self-pay | Source: Ambulatory Visit | Attending: Radiation Oncology | Admitting: Radiation Oncology

## 2018-10-31 ENCOUNTER — Inpatient Hospital Stay: Payer: Self-pay | Admitting: Nutrition

## 2018-10-31 ENCOUNTER — Other Ambulatory Visit: Payer: Self-pay | Admitting: Medical

## 2018-10-31 ENCOUNTER — Inpatient Hospital Stay (HOSPITAL_BASED_OUTPATIENT_CLINIC_OR_DEPARTMENT_OTHER): Payer: Self-pay | Admitting: Medical

## 2018-10-31 ENCOUNTER — Inpatient Hospital Stay: Payer: Self-pay

## 2018-10-31 VITALS — BP 157/92 | HR 88 | Resp 18 | Ht 72.0 in | Wt 282.3 lb

## 2018-10-31 DIAGNOSIS — E1165 Type 2 diabetes mellitus with hyperglycemia: Secondary | ICD-10-CM

## 2018-10-31 DIAGNOSIS — R110 Nausea: Secondary | ICD-10-CM

## 2018-10-31 DIAGNOSIS — E118 Type 2 diabetes mellitus with unspecified complications: Secondary | ICD-10-CM

## 2018-10-31 DIAGNOSIS — C155 Malignant neoplasm of lower third of esophagus: Secondary | ICD-10-CM

## 2018-10-31 DIAGNOSIS — G4700 Insomnia, unspecified: Secondary | ICD-10-CM

## 2018-10-31 DIAGNOSIS — Z95828 Presence of other vascular implants and grafts: Secondary | ICD-10-CM

## 2018-10-31 DIAGNOSIS — Z794 Long term (current) use of insulin: Secondary | ICD-10-CM

## 2018-10-31 DIAGNOSIS — R112 Nausea with vomiting, unspecified: Secondary | ICD-10-CM

## 2018-10-31 DIAGNOSIS — I10 Essential (primary) hypertension: Secondary | ICD-10-CM

## 2018-10-31 LAB — CMP (CANCER CENTER ONLY)
ALT: 240 U/L — ABNORMAL HIGH (ref 0–44)
AST: 84 U/L — ABNORMAL HIGH (ref 15–41)
Albumin: 3.1 g/dL — ABNORMAL LOW (ref 3.5–5.0)
Alkaline Phosphatase: 350 U/L — ABNORMAL HIGH (ref 38–126)
Anion gap: 10 (ref 5–15)
BUN: 18 mg/dL (ref 6–20)
CO2: 32 mmol/L (ref 22–32)
Calcium: 9.5 mg/dL (ref 8.9–10.3)
Chloride: 95 mmol/L — ABNORMAL LOW (ref 98–111)
Creatinine: 0.85 mg/dL (ref 0.61–1.24)
GFR, Est AFR Am: >60 mL/min (ref >60)
GFR, Estimated: >60 mL/min (ref >60)
GLUCOSE: 474 mg/dL — AB (ref 70–99)
Potassium: 4 mmol/L (ref 3.5–5.1)
SODIUM: 137 mmol/L (ref 135–145)
Total Bilirubin: 1.4 mg/dL — ABNORMAL HIGH (ref 0.3–1.2)
Total Protein: 7.5 g/dL (ref 6.5–8.1)

## 2018-10-31 LAB — CBC WITH DIFFERENTIAL (CANCER CENTER ONLY)
Abs Immature Granulocytes: 0.05 10*3/uL (ref 0.00–0.07)
Basophils Absolute: 0 10*3/uL (ref 0.0–0.1)
Basophils Relative: 0 %
Eosinophils Absolute: 0.1 10*3/uL (ref 0.0–0.5)
Eosinophils Relative: 1 %
HEMATOCRIT: 47.3 % (ref 39.0–52.0)
Hemoglobin: 13.9 g/dL (ref 13.0–17.0)
Immature Granulocytes: 1 %
LYMPHS ABS: 0.3 10*3/uL — AB (ref 0.7–4.0)
Lymphocytes Relative: 3 %
MCH: 22.9 pg — ABNORMAL LOW (ref 26.0–34.0)
MCHC: 29.4 g/dL — ABNORMAL LOW (ref 30.0–36.0)
MCV: 78.1 fL — ABNORMAL LOW (ref 80.0–100.0)
Monocytes Absolute: 0.7 10*3/uL (ref 0.1–1.0)
Monocytes Relative: 7 %
Neutro Abs: 9.4 10*3/uL — ABNORMAL HIGH (ref 1.7–7.7)
Neutrophils Relative %: 88 %
Platelet Count: 357 10*3/uL (ref 150–400)
RBC: 6.06 MIL/uL — ABNORMAL HIGH (ref 4.22–5.81)
RDW: 16 % — ABNORMAL HIGH (ref 11.5–15.5)
WBC Count: 10.6 10*3/uL — ABNORMAL HIGH (ref 4.0–10.5)
nRBC: 0 % (ref 0.0–0.2)

## 2018-10-31 MED ORDER — CLONIDINE 0.1 MG/24HR TD PTWK: 0.1000 mg | MEDICATED_PATCH | TRANSDERMAL | 12 refills | Status: AC

## 2018-10-31 MED ORDER — METOPROLOL TARTRATE 25 MG/10 ML ORAL SUSPENSION: 75.0000 mg | Freq: Two times a day (BID) | ORAL | 0 refills | Status: DC

## 2018-10-31 MED ORDER — INSULIN REGULAR HUMAN 100 UNIT/ML IJ SOLN: 5.0000 [IU] | Freq: Once | INTRAMUSCULAR | Status: DC

## 2018-10-31 MED ORDER — INSULIN REGULAR HUMAN 100 UNIT/ML IJ SOLN: INTRAMUSCULAR | 0 refills | Status: DC

## 2018-10-31 MED ORDER — LORAZEPAM 0.5 MG PO TABS: 0.5000 mg | ORAL_TABLET | Freq: Four times a day (QID) | ORAL | 2 refills | Status: AC | PRN

## 2018-10-31 MED ORDER — OXYCODONE HCL 5 MG/5ML PO SOLN: 5.0000 mg | Freq: Four times a day (QID) | ORAL | 0 refills | Status: DC | PRN

## 2018-10-31 MED ORDER — HEPARIN SOD (PORK) LOCK FLUSH 100 UNIT/ML IV SOLN
500.0000 [IU] | Freq: Once | INTRAVENOUS | Status: AC
  Administered 2018-10-31: 500 [IU] via INTRAVENOUS
  Filled 2018-10-31: qty 5

## 2018-10-31 MED ORDER — SODIUM CHLORIDE 0.9% FLUSH
10.0000 mL | INTRAVENOUS | Status: DC | PRN
  Administered 2018-10-31: 10 mL via INTRAVENOUS
  Filled 2018-10-31: qty 10

## 2018-10-31 MED ORDER — INSULIN ASPART PROT & ASPART (70-30 MIX) 100 UNIT/ML ~~LOC~~ SUSP: 7.0000 [IU] | Freq: Two times a day (BID) | SUBCUTANEOUS | 11 refills | Status: DC

## 2018-10-31 MED ORDER — INSULIN REGULAR HUMAN 100 UNIT/ML IJ SOLN
5.0000 [IU] | Freq: Once | INTRAMUSCULAR | Status: AC
  Administered 2018-10-31: 5 [IU] via SUBCUTANEOUS
  Filled 2018-10-31: qty 10

## 2018-10-31 MED ORDER — SCOPOLAMINE 1 MG/3DAYS TD PT72: 1.0000 | MEDICATED_PATCH | TRANSDERMAL | 5 refills | Status: AC

## 2018-10-31 MED ORDER — SODIUM CHLORIDE 0.9 % IV SOLN
INTRAVENOUS | Status: DC
  Administered 2018-10-31: 10:00:00 via INTRAVENOUS
  Filled 2018-10-31: qty 250

## 2018-10-31 MED ORDER — HYDROCHLOROTHIAZIDE 10 MG/ML ORAL SUSPENSION: 25.0000 mg | Freq: Every day | ORAL | 0 refills | Status: DC

## 2018-10-31 MED ORDER — PAROXETINE HCL 10 MG/5ML PO SUSP: 40.0000 mg | ORAL | 5 refills | Status: DC

## 2018-10-31 MED FILL — SCOPOLAMINE 1 MG/3DAYS PT72: 1 | 30 days supply | Qty: 10 | Fill #0

## 2018-10-31 MED FILL — cloNIDine 0.1 MG/24HR PTWK: 0.1 | 28 days supply | Qty: 4 | Fill #0

## 2018-10-31 MED FILL — LORazepam 0.5 MG TABS: 0.5 | 10 days supply | Qty: 40 | Fill #0

## 2018-10-31 NOTE — Patient Instructions (Signed)
Dehydration, Adult Dehydration is a condition in which there is not enough fluid or water in the body. This happens when you lose more fluids than you take in. Important organs, such as the kidneys, brain, and heart, cannot function without a proper amount of fluids. Any loss of fluids from the body can lead to dehydration. Dehydration can range from mild to severe. This condition should be treated right away to prevent it from becoming severe. What are the causes? This condition may be caused by:  Vomiting.  Diarrhea.  Excessive sweating, such as from heat exposure or exercise.  Not drinking enough fluid, especially: ? When ill. ? While doing activity that requires a lot of energy.  Excessive urination.  Fever.  Infection.  Certain medicines, such as medicines that cause the body to lose excess fluid (diuretics).  Inability to access safe drinking water.  Reduced physical ability to get adequate water and food.  What increases the risk? This condition is more likely to develop in people:  Who have a poorly controlled long-term (chronic) illness, such as diabetes, heart disease, or kidney disease.  Who are age 65 or older.  Who are disabled.  Who live in a place with high altitude.  Who play endurance sports.  What are the signs or symptoms? Symptoms of mild dehydration may include:  Thirst.  Dry lips.  Slightly dry mouth.  Dry, warm skin.  Dizziness. Symptoms of moderate dehydration may include:  Very dry mouth.  Muscle cramps.  Dark urine. Urine may be the color of tea.  Decreased urine production.  Decreased tear production.  Heartbeat that is irregular or faster than normal (palpitations).  Headache.  Light-headedness, especially when you stand up from a sitting position.  Fainting (syncope). Symptoms of severe dehydration may include:  Changes in skin, such as: ? Cold and clammy skin. ? Blotchy (mottled) or pale skin. ? Skin that does  not quickly return to normal after being lightly pinched and released (poor skin turgor).  Changes in body fluids, such as: ? Extreme thirst. ? No tear production. ? Inability to sweat when body temperature is high, such as in hot weather. ? Very little urine production.  Changes in vital signs, such as: ? Weak pulse. ? Pulse that is more than 100 beats a minute when sitting still. ? Rapid breathing. ? Low blood pressure.  Other changes, such as: ? Sunken eyes. ? Cold hands and feet. ? Confusion. ? Lack of energy (lethargy). ? Difficulty waking up from sleep. ? Short-term weight loss. ? Unconsciousness. How is this diagnosed? This condition is diagnosed based on your symptoms and a physical exam. Blood and urine tests may be done to help confirm the diagnosis. How is this treated? Treatment for this condition depends on the severity. Mild or moderate dehydration can often be treated at home. Treatment should be started right away. Do not wait until dehydration becomes severe. Severe dehydration is an emergency and it needs to be treated in a hospital. Treatment for mild dehydration may include:  Drinking more fluids.  Replacing salts and minerals in your blood (electrolytes) that you may have lost. Treatment for moderate dehydration may include:  Drinking an oral rehydration solution (ORS). This is a drink that helps you replace fluids and electrolytes (rehydrate). It can be found at pharmacies and retail stores. Treatment for severe dehydration may include:  Receiving fluids through an IV tube.  Receiving an electrolyte solution through a feeding tube that is passed through your nose   and into your stomach (nasogastric tube, or NG tube).  Correcting any abnormalities in electrolytes.  Treating the underlying cause of dehydration. Follow these instructions at home:  If directed by your health care provider, drink an ORS: ? Make an ORS by following instructions on the  package. ? Start by drinking small amounts, about  cup (120 mL) every 5-10 minutes. ? Slowly increase how much you drink until you have taken the amount recommended by your health care provider.  Drink enough clear fluid to keep your urine clear or pale yellow. If you were told to drink an ORS, finish the ORS first, then start slowly drinking other clear fluids. Drink fluids such as: ? Water. Do not drink only water. Doing that can lead to having too little salt (sodium) in the body (hyponatremia). ? Ice chips. ? Fruit juice that you have added water to (diluted fruit juice). ? Low-calorie sports drinks.  Avoid: ? Alcohol. ? Drinks that contain a lot of sugar. These include high-calorie sports drinks, fruit juice that is not diluted, and soda. ? Caffeine. ? Foods that are greasy or contain a lot of fat or sugar.  Take over-the-counter and prescription medicines only as told by your health care provider.  Do not take sodium tablets. This can lead to having too much sodium in the body (hypernatremia).  Eat foods that contain a healthy balance of electrolytes, such as bananas, oranges, potatoes, tomatoes, and spinach.  Keep all follow-up visits as told by your health care provider. This is important. Contact a health care provider if:  You have abdominal pain that: ? Gets worse. ? Stays in one area (localizes).  You have a rash.  You have a stiff neck.  You are more irritable than usual.  You are sleepier or more difficult to wake up than usual.  You feel weak or dizzy.  You feel very thirsty.  You have urinated only a small amount of very dark urine over 6-8 hours. Get help right away if:  You have symptoms of severe dehydration.  You cannot drink fluids without vomiting.  Your symptoms get worse with treatment.  You have a fever.  You have a severe headache.  You have vomiting or diarrhea that: ? Gets worse. ? Does not go away.  You have blood or green matter  (bile) in your vomit.  You have blood in your stool. This may cause stool to look black and tarry.  You have not urinated in 6-8 hours.  You faint.  Your heart rate while sitting still is over 100 beats a minute.  You have trouble breathing. This information is not intended to replace advice given to you by your health care provider. Make sure you discuss any questions you have with your health care provider. Document Released: 11/01/2005 Document Revised: 05/28/2016 Document Reviewed: 12/26/2015 Elsevier Interactive Patient Education  2018 [employer]  

## 2018-10-31 NOTE — Progress Notes (Signed)
Nutrition follow-up completed with patient and his wife.  He is receiving radiation therapy for esophageal cancer. Patient weighed 282.3 pounds on December 17.  Last weight documented was 312 pounds on December 4.  This is a 10% weight loss in 2 weeks which is significant. Patient states that he does not feel well today.  He is weak.  Reports blood pressure has been very high and blood sugars have also been between 200 to greater than 300. He reports at least one episode of gagging/dry heaving daily. Reports he feels constipated however he is having a bowel movement every 2-3 days. Reports good tolerance of Jevity 1.2 at 70 mL an hour via jejunostomy tube. He does not tolerate Protostat via jejunostomy tube.  He reports he tastes it and he cannot tolerate it so he has discontinued this.  Estimated nutrition needs: 2600-2800 cal, 130-160 g protein, 2.6 L fluid.  Nutrition diagnosis: Severe malnutrition continues.  Intervention: Begin Glucerna 1.5 via jejunostomy tube at 70 mL an hour over 24 hours to provide approximately 2492 cal, 137 g protein, 1260 mL free water.  Provided a complementary case for trial. Discontinue Protostat for now. Refer patient to symptom management for evaluation. Lab work is pending. Questions were answered.  Teach back method used.  Monitoring, evaluation, goals: Patient will tolerate Glucerna 1.5 at 70 mL an hour via jejunostomy tube to provide greater than 90% estimated nutrition needs.  Next visit: We will follow-up tomorrow for tube feeding tolerance and possible change of tube feeding formula.  **Disclaimer: This note was dictated with voice recognition software. Similar sounding words can inadvertently be transcribed and this note may contain transcription errors which may not have been corrected upon publication of note.**

## 2018-11-01 ENCOUNTER — Ambulatory Visit
Admission: RE | Admit: 2018-11-01 | Discharge: 2018-11-01 | Disposition: A | Discharge status: Home / Self Care | Payer: Self-pay | Source: Ambulatory Visit | Attending: Radiation Oncology | Admitting: Radiation Oncology

## 2018-11-02 ENCOUNTER — Ambulatory Visit
Admission: RE | Admit: 2018-11-02 | Discharge: 2018-11-02 | Disposition: A | Discharge status: Home / Self Care | Payer: Self-pay | Source: Ambulatory Visit | Attending: Radiation Oncology | Admitting: Radiation Oncology

## 2018-11-02 NOTE — Progress Notes (Signed)
[facility] Progress Note   Darrell Sutton [id number] 11-Nov-1961 57 y.o.  Darrell Sutton is managed by Dr. Truitt Merle  Actively treated with chemotherapy/immunotherapy/hormonal therapy: No  Current Therapy: none  Last Treated: n/a  Assessment: Plan:    Cancer of lower third of esophagus ([place]) - Plan: 0.9 %  sodium chloride infusion  Type 2 diabetes mellitus with complication, without long-term current use of insulin ([facility]) - Plan: insulin regular (NOVOLIN R,HUMULIN R) 100 units/mL injection 5 Units, DISCONTINUED: insulin regular (NOVOLIN R,HUMULIN R) 100 units/mL injection 5 Units  Port-A-Cath in place - Plan: heparin lock flush 100 unit/mL, sodium chloride flush (NS) 0.9 % injection 10 mL   1) Metastatic esophageal cancer: Patient has completed 7 fractions of radiation to his esophagus thus far.  It is hoped that radiation will help to shrink his tumor so that he can swallow.  2) Nausea:  The patient was given 1 liter NS IV and was begun on scopolamine patch.  3) Hypertension: The patient has not been able to find liquid lopressor liquid HCTZ so far.  His BP was 143/103 today.  He was given as prescription for Clonidine 0.1 mg patches today.,  He will wear a patch for 7 days before changing.  4) Insomnia: The patient was given a prescription for Ativan 0.5 mg SL every night at bedtime as needed for insomnia.  5) Diabetes w/hyperglycemia: The patient is currently controlling his diabetes with insulin.  His glucose returned at 474 today.  Hr was dosed with 5 units of regular insulin today.  Please see After Visit Summary for patient specific instructions.  Future Appointments  Date Time Provider [place]  11/03/2018  8:40 AM [facility] LINAC 4 [facility] None  11/06/2018  8:40 AM [facility] LINAC 4 [facility] None  11/06/2018  1:45 PM Ernestene Kiel L, RD [facility] None  11/07/2018  8:40 AM [facility] LINAC 4 [facility] None  11/07/2018  9:30  AM [facility] LAB 4 [facility] None  11/07/2018  9:45 AM [facility] [place] FLUSH [facility] None  11/07/2018 10:15 AM Truitt Merle, MD [facility] None  11/09/2018  8:40 AM [facility] LINAC 4 [facility] None    No orders of the defined types were placed in this encounter.      Subjective:   Patient ID:  Darrell Sutton is a 57 y.o. (DOB December 07, 1960) male.  Chief Complaint: No chief complaint on file.   HPI Darrell Sutton is a 57 y.o. male with a recently diagnosed metastatic esophageal cancer who is managed by [name].  His tumor completely blocks his esophagus.  He is s/p 7 fractions of radiation thus far.  Hr was a G-tube for nutrition.  He was seen by a Nutritionist Ernestene Kiel today and given a case of Glucerna.  He is managing his diabetes with insulin and continues to have hyperglycemia.  He was given a prescription for liquid lopressor and HCTZ but has not been able to obtain these from his pharmacy.  He has nausea and is also troubled by insomnia.  Medications: I have reviewed the patient's current medications.  Allergies:  Allergies  Allergen Reactions  . Penicillins Hives    Has patient had a PCN reaction causing immediate rash, facial/tongue/throat swelling, SOB or lightheadedness with hypotension: Yes Has patient had a PCN reaction causing severe rash involving mucus membranes or skin necrosis: No Has patient had a PCN reaction that required hospitalization: No; was already in hosp Has patient had a PCN reaction occurring within  the last 10 years: No If all of the above answers are "NO", then may proceed with Cephalosporin use.     Past Medical History:  Diagnosis Date  . Anxiety 2009   started with divorce  . Cancer [facility])    cancer of lower third of esophagus  . Diabetes mellitus without complication ([place])   . Gastric ulcer    from NSAID overuse  . GERD (gastroesophageal reflux disease)   . Hyperlipidemia   . Hypertension   . MI (myocardial infarction) ([place])  2011   has two stents  . Obesity    highest weight in 500's    Past Surgical History:  Procedure Laterality Date  . [place] SURGERY  2010  . core biopsy  10/11/2018   core biopsy of lymph nodes in Interventional Radiology  . GASTRIC BYPASS  11/24/2008  . GASTROJEJUNOSTOMY N/A 10/17/2018   Procedure: LAPAROSCOPIC PLACEMENT OF  LEFT FEEDING JEJUNOSTOMY TUBE;  Surgeon: Michael Boston, MD;  Location: [facility] ORS;  Service: General;  Laterality: N/A;  . JOINT REPLACEMENT     Bilateral knee  . PORTACATH PLACEMENT Right 10/17/2018   Procedure: INSERTION PORT-A-CATH WITH ULTRASOUND AND FLUORO;  Surgeon: Michael Boston, MD;  Location: [facility]S;  Service: General;  Laterality: Right;  . TONSILECTOMY/ADENOIDECTOMY WITH MYRINGOTOMY      Family History  Problem Relation Age of Onset  . Hyperlipidemia Mother   . Hypertension Mother   . Hyperlipidemia Father   . Hypertension Father   . Stroke Father   . Cancer Father        NHL  . Diabetes Brother   . Heart attack Brother 10  . Hyperlipidemia Brother   . Hypertension Brother   . Heart attack Maternal Grandmother   . Heart attack Maternal Grandfather   . Early death Maternal Grandfather   . Esophageal cancer Paternal Grandmother   . Colon cancer Paternal Grandfather   . Colon cancer Cousin   . Stomach cancer Neg Hx     Social History   Socioeconomic History  . Marital status: Married    Spouse name: Not on file  . Number of children: 3  . Years of education: Not on file  . Highest education level: Not on file  Occupational History  . Not on file  Social Needs  . Financial resource strain: Not on file  . Food insecurity:    Worry: Not on file    Inability: Not on file  . Transportation needs:    Medical: No    Non-medical: No  Tobacco Use  . Smoking status: Never Smoker  . Smokeless tobacco: Never Used  Substance and Sexual Activity  . Alcohol use: Never    Frequency: Never  . Drug use: Not Currently    Types: Marijuana     Comment: Youth years- not since earlt 90's  . Sexual activity: Not on file  Lifestyle  . Physical activity:    Days per week: Not on file    Minutes per session: Not on file  . Stress: Not on file  Relationships  . Social connections:    Talks on phone: Not on file    Gets together: Not on file    Attends religious service: Not on file    Active member of club or organization: Not on file    Attends meetings of clubs or organizations: Not on file    Relationship status: Not on file  . Intimate partner violence:    Fear of current or  ex partner: Not on file    Emotionally abused: Not on file    Physically abused: Not on file    Forced sexual activity: Not on file  Other Topics Concern  . Not on file  Social History Narrative   From [place], moved here Sep 2019 with his wife and son   Son with autism, starting at [employer]       Past Medical History, Surgical history, Social history, and Family history were reviewed and updated as appropriate.   Please see review of systems for further details on the patient's review from today.   Review of Systems:  Review of Systems  Constitutional: Negative for activity change, appetite change, chills, diaphoresis and fever.  HENT: Positive for trouble swallowing. Negative for sore throat.   Respiratory: Negative for cough, chest tightness and shortness of breath.   Cardiovascular: Negative for chest pain, palpitations and leg swelling.  Gastrointestinal: Positive for nausea. Negative for abdominal distention, abdominal pain, constipation, diarrhea and vomiting.  Psychiatric/Behavioral: Positive for sleep disturbance. The patient is nervous/anxious.     Objective:   Physical Exam:  BP (!) 157/92 (BP Location: Left Arm, Patient Position: Sitting)   Pulse 88   Resp 18   Ht 6' (1.829 m)   Wt 282 lb 4.8 oz (128.1 kg)   SpO2 98%   BMI 38.29 kg/m  ECOG: 1  Physical Exam Constitutional:      General: He is not in acute  distress.    Appearance: He is not diaphoretic.  HENT:     Head: Normocephalic and atraumatic.  Cardiovascular:     Rate and Rhythm: Normal rate and regular rhythm.     Heart sounds: Normal heart sounds. No murmur. No friction rub. No gallop.   Pulmonary:     Effort: Pulmonary effort is normal. No respiratory distress.     Breath sounds: Normal breath sounds. No wheezing or rales.  Abdominal:     General: Bowel sounds are normal. There is no distension.     Comments: 2 well-healed surgical incisions were noted in the right lateral abdomen with a PEG tube noted in the left upper quadrant.  Skin:    General: Skin is warm and dry.     Findings: No erythema or rash.  Neurological:     Mental Status: He is alert.     Lab Review:     Component Value Date/Time   NA 137 10/31/2018 1025   K 4.0 10/31/2018 1025   CL 95 (L) 10/31/2018 1025   CO2 32 10/31/2018 1025   GLUCOSE 474 (H) 10/31/2018 1025   BUN 18 10/31/2018 1025   CREATININE 0.85 10/31/2018 1025   CALCIUM 9.5 10/31/2018 1025   PROT 7.5 10/31/2018 1025   ALBUMIN 3.1 (L) 10/31/2018 1025   AST 84 (H) 10/31/2018 1025   ALT 240 (H) 10/31/2018 1025   ALKPHOS 350 (H) 10/31/2018 1025   BILITOT 1.4 (H) 10/31/2018 1025   GFRNONAA >60 10/31/2018 1025   GFRAA >60 10/31/2018 1025       Component Value Date/Time   WBC 10.6 (H) 10/31/2018 1025   WBC 8.2 10/17/2018 1159   RBC 6.06 (H) 10/31/2018 1025   HGB 13.9 10/31/2018 1025   HCT 47.3 10/31/2018 1025   PLT 357 10/31/2018 1025   MCV 78.1 (L) 10/31/2018 1025   MCH 22.9 (L) 10/31/2018 1025   MCHC 29.4 (L) 10/31/2018 1025   RDW 16.0 (H) 10/31/2018 1025   LYMPHSABS 0.3 (L) 10/31/2018 1025  MONOABS 0.7 10/31/2018 1025   EOSABS 0.1 10/31/2018 1025   BASOSABS 0.0 10/31/2018 1025   -------------------------------  Imaging from last 24 hours (if applicable):  Radiology interpretation: Dg Chest 1 View  Result Date: 10/17/2018 CLINICAL DATA:  Port-A-Cath placement. EXAM:  CHEST  1 VIEW Radiation exposure index: 9.91 mGy. COMPARISON:  None. FINDINGS: Five intraoperative fluoroscopic images of the chest demonstrate placement of right-sided Port-A-Cath. Distal tip appears to be in expected position of cavoatrial junction. IMPRESSION: Fluoroscopic guidance provided during placement of right-sided Port-A-Cath. Electronically Signed   By: Marijo Conception, M.D.   On: 10/17/2018 15:51   Dg C-arm 1-60 Min-no Report  Result Date: 10/17/2018 Fluoroscopy was utilized by the requesting physician.  No radiographic interpretation.   [place] Core Biopsy (lymph Nodes)  Result Date: 10/11/2018 INDICATION: 57 year old with esophageal cancer and suspicious lymph nodes. Plan for ultrasound-guided biopsy of left supraclavicular lymph nodes. EXAM: ULTRASOUND-GUIDED BIOPSY OF LEFT SUPRACLAVICULAR LYMPH NODE MEDICATIONS: None. ANESTHESIA/SEDATION: Moderate (conscious) sedation was employed during this procedure. A total of Versed 2.0 mg and Fentanyl 75 mcg was administered intravenously. Moderate Sedation Time: 21 minutes. The patient's level of consciousness and vital signs were monitored continuously by radiology nursing throughout the procedure under my direct supervision. FLUOROSCOPY TIME:  None COMPLICATIONS: None immediate. PROCEDURE: Informed written consent was obtained from the patient after a thorough discussion of the procedural risks, benefits and alternatives. All questions were addressed. A timeout was performed prior to the initiation of the procedure. Left side of the neck was evaluated with ultrasound. Abnormal lymph node/nodes in the left supraclavicular region was targeted for biopsy. The skin was prepped with chlorhexidine and sterile field was created. Skin and soft tissues anesthetized with 1% lidocaine. Using ultrasound guidance, 2 core biopsies were performed with a BioPince core device but no significant tissue was yielded. Therefore, a Bard [place] biopsy device was used. No  significant tissue was obtained on the first Bard core pass. Additional core biopsies were obtained with the Bard device and 4 adequate core biopsies were obtained. Bandage placed over the puncture site. FINDINGS: Bilobed abnormal lymph node or adjacent lymph nodes in the left supraclavicular region. This bilobed lymphadenopathy was successfully biopsied. Needle position was confirmed within this lesion. IMPRESSION: Ultrasound-guided left supraclavicular lymph node biopsy. Electronically Signed   By: Markus Daft M.D.   On: 10/11/2018 17:45        This case was discussed with [name]. She expressed agreement with my management of this patient.

## 2018-11-03 ENCOUNTER — Ambulatory Visit
Admission: RE | Admit: 2018-11-03 | Discharge: 2018-11-03 | Disposition: A | Discharge status: Home / Self Care | Payer: Self-pay | Source: Ambulatory Visit | Attending: Radiation Oncology | Admitting: Radiation Oncology

## 2018-11-05 ENCOUNTER — Other Ambulatory Visit: Payer: Self-pay | Admitting: Hematology

## 2018-11-05 DIAGNOSIS — C155 Malignant neoplasm of lower third of esophagus: Secondary | ICD-10-CM

## 2018-11-06 ENCOUNTER — Inpatient Hospital Stay: Payer: Self-pay | Admitting: Nutrition

## 2018-11-06 ENCOUNTER — Ambulatory Visit
Admission: RE | Admit: 2018-11-06 | Discharge: 2018-11-06 | Disposition: A | Discharge status: Home / Self Care | Payer: Self-pay | Source: Ambulatory Visit | Attending: Radiation Oncology | Admitting: Radiation Oncology

## 2018-11-06 ENCOUNTER — Encounter: Payer: Self-pay | Admitting: Nutrition

## 2018-11-06 NOTE — Progress Notes (Signed)
[name]   [phone] [phone]   Clinic Follow up Note   Patient Care Team: [name], [place] as PCP - General (Physician Assistant) [name], MD as PCP - Cardiology (Cardiology) Truitt Merle, MD as Consulting Physician (Medical Oncology) [name] Kirke Corin, MD as Consulting Physician (Gastroenterology) [name], MD as Consulting Physician (Radiation Oncology) 11/07/2018  CHIEF COMPLAINT: F/u on esophageal cancer   SUMMARY OF ONCOLOGIC HISTORY: Oncology History   Cancer Staging Cancer of lower third of esophagus [facility]) Staging form: Esophagus - Adenocarcinoma, AJCC 8th Edition - Clinical stage from 10/11/2018: Stage IVB (cTX, cN2, pM1) - Signed by Truitt Merle, MD on 10/17/2018       Cancer of lower third of esophagus ([place])   09/20/2018 Procedure    Upper Endoscopy by Dr. [name] 09/20/18  IMPRESSION - Normal larynx. - Partially obstructing, likely malignant esophageal tumor was found in the lower third of the esophagus. Biopsied. - Gastric bypass with a normal-sized pouch and gastrojejunal anastomosis characterized by ulceration. - Normal examined jejunum.    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 Focal hypermetabolism in the distal esophagus is consistent with the patient's known malignancy. 2. Areas of ill-defined upper normal to mildly enlarged lymph nodes identified in the left supraclavicular region, left thoracic inlet, upper abdomen, and para-aortic retroperitoneal space. These areas also show stranding around the lymph nodes. Low level FDG accumulation in these lymph nodes is borderline for  infectious versus neoplastic etiology. While metastatic disease is certainly a consideration, infectious/inflammatory etiology is also possible and lymphoma cannot be excluded.     10/11/2018 Imaging    10/11/2018 [place] Core Biopsy FINDINGS: Bilobed abnormal lymph node or adjacent lymph nodes in the left supraclavicular region. This bilobed lymphadenopathy was successfully biopsied. Needle position was confirmed within this lesion.  IMPRESSION: Ultrasound-guided left supraclavicular lymph node biopsy.    10/11/2018 Cancer Staging    Staging form: Esophagus - Adenocarcinoma, AJCC 8th Edition - Clinical stage from 10/11/2018: Stage IVB (cTX, cN2, pM1) - Signed by Truitt Merle, MD on 10/17/2018    10/11/2018 Pathology Results    Left supraclavicular lymph node biopsy showed metastatic adenocarcinoma.     11/07/2018 -  Chemotherapy     (HERCEPTIN)    11/13/2018 -  Chemotherapy    FOLFOX     CURRENT THERAPY Radiation. Pending chemo    INTERVAL HISTORY: [name] is a 57 y.o. male who is here for follow-up. He is currently receiving IMRT treatment with Dr. Lisbeth Renshaw. He also has a feeding jejunostomy tube insertion by Dr. Johney Maine on 10/17/2018. Today, he is here with his wife. He doesn't see that radiation is shrinking the tumor and he also experiences burning and pain with radiation. His pain is 3-4/10 and keeps him awake at night. He wakes up at night to rinse and spit, and sometimes wakes up gagging. His mouth is dryer with scopolamine. His energy level is low and he is dehydrated. He uses his feeding tube and flushes constantly. He eats 70 ML/Hr. He is able to take a shower and take care of himself. He tries to stay active  and move around the house. His sister is a physical therapist. He has a BM every other day, and he related that to low food intake.     Pertinent positives and negatives of review of systems are listed and detailed within the above HPI.  REVIEW OF SYSTEMS:     Constitutional: Denies fevers, chills or abnormal weight loss (+) low energy (+) dehydrated  Eyes: Denies blurriness of vision Ears, nose, mouth, throat, and face: Denies mucositis or sore throat Respiratory: Denies cough, dyspnea or wheezes Cardiovascular: Denies palpitation, chest discomfort or lower extremity swelling Gastrointestinal:  Denies nausea, heartburn or change in bowel habits (+) feeding tube (+) pain and burning with radiation  Skin: Denies abnormal skin rashes Lymphatics: Denies new lymphadenopathy or easy bruising Neurological:Denies numbness, tingling or new weaknesses Behavioral/Psych: Mood is stable, no new changes  All other systems were reviewed with the patient and are negative.  MEDICAL HISTORY:  Past Medical History:  Diagnosis Date  . Anxiety 2009   started with divorce  . Cancer [facility])    cancer of lower third of esophagus  . Diabetes mellitus without complication ([place])   . Gastric ulcer    from NSAID overuse  . GERD (gastroesophageal reflux disease)   . Hyperlipidemia   . Hypertension   . MI (myocardial infarction) ([place]) 2011   has two stents  . Obesity    highest weight in 500's    SURGICAL HISTORY: Past Surgical History:  Procedure Laterality Date  . [place] SURGERY  2010  . core biopsy  10/11/2018   core biopsy of lymph nodes in Interventional Radiology  . GASTRIC BYPASS  11/24/2008  . GASTROJEJUNOSTOMY N/A 10/17/2018   Procedure: LAPAROSCOPIC PLACEMENT OF  LEFT FEEDING JEJUNOSTOMY TUBE;  Surgeon: Michael Boston, MD;  Location: [facility];  Service: General;  Laterality: N/A;  . JOINT REPLACEMENT     Bilateral knee  . PORTACATH PLACEMENT Right 10/17/2018   Procedure: INSERTION PORT-A-CATH WITH ULTRASOUND AND FLUORO;  Surgeon: Michael Boston, MD;  Location: [facility];  Service: General;  Laterality: Right;  . TONSILECTOMY/ADENOIDECTOMY WITH MYRINGOTOMY      I have reviewed the social history and family history with the patient and they are  unchanged from previous note.  ALLERGIES:  is allergic to penicillins.  MEDICATIONS:  Current Outpatient Medications  Medication Sig Dispense Refill  . cloNIDine (CATAPRES - DOSED IN MG/24 HR) 0.1 mg/24hr patch Place 1 patch (0.1 mg total) onto the skin once a week. 4 patch 12  . insulin aspart protamine- aspart (NOVOLOG MIX 70/30) (70-30) 100 UNIT/ML injection Inject 0.07 mLs (7 Units total) into the skin 2 (two) times daily with a meal. 10 mL 11  . insulin NPH-regular Human (NOVOLIN 70/30) (70-30) 100 UNIT/ML injection Inject 7 Units into the skin 2 (two) times daily with a meal. Relion please give the pen. 4.2 mL 0  . insulin regular (NOVOLIN R,HUMULIN R) 100 units/mL injection For glucose 151 to 200 use 2 units, for 201 to 250 use 5 units, for 251-300 use 7 units, for 301-350 use 9 units, for 351 to 400 use 12 units. 10 mL 0  . LORazepam (ATIVAN) 0.5 MG tablet Place 1 tablet (0.5 mg total) under the tongue every 6 (six) hours as needed for anxiety. 40 tablet 2  . ondansetron (ZOFRAN) 4 MG/5ML solution Place 5 mLs (4 mg total) into feeding tube every 8 (eight) hours as needed for nausea or vomiting. 50 mL 1  . scopolamine (TRANSDERM-SCOP) 1 MG/3DAYS  Place 1 patch (1.5 mg total) onto the skin every 3 (three) days. 10 patch 5  . albuterol (PROVENTIL) (2.5 MG/3ML) 0.083% nebulizer solution Take 2.5 mg by nebulization every 6 (six) hours as needed for wheezing or shortness of breath.    Marland Kitchen aspirin EC 81 MG tablet Take 81 mg by mouth daily.    . calcium carbonate (TUMS - DOSED IN MG ELEMENTAL CALCIUM) 500 MG chewable tablet Chew 2 tablets by mouth daily as needed for indigestion or heartburn.    Marland Kitchen CALCIUM CITRATE PO Take 1 tablet by mouth daily.    . Cyanocobalamin (VITAMIN B-12 PO) Take 1 tablet by mouth daily.    . hydrochlorothiazide 10 mg/mL SUSP Place 2.5 mLs (25 mg total) into feeding tube daily. (Patient not taking: Reported on 11/07/2018) 75 mL 0  . metoprolol tartrate (LOPRESSOR) 25 mg/10  mL SUSP Place 30 mLs (75 mg total) into feeding tube 2 (two) times daily. (Patient not taking: Reported on 11/07/2018) 1800 mL 0  . Multiple Vitamins-Minerals (CENTRUM MEN) TABS Take 2 each by mouth daily.     . Nutritional Supplements (FEEDING SUPPLEMENT, JEVITY 1.2 CAL,) LIQD Place 1,000 mLs into feeding tube daily. (Patient not taking: Reported on 11/07/2018) 30000 mL 2  . Nutritional Supplements (FEEDING SUPPLEMENT, JEVITY 1.2 CAL,) LIQD Place 1,000 mLs into feeding tube daily. (Patient not taking: Reported on 11/07/2018) 30000 mL 6  . oxyCODONE (ROXICODONE) 5 MG/5ML solution Take 5 mLs (5 mg total) by mouth every 6 (six) hours as needed for moderate pain or severe pain. 200 mL 0  . pantoprazole sodium (PROTONIX) 40 mg/20 mL PACK Place 20 mLs (40 mg total) into feeding tube daily. (Patient not taking: Reported on 11/07/2018) 30 each 2  . paroxetine (PAXIL) 10 MG/5ML suspension Take 20 mLs (40 mg total) by mouth every morning. (Patient not taking: Reported on 11/07/2018) 750 mL 5  . Syringe, Disposable, 3 ML MISC Insulin syringes and needles. (Patient not taking: Reported on 11/07/2018) 100 each 0   No current facility-administered medications for this visit.    Facility-Administered Medications Ordered in Other Visits  Medication Dose Route Frequency Provider Last Rate Last Dose  . sodium chloride flush (NS) 0.9 % injection 10 mL  10 mL Intravenous PRN Truitt Merle, MD   10 mL at 11/07/18 1334    PHYSICAL EXAMINATION: ECOG PERFORMANCE STATUS: 3 - Symptomatic, >50% confined to bed  Vitals:   11/07/18 1013  BP: (!) 168/96  Pulse: (!) 101  Resp: 16  Temp: 98.1 F (36.7 C)  SpO2: 97%   Filed Weights   11/07/18 1013  Weight: 283 lb 4.8 oz (128.5 kg)    GENERAL:alert, no distress and comfortable (+) hold a bottle to spit excess saliva  SKIN: skin color, texture, turgor are normal, no rashes or significant lesions EYES: normal, Conjunctiva are pink and non-injected (+) scleral  icterus OROPHARYNX:no exudate, no erythema and lips, buccal mucosa, and tongue normal  NECK: supple, thyroid normal size, non-tender, without nodularity LYMPH:  no palpable lymphadenopathy in the cervical, axillary or inguinal LUNGS: clear to auscultation and percussion with normal breathing effort HEART: regular rate & rhythm and no murmurs and no lower extremity edema ABDOMEN:abdomen soft, non-tender and normal bowel sounds (+) feeding tube, changes pad every day Musculoskeletal:no cyanosis of digits and no clubbing  NEURO: alert & oriented x 3 with fluent speech, no focal motor/sensory deficits  LABORATORY DATA:  I have reviewed the data as listed CBC Latest Ref Rng &  Units 11/07/2018 10/31/2018 10/17/2018  WBC 4.0 - 10.5 K/uL 8.0 10.6(H) 8.2  Hemoglobin 13.0 - 17.0 g/dL 14.1 13.9 13.8  Hematocrit 39.0 - 52.0 % 46.5 47.3 47.9  Platelets 150 - 400 K/uL 285 357 387     CMP Latest Ref Rng & Units 11/07/2018 10/31/2018 10/18/2018  Glucose 70 - 99 mg/dL 389(H) 474(H) 237(H)  BUN 6 - 20 mg/dL 12 18 11   Creatinine 0.61 - 1.24 mg/dL 0.85 0.85 0.71  Sodium 135 - 145 mmol/L 136 137 136  Potassium 3.5 - 5.1 mmol/L 4.1 4.0 3.6  Chloride 98 - 111 mmol/L 97(L) 95(L) 100  CO2 22 - 32 mmol/L 29 32 26  Calcium 8.9 - 10.3 mg/dL 9.5 9.5 8.8(L)  Total Protein 6.5 - 8.1 g/dL 7.3 7.5 -  Total Bilirubin 0.3 - 1.2 mg/dL 8.0(HH) 1.4(H) -  Alkaline Phos 38 - 126 U/L 616(H) 350(H) -  AST 15 - 41 U/L 235(HH) 84(H) -  ALT 0 - 44 U/L 503(HH) 240(H) -      RADIOGRAPHIC STUDIES: I have personally reviewed the radiological images as listed and agreed with the findings in the report. US Abdomen Complete  Result Date: 11/07/2018 CLINICAL DATA:  Jaundice, rule out biliary obstruction EXAM: ABDOMEN ULTRASOUND COMPLETE COMPARISON:  CT 08/22/2018 FINDINGS: Gallbladder: Gallbladder is distended to 6 cm in diameter. There is sludge within the gallbladder. No echogenic gallstones. Negative sonographic Murphy's  sign. No pericholecystic fluid. Significant gallbladder wall thickening. Common bile duct: Diameter: Dilated 9 mm. Liver: No focal lesion identified. Within normal limits in parenchymal echogenicity. Portal vein is patent on color Doppler imaging with normal direction of blood flow towards the liver. IVC: No abnormality visualized. Pancreas: Visualized portion unremarkable. Spleen: Size and appearance within normal limits. Right Kidney: Length: 12.6 cm. Cystic lesion with thin septation is coming anechoic measures 2.3 cm. This corresponds to low-density cyst on comparison CT. Left Kidney: Length: 14.3 cm.  There is Abdominal aorta: No aneurysm visualized. Other findings: None. IMPRESSION: 1. Dilatation of the common bile duct and gallbladder without obstructing lesion identified. No gallstones are evident but there is sludge within the lumen gallbladder. Consider contrast MRCP to further evaluate etiology of potential distal biliary obstruction. 2. No intrahepatic biliary duct dilatation identified. Electronically Signed   By: Suzy Bouchard M.D.   On: 11/07/2018 14:52     ASSESSMENT & PLAN:  Darrell Sutton is a 57 y.o. male with history of  1.  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  His initial PET scan showed diffuse lymph no adenopathy from supraclavicular region to retroperitoneal space, consistent with metastatic disease.  -Due to his significant dysphagia, he underwent PEG feeding tube placement, currently on tube feeds, nothing by mouth, and he started with palliative radiation, plan to finish on 12/26.  -He still has significant dysphagia, still not able to swallow his saliva, or drink any liquid.  -He experiences pain and burning with radiation and thinks that it's not helping. He feels dehydrated and fatigued. He tries to stay active.  -Labs reviewed, Cbc showed RBC 6.08.  He has developed new transaminitis with liver enzymes in 500s, and significant  hyperbilirubinemia, will set up ultrasound today. -He has scleral icterus, but denies tea colored urine. His urine is dark yellow due to dehydration. He denies significant right upper quadrant abdominal pain, no tenderness, no fever or chills. -We discussed systemic chemotherapy, which will start when he recovers well from radiation.  I will see him  back in 2 weeks to see if he is ready to start chemo.  2. History of gastric by-pass surgery    3. Type 2 DM with uncontrolled hyperglycemia -Currently on Insulin. He also sees our dietician.  4.  Hypertension -His medications were switched to liquid form so he can swallow. He was not able to find Lopressor liquid HCTZ, and was therefore given Clonidine 0.1 mg patches on 10/31/2018 for 7 days.  5. Insomnia -Currently on Ativan 0.5 mg at bedtime as needed. Continue. -He wakes up at night to spit excess saliva.   6. Nausea -He is currently on Scopolamine patch, which helps reduce salivary secretions   7.  Transaminitis and hyperbilirubinemia -New today, bilirubin 8.0, ALT and AST in 500's  -will get abdominal US today   Plan  -IVF today with NS 1.0L over 2 hrs, iv zofran 8mg  once  -abdominal US this afternoon  -f/u in 2 weeks with labs   No problem-specific Assessment & Plan notes found for this encounter.   Orders Placed This Encounter  Procedures  . US Abdomen Complete    Standing Status:   Future    Number of Occurrences:   1    Standing Expiration Date:   11/07/2019    Order Specific Question:   Reason for Exam (SYMPTOM  OR DIAGNOSIS REQUIRED)    Answer:   jaundice, rule out bile obstruction    Order Specific Question:   Preferred imaging location?    Answer:   [facility]   All questions were answered. The patient knows to call the clinic with any problems, questions or concerns. No barriers to learning was detected. I spent 20 minutes counseling the patient face to face. The total time spent in the appointment was  25 minutes and more than 50% was on counseling and review of test results  I, Noor Dweik am acting as scribe for Dr. Truitt Merle.  I have reviewed the above documentation for accuracy and completeness, and I agree with the above.     Truitt Merle, MD 11/07/2018   Addendum  His abdominal ultrasound showed dilated CBD and gallbladder.  No significant liver metastasis ultrasound.  I spoke with the radiologist Dr. Leonia Reeves. I will send pt to [facility] ED for further evaluation, such as CT or MRCP, and he will likely needs to be admitted. I am on call, and will f/u him tomorrow. I informed ED charge nurse. Pt agrees with the plan.  Truitt Merle  11/07/2018 3:17 PM

## 2018-11-06 NOTE — Progress Notes (Signed)
Patient did not show up for nutrition appointment. 

## 2018-11-07 ENCOUNTER — Inpatient Hospital Stay: Payer: Self-pay

## 2018-11-07 ENCOUNTER — Telehealth: Payer: Self-pay | Admitting: Hematology

## 2018-11-07 ENCOUNTER — Inpatient Hospital Stay: Payer: Self-pay | Admitting: Medical

## 2018-11-07 ENCOUNTER — Inpatient Hospital Stay (HOSPITAL_BASED_OUTPATIENT_CLINIC_OR_DEPARTMENT_OTHER): Payer: Self-pay | Admitting: Hematology

## 2018-11-07 ENCOUNTER — Ambulatory Visit
Admission: RE | Admit: 2018-11-07 | Discharge: 2018-11-07 | Disposition: A | Discharge status: Home / Self Care | Payer: Self-pay | Source: Ambulatory Visit | Attending: Radiation Oncology | Admitting: Radiation Oncology

## 2018-11-07 ENCOUNTER — Inpatient Hospital Stay (HOSPITAL_COMMUNITY)
Admission: EM | Admit: 2018-11-07 | Discharge: 2018-11-11 | DRG: 445 | Disposition: A | Discharge status: Home Health | Payer: Self-pay | Attending: Internal Medicine | Admitting: Internal Medicine

## 2018-11-07 ENCOUNTER — Encounter: Payer: Self-pay | Admitting: Hematology

## 2018-11-07 ENCOUNTER — Encounter (HOSPITAL_COMMUNITY): Payer: Self-pay | Admitting: Emergency Medicine

## 2018-11-07 ENCOUNTER — Observation Stay (HOSPITAL_COMMUNITY): Payer: Self-pay

## 2018-11-07 ENCOUNTER — Ambulatory Visit (HOSPITAL_COMMUNITY)
Admission: RE | Admit: 2018-11-07 | Discharge: 2018-11-07 | Disposition: A | Discharge status: Home / Self Care | Payer: Self-pay | Source: Ambulatory Visit | Attending: Hematology | Admitting: Hematology

## 2018-11-07 VITALS — BP 168/96 | HR 101 | Temp 98.1°F | Resp 16 | Ht 72.0 in | Wt 283.3 lb

## 2018-11-07 DIAGNOSIS — I10 Essential (primary) hypertension: Secondary | ICD-10-CM

## 2018-11-07 DIAGNOSIS — R1319 Other dysphagia: Secondary | ICD-10-CM | POA: Diagnosis present

## 2018-11-07 DIAGNOSIS — C155 Malignant neoplasm of lower third of esophagus: Secondary | ICD-10-CM

## 2018-11-07 DIAGNOSIS — E1165 Type 2 diabetes mellitus with hyperglycemia: Secondary | ICD-10-CM | POA: Diagnosis present

## 2018-11-07 DIAGNOSIS — Z833 Family history of diabetes mellitus: Secondary | ICD-10-CM

## 2018-11-07 DIAGNOSIS — Z66 Do not resuscitate: Secondary | ICD-10-CM | POA: Diagnosis present

## 2018-11-07 DIAGNOSIS — Z8711 Personal history of peptic ulcer disease: Secondary | ICD-10-CM

## 2018-11-07 DIAGNOSIS — Z931 Gastrostomy status: Secondary | ICD-10-CM

## 2018-11-07 DIAGNOSIS — Z7982 Long term (current) use of aspirin: Secondary | ICD-10-CM

## 2018-11-07 DIAGNOSIS — Z95828 Presence of other vascular implants and grafts: Secondary | ICD-10-CM

## 2018-11-07 DIAGNOSIS — Z794 Long term (current) use of insulin: Secondary | ICD-10-CM

## 2018-11-07 DIAGNOSIS — Z79891 Long term (current) use of opiate analgesic: Secondary | ICD-10-CM

## 2018-11-07 DIAGNOSIS — Z79899 Other long term (current) drug therapy: Secondary | ICD-10-CM

## 2018-11-07 DIAGNOSIS — F419 Anxiety disorder, unspecified: Secondary | ICD-10-CM | POA: Diagnosis present

## 2018-11-07 DIAGNOSIS — E1151 Type 2 diabetes mellitus with diabetic peripheral angiopathy without gangrene: Secondary | ICD-10-CM | POA: Diagnosis present

## 2018-11-07 DIAGNOSIS — R110 Nausea: Secondary | ICD-10-CM

## 2018-11-07 DIAGNOSIS — K828 Other specified diseases of gallbladder: Secondary | ICD-10-CM | POA: Insufficient documentation

## 2018-11-07 DIAGNOSIS — C778 Secondary and unspecified malignant neoplasm of lymph nodes of multiple regions: Secondary | ICD-10-CM

## 2018-11-07 DIAGNOSIS — Z8349 Family history of other endocrine, nutritional and metabolic diseases: Secondary | ICD-10-CM

## 2018-11-07 DIAGNOSIS — K838 Other specified diseases of biliary tract: Secondary | ICD-10-CM | POA: Insufficient documentation

## 2018-11-07 DIAGNOSIS — K831 Obstruction of bile duct: Secondary | ICD-10-CM

## 2018-11-07 DIAGNOSIS — E118 Type 2 diabetes mellitus with unspecified complications: Secondary | ICD-10-CM

## 2018-11-07 DIAGNOSIS — R17 Unspecified jaundice: Secondary | ICD-10-CM | POA: Insufficient documentation

## 2018-11-07 DIAGNOSIS — Y842 Radiological procedure and radiotherapy as the cause of abnormal reaction of the patient, or of later complication, without mention of misadventure at the time of the procedure: Secondary | ICD-10-CM | POA: Diagnosis present

## 2018-11-07 DIAGNOSIS — E669 Obesity, unspecified: Secondary | ICD-10-CM | POA: Diagnosis present

## 2018-11-07 DIAGNOSIS — Z8249 Family history of ischemic heart disease and other diseases of the circulatory system: Secondary | ICD-10-CM

## 2018-11-07 DIAGNOSIS — E785 Hyperlipidemia, unspecified: Secondary | ICD-10-CM | POA: Diagnosis present

## 2018-11-07 DIAGNOSIS — G4700 Insomnia, unspecified: Secondary | ICD-10-CM

## 2018-11-07 DIAGNOSIS — E860 Dehydration: Secondary | ICD-10-CM

## 2018-11-07 DIAGNOSIS — N281 Cyst of kidney, acquired: Secondary | ICD-10-CM | POA: Diagnosis present

## 2018-11-07 DIAGNOSIS — K8050 Calculus of bile duct without cholangitis or cholecystitis without obstruction: Secondary | ICD-10-CM | POA: Diagnosis present

## 2018-11-07 DIAGNOSIS — I2510 Atherosclerotic heart disease of native coronary artery without angina pectoris: Secondary | ICD-10-CM | POA: Diagnosis present

## 2018-11-07 DIAGNOSIS — Z823 Family history of stroke: Secondary | ICD-10-CM

## 2018-11-07 DIAGNOSIS — K8031 Calculus of bile duct with cholangitis, unspecified, with obstruction: Principal | ICD-10-CM | POA: Diagnosis present

## 2018-11-07 DIAGNOSIS — Z955 Presence of coronary angioplasty implant and graft: Secondary | ICD-10-CM

## 2018-11-07 DIAGNOSIS — K222 Esophageal obstruction: Secondary | ICD-10-CM | POA: Diagnosis present

## 2018-11-07 DIAGNOSIS — Z9884 Bariatric surgery status: Secondary | ICD-10-CM

## 2018-11-07 DIAGNOSIS — Z934 Other artificial openings of gastrointestinal tract status: Secondary | ICD-10-CM

## 2018-11-07 DIAGNOSIS — R1310 Dysphagia, unspecified: Secondary | ICD-10-CM

## 2018-11-07 DIAGNOSIS — Z800 Family history of malignant neoplasm of digestive organs: Secondary | ICD-10-CM

## 2018-11-07 DIAGNOSIS — I252 Old myocardial infarction: Secondary | ICD-10-CM

## 2018-11-07 DIAGNOSIS — K219 Gastro-esophageal reflux disease without esophagitis: Secondary | ICD-10-CM | POA: Diagnosis present

## 2018-11-07 DIAGNOSIS — R7989 Other specified abnormal findings of blood chemistry: Secondary | ICD-10-CM | POA: Diagnosis present

## 2018-11-07 DIAGNOSIS — R740 Nonspecific elevation of levels of transaminase and lactic acid dehydrogenase [LDH]: Secondary | ICD-10-CM

## 2018-11-07 DIAGNOSIS — K259 Gastric ulcer, unspecified as acute or chronic, without hemorrhage or perforation: Secondary | ICD-10-CM | POA: Diagnosis present

## 2018-11-07 DIAGNOSIS — R945 Abnormal results of liver function studies: Secondary | ICD-10-CM | POA: Diagnosis present

## 2018-11-07 DIAGNOSIS — C159 Malignant neoplasm of esophagus, unspecified: Secondary | ICD-10-CM | POA: Diagnosis present

## 2018-11-07 LAB — PROTIME-INR
INR: 1.11
Prothrombin Time: 14.2 seconds (ref 11.4–15.2)

## 2018-11-07 LAB — CBC
HCT: 46.3 % (ref 39.0–52.0)
Hemoglobin: 13.4 g/dL (ref 13.0–17.0)
MCH: 23.3 pg — ABNORMAL LOW (ref 26.0–34.0)
MCHC: 28.9 g/dL — ABNORMAL LOW (ref 30.0–36.0)
MCV: 80.4 fL (ref 80.0–100.0)
Platelets: 273 10*3/uL (ref 150–400)
RBC: 5.76 MIL/uL (ref 4.22–5.81)
RDW: 19 % — AB (ref 11.5–15.5)
WBC: 7.4 10*3/uL (ref 4.0–10.5)
nRBC: 0 % (ref 0.0–0.2)

## 2018-11-07 LAB — CBC WITH DIFFERENTIAL (CANCER CENTER ONLY)
Abs Immature Granulocytes: 0.04 10*3/uL (ref 0.00–0.07)
Basophils Absolute: 0 10*3/uL (ref 0.0–0.1)
Basophils Relative: 0 %
Eosinophils Absolute: 0.1 10*3/uL (ref 0.0–0.5)
Eosinophils Relative: 2 %
HCT: 46.5 % (ref 39.0–52.0)
Hemoglobin: 14.1 g/dL (ref 13.0–17.0)
Immature Granulocytes: 1 %
Lymphocytes Relative: 3 %
Lymphs Abs: 0.2 10*3/uL — ABNORMAL LOW (ref 0.7–4.0)
MCH: 23.2 pg — ABNORMAL LOW (ref 26.0–34.0)
MCHC: 30.3 g/dL (ref 30.0–36.0)
MCV: 76.5 fL — ABNORMAL LOW (ref 80.0–100.0)
Monocytes Absolute: 0.6 10*3/uL (ref 0.1–1.0)
Monocytes Relative: 7 %
Neutro Abs: 7.1 10*3/uL (ref 1.7–7.7)
Neutrophils Relative %: 87 %
Platelet Count: 285 10*3/uL (ref 150–400)
RBC: 6.08 MIL/uL — AB (ref 4.22–5.81)
RDW: 18.5 % — ABNORMAL HIGH (ref 11.5–15.5)
WBC Count: 8 10*3/uL (ref 4.0–10.5)
nRBC: 0 % (ref 0.0–0.2)

## 2018-11-07 LAB — COMPREHENSIVE METABOLIC PANEL
ALT: 443 U/L — ABNORMAL HIGH (ref 0–44)
AST: 206 U/L — ABNORMAL HIGH (ref 15–41)
Albumin: 3.2 g/dL — ABNORMAL LOW (ref 3.5–5.0)
Alkaline Phosphatase: 486 U/L — ABNORMAL HIGH (ref 38–126)
Anion gap: 11 (ref 5–15)
BUN: 14 mg/dL (ref 6–20)
CO2: 27 mmol/L (ref 22–32)
Calcium: 8.9 mg/dL (ref 8.9–10.3)
Chloride: 99 mmol/L (ref 98–111)
Creatinine, Ser: 0.55 mg/dL — ABNORMAL LOW (ref 0.61–1.24)
GFR calc Af Amer: >60 mL/min (ref >60)
GFR calc non Af Amer: >60 mL/min (ref >60)
Glucose, Bld: 326 mg/dL — ABNORMAL HIGH (ref 70–99)
Potassium: 4.1 mmol/L (ref 3.5–5.1)
SODIUM: 137 mmol/L (ref 135–145)
Total Bilirubin: 7.6 mg/dL — ABNORMAL HIGH (ref 0.3–1.2)
Total Protein: 7 g/dL (ref 6.5–8.1)

## 2018-11-07 LAB — CMP (CANCER CENTER ONLY)
ALT: 503 U/L (ref 0–44)
AST: 235 U/L — AB (ref 15–41)
Albumin: 3 g/dL — ABNORMAL LOW (ref 3.5–5.0)
Alkaline Phosphatase: 616 U/L — ABNORMAL HIGH (ref 38–126)
Anion gap: 10 (ref 5–15)
BUN: 12 mg/dL (ref 6–20)
CO2: 29 mmol/L (ref 22–32)
Calcium: 9.5 mg/dL (ref 8.9–10.3)
Chloride: 97 mmol/L — ABNORMAL LOW (ref 98–111)
Creatinine: 0.85 mg/dL (ref 0.61–1.24)
GFR, Est AFR Am: >60 mL/min (ref >60)
GFR, Estimated: >60 mL/min (ref >60)
Glucose, Bld: 389 mg/dL — ABNORMAL HIGH (ref 70–99)
Potassium: 4.1 mmol/L (ref 3.5–5.1)
Sodium: 136 mmol/L (ref 135–145)
Total Bilirubin: 8 mg/dL (ref 0.3–1.2)
Total Protein: 7.3 g/dL (ref 6.5–8.1)

## 2018-11-07 LAB — GLUCOSE, CAPILLARY: Glucose-Capillary: 264 mg/dL — ABNORMAL HIGH (ref 70–99)

## 2018-11-07 LAB — LIPASE, BLOOD: Lipase: 26 U/L (ref 11–51)

## 2018-11-07 MED ORDER — SCOPOLAMINE 1 MG/3DAYS TD PT72
1.0000 | MEDICATED_PATCH | TRANSDERMAL | Status: DC
  Administered 2018-11-10: 1.5 mg via TRANSDERMAL
  Filled 2018-11-07: qty 1

## 2018-11-07 MED ORDER — HEPARIN SOD (PORK) LOCK FLUSH 100 UNIT/ML IV SOLN
500.0000 [IU] | Freq: Once | INTRAVENOUS | Status: AC
  Administered 2018-11-07: 500 [IU] via INTRAVENOUS
  Filled 2018-11-07: qty 5

## 2018-11-07 MED ORDER — SODIUM CHLORIDE 0.9 % IV SOLN: 8.0000 mg | Freq: Once | INTRAVENOUS | Status: DC

## 2018-11-07 MED ORDER — SODIUM CHLORIDE 0.9 % IV SOLN
INTRAVENOUS | Status: DC
  Administered 2018-11-07 – 2018-11-10 (×4): via INTRAVENOUS

## 2018-11-07 MED ORDER — INSULIN ASPART PROT & ASPART (70-30 MIX) 100 UNIT/ML ~~LOC~~ SUSP
7.0000 [IU] | Freq: Two times a day (BID) | SUBCUTANEOUS | Status: DC
  Administered 2018-11-10 – 2018-11-11 (×2): 7 [IU] via SUBCUTANEOUS
  Filled 2018-11-07: qty 10

## 2018-11-07 MED ORDER — ONDANSETRON HCL 4 MG/2ML IJ SOLN
INTRAMUSCULAR | Status: AC
  Filled 2018-11-07: qty 4

## 2018-11-07 MED ORDER — POLYETHYLENE GLYCOL 3350 17 G PO PACK: 17.0000 g | PACK | Freq: Every day | ORAL | Status: DC | PRN

## 2018-11-07 MED ORDER — ONDANSETRON HCL 4 MG/2ML IJ SOLN
8.0000 mg | Freq: Once | INTRAMUSCULAR | Status: AC
  Administered 2018-11-07: 8 mg via INTRAVENOUS

## 2018-11-07 MED ORDER — ONDANSETRON HCL 4 MG PO TABS: 4.0000 mg | ORAL_TABLET | Freq: Four times a day (QID) | ORAL | Status: DC | PRN

## 2018-11-07 MED ORDER — INSULIN ASPART 100 UNIT/ML ~~LOC~~ SOLN
0.0000 [IU] | Freq: Three times a day (TID) | SUBCUTANEOUS | Status: DC
  Administered 2018-11-08: 7 [IU] via SUBCUTANEOUS
  Administered 2018-11-08: 4 [IU] via SUBCUTANEOUS
  Administered 2018-11-08: 11 [IU] via SUBCUTANEOUS
  Administered 2018-11-09: 4 [IU] via SUBCUTANEOUS
  Administered 2018-11-09: 7 [IU] via SUBCUTANEOUS
  Administered 2018-11-09: 11 [IU] via SUBCUTANEOUS
  Administered 2018-11-10 (×3): 7 [IU] via SUBCUTANEOUS
  Administered 2018-11-11: 11 [IU] via SUBCUTANEOUS
  Administered 2018-11-11: 7 [IU] via SUBCUTANEOUS

## 2018-11-07 MED ORDER — SODIUM CHLORIDE 0.9 % IV BOLUS
500.0000 mL | Freq: Once | INTRAVENOUS | Status: AC
  Administered 2018-11-07: 500 mL via INTRAVENOUS

## 2018-11-07 MED ORDER — SODIUM CHLORIDE 0.9 % IV SOLN
INTRAVENOUS | Status: AC
  Administered 2018-11-07: 11:00:00 via INTRAVENOUS
  Filled 2018-11-07 (×2): qty 250

## 2018-11-07 MED ORDER — SODIUM CHLORIDE 0.9% FLUSH
10.0000 mL | INTRAVENOUS | Status: DC | PRN
  Administered 2018-11-07: 10 mL via INTRAVENOUS
  Filled 2018-11-07: qty 10

## 2018-11-07 MED ORDER — ONDANSETRON HCL 4 MG/2ML IJ SOLN
4.0000 mg | Freq: Four times a day (QID) | INTRAMUSCULAR | Status: DC | PRN
  Administered 2018-11-09 – 2018-11-11 (×6): 4 mg via INTRAVENOUS
  Filled 2018-11-07 (×6): qty 2

## 2018-11-07 MED ORDER — INSULIN ASPART 100 UNIT/ML ~~LOC~~ SOLN
0.0000 [IU] | Freq: Every day | SUBCUTANEOUS | Status: DC
  Administered 2018-11-07: 2 [IU] via SUBCUTANEOUS

## 2018-11-07 MED ORDER — CLONIDINE HCL 0.1 MG/24HR TD PTWK: 0.1000 mg | MEDICATED_PATCH | TRANSDERMAL | Status: DC

## 2018-11-07 MED ORDER — LORAZEPAM 0.5 MG PO TABS: 0.5000 mg | ORAL_TABLET | Freq: Four times a day (QID) | ORAL | Status: DC | PRN

## 2018-11-07 MED ORDER — GADOBUTROL 1 MMOL/ML IV SOLN
10.0000 mL | Freq: Once | INTRAVENOUS | Status: AC | PRN
  Administered 2018-11-07: 10 mL via INTRAVENOUS

## 2018-11-07 MED ORDER — ENOXAPARIN SODIUM 60 MG/0.6ML ~~LOC~~ SOLN
60.0000 mg | SUBCUTANEOUS | Status: DC
  Administered 2018-11-07: 60 mg via SUBCUTANEOUS
  Filled 2018-11-07: qty 0.6

## 2018-11-07 NOTE — ED Provider Notes (Signed)
[facility] Emergency Department Provider Note MRN:  [id number]  [place] date & time: 11/07/18     Chief Complaint   Abnormal Lab   History of Present Illness   Darrell Sutton is a 57 y.o. year-old male with a history of stage IV esophageal cancer presenting to the ED with chief complaint of abnormal lab.  Patient went to his oncologist this morning for his normal scheduled radiation therapy.  He was found to be jaundiced, labs were performed confirming an elevated bilirubin, elevated LFTs.  Ultrasound of the abdomen was performed, showing concerns for biliary obstruction.  Sent here for further evaluation.  Patient denies any fevers or chills, no headache or vision change, no chest pain or shortness of breath, no abdominal pain.  Review of Systems  A complete 10 system review of systems was obtained and all systems are negative except as noted in the HPI and PMH.   Patient's Health History    Past Medical History:  Diagnosis Date  . Anxiety 2009   started with divorce  . Cancer [facility])    cancer of lower third of esophagus  . Diabetes mellitus without complication ([place])   . Gastric ulcer    from NSAID overuse  . GERD (gastroesophageal reflux disease)   . Hyperlipidemia   . Hypertension   . MI (myocardial infarction) ([place]) 2011   has two stents  . Obesity    highest weight in 500's    Past Surgical History:  Procedure Laterality Date  . [place] SURGERY  2010  . core biopsy  10/11/2018   core biopsy of lymph nodes in Interventional Radiology  . GASTRIC BYPASS  11/24/2008  . GASTROJEJUNOSTOMY N/A 10/17/2018   Procedure: LAPAROSCOPIC PLACEMENT OF  LEFT FEEDING JEJUNOSTOMY TUBE;  Surgeon: Masyn Rostro Boston, MD;  Location: [facility];  Service: General;  Laterality: N/A;  . JOINT REPLACEMENT     Bilateral knee  . PORTACATH PLACEMENT Right 10/17/2018   Procedure: INSERTION PORT-A-CATH WITH ULTRASOUND AND FLUORO;  Surgeon: Michol Emory Boston, MD;  Location: [facility];   Service: General;  Laterality: Right;  . TONSILECTOMY/ADENOIDECTOMY WITH MYRINGOTOMY      Family History  Problem Relation Age of Onset  . Hyperlipidemia Mother   . Hypertension Mother   . Hyperlipidemia Father   . Hypertension Father   . Stroke Father   . Cancer Father        NHL  . Diabetes Brother   . Heart attack Brother 32  . Hyperlipidemia Brother   . Hypertension Brother   . Heart attack Maternal Grandmother   . Heart attack Maternal Grandfather   . Early death Maternal Grandfather   . Esophageal cancer Paternal Grandmother   . Colon cancer Paternal Grandfather   . Colon cancer Cousin   . Stomach cancer Neg Hx     Social History   Socioeconomic History  . Marital status: Married    Spouse name: Not on file  . Number of children: 3  . Years of education: Not on file  . Highest education level: Not on file  Occupational History  . Not on file  Social Needs  . Financial resource strain: Not on file  . Food insecurity:    Worry: Not on file    Inability: Not on file  . Transportation needs:    Medical: No    Non-medical: No  Tobacco Use  . Smoking status: Never Smoker  . Smokeless tobacco: Never Used  Substance and Sexual Activity  .  Alcohol use: Never    Frequency: Never  . Drug use: Not Currently    Types: Marijuana    Comment: Youth years- not since earlt 90's  . Sexual activity: Not on file  Lifestyle  . Physical activity:    Days per week: Not on file    Minutes per session: Not on file  . Stress: Not on file  Relationships  . Social connections:    Talks on phone: Not on file    Gets together: Not on file    Attends religious service: Not on file    Active member of club or organization: Not on file    Attends meetings of clubs or organizations: Not on file    Relationship status: Not on file  . Intimate partner violence:    Fear of current or ex partner: Not on file    Emotionally abused: Not on file    Physically abused: Not on file     Forced sexual activity: Not on file  Other Topics Concern  . Not on file  Social History Narrative   From [place], moved here Sep 2019 with his wife and son   Son with autism, starting at [employer]        Physical Exam  Vital Signs and Nursing Notes reviewed Vitals:   11/07/18 1900 11/07/18 2110  BP: (!) 158/95 (!) 157/82  Pulse: 84 76  Resp: 18 18  Temp: 98.2 F (36.8 C) 98.2 F (36.8 C)  SpO2:  100%    CONSTITUTIONAL: Well-appearing, NAD, mildly jaundiced NEURO:  Alert and oriented x 3, no focal deficits EYES:  eyes equal and reactive, mild scleral icterus ENT/NECK:  no LAD, no JVD CARDIO: Regular rate, well-perfused, normal S1 and S2 PULM:  CTAB no wheezing or rhonchi GI/GU:  normal bowel sounds, non-distended, non-tender, feeding tube in place MSK/SPINE:  No gross deformities, no edema SKIN:  no rash, atraumatic PSYCH:  Appropriate speech and behavior  Diagnostic and Interventional Summary    Labs Reviewed  CBC - Abnormal; Notable for the following components:      Result Value   MCH 23.3 (*)    MCHC 28.9 (*)    RDW 19.0 (*)    All other components within normal limits  COMPREHENSIVE METABOLIC PANEL - Abnormal; Notable for the following components:   Glucose, Bld 326 (*)    Creatinine, Ser 0.55 (*)    Albumin 3.2 (*)    AST 206 (*)    ALT 443 (*)    Alkaline Phosphatase 486 (*)    Total Bilirubin 7.6 (*)    All other components within normal limits  PROTIME-INR  LIPASE, BLOOD  HIV ANTIBODY (ROUTINE TESTING W REFLEX)  COMPREHENSIVE METABOLIC PANEL  CBC  PROTIME-INR    MR ABDOMEN MRCP W WO CONTAST  Final Result    MR 3D Recon At Scanner  Final Result      Medications  cloNIDine (CATAPRES - Dosed in mg/24 hr) patch 0.1 mg (has no administration in time range)  LORazepam (ATIVAN) tablet 0.5 mg (has no administration in time range)  scopolamine (TRANSDERM-SCOP) 1 MG/3DAYS 1.5 mg (has no administration in time range)  enoxaparin (LOVENOX)  injection 60 mg (has no administration in time range)  0.9 %  sodium chloride infusion (has no administration in time range)  polyethylene glycol (MIRALAX / GLYCOLAX) packet 17 g (has no administration in time range)  ondansetron (ZOFRAN) tablet 4 mg (has no administration in time range)  Or  ondansetron (ZOFRAN) injection 4 mg (has no administration in time range)  insulin aspart protamine- aspart (NOVOLOG MIX 70/30) injection 7 Units (has no administration in time range)  insulin aspart (novoLOG) injection 0-20 Units (has no administration in time range)  insulin aspart (novoLOG) injection 0-5 Units (has no administration in time range)  sodium chloride 0.9 % bolus 500 mL (0 mLs Intravenous Stopped 11/07/18 1721)  gadobutrol (GADAVIST) 1 MMOL/ML injection 10 mL (10 mLs Intravenous Contrast Given 11/07/18 1832)     Procedures Critical Care  ED Course and Medical Decision Making  I have reviewed the triage vital signs and the nursing notes.  Pertinent labs & imaging results that were available during my care of the patient were reviewed by me and considered in my medical decision making (see below for details).  Unclear etiology of biliary obstruction, question of sludge versus neoplastic obstruction given patient's history.  Will likely need MRCP, will consult GI, push for admission.  Discussed with low by our GI, who recommended ordering the MRCP to determine need for possible ERCP.  Admitted to hospital service for further care and evaluation.  Barth Kirks. Sedonia Small, MD [place] [email or web address]  Final Clinical Impressions(s) / ED Diagnoses     ICD-10-CM   1. Jaundice R17 MR 3D Recon At Scanner    MR 3D Recon At [place]    ED Discharge Orders    None         Maudie Flakes, MD 11/07/18 2130

## 2018-11-07 NOTE — H&P (Signed)
History and Physical    Darrell Sutton ZCH:[id number] DOB: 12/29/1960 DOA: 11/07/2018  PCP: [name], PA   Patient coming from: Home  I have personally briefly reviewed patient's old medical records in [place]  Chief Complaint: Weakness, jaundice and abnormal labs  HPI: Darrell Sutton is a 57 y.o. 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 [name] office with complaints of not feeling well for the last few days.  He was feeling dehydrated with very low energy level.  His feeding tube was functioning but he had intermittent nausea.  No fevers or chills.  No worsening abdominal pain.  No shortness of breath, cough, diarrhea, dysuria, loss of consciousness or seizures.  He was found to have abnormal LFTs and ultrasound of abdomen showed dilated common bile duct.  He was sent to the ED.  ED Course: MRCP has been ordered.  Gastroenterology on call was notified.  Hospitalist service was called to evaluate the patient  Review of Systems: As per HPI otherwise all other 10 point systems were reviewed and were negative.   Past Medical History:  Diagnosis Date  . Anxiety 2009   started with divorce  . Cancer [facility])    cancer of lower third of esophagus  . Diabetes mellitus without complication ([place])   . Gastric ulcer    from NSAID overuse  . GERD (gastroesophageal reflux disease)   . Hyperlipidemia   . Hypertension   . [place]   has two stents  . Obesity    highest weight in 500's    Past Surgical History:  Procedure Laterality Date  . [place] SURGERY  2010  . core biopsy  10/11/2018   core biopsy of lymph nodes in Interventional Radiology   . GASTRIC BYPASS  11/24/2008  . GASTROJEJUNOSTOMY N/A 10/17/2018   Procedure: LAPAROSCOPIC PLACEMENT OF  LEFT FEEDING JEJUNOSTOMY TUBE;  Surgeon: Michael Boston, MD;  Location: [facility];  Service: General;  Laterality: N/A;  . JOINT REPLACEMENT     Bilateral knee  . PORTACATH PLACEMENT Right 10/17/2018   Procedure: INSERTION PORT-A-CATH WITH ULTRASOUND AND FLUORO;  Surgeon: Michael Boston, MD;  Location: [facility];  Service: General;  Laterality: Right;  . TONSILECTOMY/ADENOIDECTOMY WITH MYRINGOTOMY     Social history  reports that he has never smoked. He has never used smokeless tobacco. He reports previous drug use. Drug: Marijuana. He reports that he does not drink alcohol.  Allergies  Allergen Reactions  . Penicillins Hives    Has patient had a PCN reaction causing immediate rash, facial/tongue/throat swelling, SOB or lightheadedness with hypotension: Yes Has patient had a PCN reaction causing severe rash involving mucus membranes or skin necrosis: No Has patient had a PCN reaction that required hospitalization: No; was already in hosp Has patient had a PCN reaction occurring within the last 10 years: No If all of the above answers are "NO", then may proceed with Cephalosporin use.     Family History  Problem Relation Age of Onset  . Hyperlipidemia Mother   . Hypertension Mother   . Hyperlipidemia Father   . Hypertension Father   . Stroke Father   . Cancer Father        NHL  . Diabetes Brother   . Heart attack  Brother 42  . Hyperlipidemia Brother   . Hypertension Brother   . Heart attack Maternal Grandmother   . Heart attack Maternal Grandfather   . Early death Maternal Grandfather   . Esophageal cancer Paternal Grandmother   . Colon cancer Paternal Grandfather   . Colon cancer Cousin   . Stomach cancer Neg Hx     Prior to Admission medications   Medication Sig Start Date End Date Taking? Authorizing Provider  aspirin EC 81 MG tablet Take 81 mg by mouth daily.   Yes  [provider]  calcium carbonate (TUMS - DOSED IN MG ELEMENTAL CALCIUM) 500 MG chewable tablet Chew 2 tablets by mouth daily as needed for indigestion or heartburn.   Yes [provider]  cloNIDine (CATAPRES - DOSED IN MG/24 HR) 0.1 mg/24hr patch Place 1 patch (0.1 mg total) onto the skin once a week. 10/31/18  Yes Tanner, Lyndon Code., [name]  insulin aspart protamine- aspart (NOVOLOG MIX 70/30) (70-30) 100 UNIT/ML injection Inject 0.07 mLs (7 Units total) into the skin 2 (two) times daily with a meal. 10/31/18  Yes Tanner, Lyndon Code., [name]  insulin regular (NOVOLIN R,HUMULIN R) 100 units/mL injection For glucose 151 to 200 use 2 units, for 201 to 250 use 5 units, for 251-300 use 7 units, for 301-350 use 9 units, for 351 to 400 use 12 units. Patient taking differently: Inject 2-12 Units into the skin 2 (two) times daily before a meal. For glucose 151 to 200 use 2 units, for 201 to 250 use 5 units, for 251-300 use 7 units, for 301-350 use 9 units, for 351 to 400 use 12 units. 10/31/18 10/31/19 Yes Tanner, Lyndon Code., [name]  LORazepam (ATIVAN) 0.5 MG tablet Place 1 tablet (0.5 mg total) under the tongue every 6 (six) hours as needed for anxiety. 10/31/18  Yes Tanner, Lyndon Code., [name]  Nutritional Supplements (FEEDING SUPPLEMENT, JEVITY 1.2 CAL,) LIQD Place 1,000 mLs into feeding tube daily. 10/19/18 01/17/19 Yes Michael Boston, MD  ondansetron [facility]) 4 MG/5ML solution Place 5 mLs (4 mg total) into feeding tube every 8 (eight) hours as needed for nausea or vomiting. 10/25/18  Yes [name], PA  scopolamine (TRANSDERM-SCOP) 1 MG/3DAYS Place 1 patch (1.5 mg total) onto the skin every 3 (three) days. 10/31/18  Yes Tanner, Lyndon Code., [name]  hydrochlorothiazide 10 mg/mL SUSP Place 2.5 mLs (25 mg total) into feeding tube daily. Patient not taking: Reported on 11/07/2018 10/31/18 11/30/18  Harle Stanford., [name]  insulin NPH-regular Human (NOVOLIN 70/30) (70-30) 100 UNIT/ML injection Inject 7 Units into the skin 2  (two) times daily with a meal. Relion please give the pen. Patient not taking: Reported on 11/07/2018 10/18/18 11/17/18  Arrien, Jimmy Picket, MD  metoprolol tartrate (LOPRESSOR) 25 mg/10 mL SUSP Place 30 mLs (75 mg total) into feeding tube 2 (two) times daily. Patient not taking: Reported on 11/07/2018 10/31/18 11/30/18  Harle Stanford., [name]  Nutritional Supplements (FEEDING SUPPLEMENT, JEVITY 1.2 CAL,) LIQD Place 1,000 mLs into feeding tube daily. Patient not taking: Reported on 11/07/2018 10/19/18 11/18/18  Michael Boston, MD  oxyCODONE (ROXICODONE) 5 MG/5ML solution Take 5 mLs (5 mg total) by mouth every 6 (six) hours as needed for moderate pain or severe pain. Patient not taking: Reported on 11/07/2018 10/31/18   [name] E., [name]  pantoprazole sodium (PROTONIX) 40 mg/20 mL PACK Place 20 mLs (40 mg total) into feeding tube daily. Patient not taking: Reported on 11/07/2018 10/19/18   Michael Boston, MD  paroxetine (PAXIL) 10 MG/5ML suspension Take 20 mLs (40 mg total) by mouth every morning. Patient not taking: Reported on 11/07/2018 10/31/18 11/30/18  Harle Stanford., [name]  Syringe, Disposable, 3 ML MISC Insulin syringes and needles. Patient not taking: Reported on 11/07/2018 10/18/18   Arrien, Jimmy Picket, MD    Physical Exam: Vitals:   11/07/18 1534  BP: 118/69  Pulse: (!) 48  Resp: 19  Temp: 98.9 F (37.2 C)  TempSrc: Oral  SpO2: 99%    Constitutional: NAD, calm, comfortable Vitals:   11/07/18 1534  BP: 118/69  Pulse: (!) 48  Resp: 19  Temp: 98.9 F (37.2 C)  TempSrc: Oral  SpO2: 99%   Eyes: PERRL, icteric ENMT: Mucous membranes are dry. Posterior pharynx clear of any exudate or lesions. Neck: normal, supple, no masses, no thyromegaly Respiratory: bilateral decreased breath sounds at bases, no wheezing, no crackles. Normal respiratory effort. No accessory muscle use.  Cardiovascular: S1 S2 positive, rate controlled. No extremity edema. 2+ pedal pulses.  Abdomen:  Obese, no tenderness, no masses palpated. No hepatosplenomegaly. Bowel sounds positive.  Jejunostomy tube in place. Musculoskeletal: no clubbing / cyanosis. No joint deformity upper and lower extremities.  Skin: no rashes, lesions, ulcers. No induration Neurologic: CN 2-12 grossly intact. Moving extremities. No focal neurologic deficits.  Psychiatric: Flat affect.   Labs on Admission: I have personally reviewed following labs and imaging studies  CBC: Recent Labs  Lab 11/07/18 0923 11/07/18 1600  WBC 8.0 7.4  NEUTROABS 7.1  --   HGB 14.1 13.4  HCT 46.5 46.3  MCV 76.5* 80.4  PLT 285 765   Basic Metabolic Panel: Recent Labs  Lab 11/07/18 0923 11/07/18 1600  NA 136 137  K 4.1 4.1  CL 97* 99  CO2 29 27  GLUCOSE 389* 326*  BUN 12 14  CREATININE 0.85 0.55*  CALCIUM 9.5 8.9   GFR: Estimated Creatinine Clearance: 141.2 mL/min (A) (by C-G formula based on SCr of 0.55 mg/dL (L)). Liver Function Tests: Recent Labs  Lab 11/07/18 0923 11/07/18 1600  AST 235* 206*  ALT 503* 443*  ALKPHOS 616* 486*  BILITOT 8.0* 7.6*  PROT 7.3 7.0  ALBUMIN 3.0* 3.2*   Recent Labs  Lab 11/07/18 1600  LIPASE 26   No results for input(s): AMMONIA in the last 168 hours. Coagulation Profile: Recent Labs  Lab 11/07/18 1600  INR 1.11   Cardiac Enzymes: No results for input(s): CKTOTAL, CKMB, CKMBINDEX, TROPONINI in the last 168 hours. BNP (last 3 results) No results for input(s): PROBNP in the last 8760 hours. HbA1C: No results for input(s): HGBA1C in the last 72 hours. CBG: No results for input(s): GLUCAP in the last 168 hours. Lipid Profile: No results for input(s): CHOL, HDL, LDLCALC, TRIG, CHOLHDL, LDLDIRECT in the last 72 hours. Thyroid Function Tests: No results for input(s): TSH, T4TOTAL, FREET4, T3FREE, THYROIDAB in the last 72 hours. Anemia Panel: No results for input(s): VITAMINB12, FOLATE, FERRITIN, TIBC, IRON, RETICCTPCT in the last 72 hours. Urine analysis: No  results found for: COLORURINE, APPEARANCEUR, [place], [place], [place], [place], [place], [place], [place], [place], NITRITE, LEUKOCYTESUR  Radiological Exams on Admission: US Abdomen Complete  Result Date: 11/07/2018 CLINICAL DATA:  Jaundice, rule out biliary obstruction EXAM: ABDOMEN ULTRASOUND COMPLETE COMPARISON:  CT 08/22/2018 FINDINGS: Gallbladder: Gallbladder is distended to 6 cm in diameter. There is sludge within the gallbladder. No echogenic gallstones. Negative sonographic Murphy's sign. No pericholecystic fluid. Significant gallbladder wall thickening. Common bile duct: Diameter: Dilated 9 mm. Liver: No  focal lesion identified. Within normal limits in parenchymal echogenicity. Portal vein is patent on color Doppler imaging with normal direction of blood flow towards the liver. IVC: No abnormality visualized. Pancreas: Visualized portion unremarkable. Spleen: Size and appearance within normal limits. Right Kidney: Length: 12.6 cm. Cystic lesion with thin septation is coming anechoic measures 2.3 cm. This corresponds to low-density cyst on comparison CT. Left Kidney: Length: 14.3 cm.  There is Abdominal aorta: No aneurysm visualized. Other findings: None. IMPRESSION: 1. Dilatation of the common bile duct and gallbladder without obstructing lesion identified. No gallstones are evident but there is sludge within the lumen gallbladder. Consider contrast MRCP to further evaluate etiology of potential distal biliary obstruction. 2. No intrahepatic biliary duct dilatation identified. Electronically Signed   By: Suzy Bouchard M.D.   On: 11/07/2018 14:52    Assessment/Plan Principal Problem:   Choledocholithiasis Active Problems:   Obesity   Hypertension   Type 2 diabetes mellitus with complication, without long-term current use of insulin (HCC)   Cancer of lower third of esophagus (HCC)   Mechanical dysphagia from esophageal cancer   Jejunostomy tube present  Abnormal LFTs with  elevated bilirubin with concern for CBD obstruction -No evidence of fever or chills.  White count is normal.  Will not start antibiotics at this time.  -Follow MRCP.  GI has been consulted by ED provider.  Follow GI recommendations -Repeat a.m. labs including LFTs -N.p.o. past midnight in case patient needs ERCP  Metastatic esophageal adenocarcinoma stage IV with dysphagia -Currently undergoing radiation therapy.  Follow oncology recommendations  -Tube feedings will remain on hold until further evaluation by GI  Dehydration  -Normal saline at 100 cc an hour  Obesity -Outpatient follow-up  Hypertension -Monitor blood pressure.  Diabetes mellitus type 2 uncontrolled with hyperglycemia -Continue home regimen.  CBGs with SSI   DVT prophylaxis: Lovenox Code Status: DNR Family Communication: None at bedside Disposition Plan: Home in 1 to 2 days once cleared by GI Consults called: GI called by ED provider Admission status: Observation/MedSurg  Severity of Illness: The appropriate patient status for this patient is OBSERVATION. Observation status is judged to be reasonable and necessary in order to provide the required intensity of service to ensure the patient's safety. The patient's presenting symptoms, physical exam findings, and initial radiographic and laboratory data in the context of their medical condition is felt to place them at decreased risk for further clinical deterioration. Furthermore, it is anticipated that the patient will be medically stable for discharge from the hospital within 2 midnights of admission. The following factors support the patient status of observation.   " The patient's presenting symptoms include weakness. " The physical exam findings include icterus/dehydration. " The initial radiographic and laboratory data are abnormal LFTs/dilated CBD.      Aline August MD [facility] Pager [phone]  If 7PM-7AM, please contact  night-coverage [email or web address] Password [facility]  11/07/2018, 5:52 PM

## 2018-11-07 NOTE — ED Notes (Signed)
Patient transported to MRI 

## 2018-11-07 NOTE — Patient Instructions (Signed)

## 2018-11-07 NOTE — ED Triage Notes (Signed)
Patient reports that he was called with results stating that he had elevated liver enzymes and has a bile duct blockage. Hx of esophageal cancer.

## 2018-11-07 NOTE — Telephone Encounter (Signed)
Spoke with patient about scheduled appointments per 12/24 los.  Printed and mailed calendar.

## 2018-11-07 NOTE — ED Notes (Signed)
Bed: ZU40 Expected date:  Expected time:  Means of arrival:  Comments: Darrell Sutton Sep 28, 2061- mets esophageal ca- liver function- bile duct obstruction?

## 2018-11-08 ENCOUNTER — Other Ambulatory Visit: Payer: Self-pay

## 2018-11-08 DIAGNOSIS — I2510 Atherosclerotic heart disease of native coronary artery without angina pectoris: Secondary | ICD-10-CM

## 2018-11-08 DIAGNOSIS — E119 Type 2 diabetes mellitus without complications: Secondary | ICD-10-CM

## 2018-11-08 DIAGNOSIS — C159 Malignant neoplasm of esophagus, unspecified: Secondary | ICD-10-CM

## 2018-11-08 DIAGNOSIS — Z9884 Bariatric surgery status: Secondary | ICD-10-CM

## 2018-11-08 DIAGNOSIS — C799 Secondary malignant neoplasm of unspecified site: Secondary | ICD-10-CM

## 2018-11-08 LAB — COMPREHENSIVE METABOLIC PANEL
ALBUMIN: 2.8 g/dL — AB (ref 3.5–5.0)
ALT: 373 U/L — ABNORMAL HIGH (ref 0–44)
AST: 171 U/L — ABNORMAL HIGH (ref 15–41)
Alkaline Phosphatase: 454 U/L — ABNORMAL HIGH (ref 38–126)
Anion gap: 10 (ref 5–15)
BILIRUBIN TOTAL: 7.7 mg/dL — AB (ref 0.3–1.2)
BUN: 12 mg/dL (ref 6–20)
CO2: 28 mmol/L (ref 22–32)
Calcium: 8.7 mg/dL — ABNORMAL LOW (ref 8.9–10.3)
Chloride: 101 mmol/L (ref 98–111)
Creatinine, Ser: 0.51 mg/dL — ABNORMAL LOW (ref 0.61–1.24)
GFR calc Af Amer: >60 mL/min (ref >60)
GFR calc non Af Amer: >60 mL/min (ref >60)
GLUCOSE: 252 mg/dL — AB (ref 70–99)
Potassium: 4 mmol/L (ref 3.5–5.1)
Sodium: 139 mmol/L (ref 135–145)
Total Protein: 6.4 g/dL — ABNORMAL LOW (ref 6.5–8.1)

## 2018-11-08 LAB — CBC
HCT: 45.3 % (ref 39.0–52.0)
Hemoglobin: 13.3 g/dL (ref 13.0–17.0)
MCH: 23.2 pg — ABNORMAL LOW (ref 26.0–34.0)
MCHC: 29.4 g/dL — ABNORMAL LOW (ref 30.0–36.0)
MCV: 78.9 fL — ABNORMAL LOW (ref 80.0–100.0)
Platelets: 270 10*3/uL (ref 150–400)
RBC: 5.74 MIL/uL (ref 4.22–5.81)
RDW: 19.3 % — ABNORMAL HIGH (ref 11.5–15.5)
WBC: 7.2 10*3/uL (ref 4.0–10.5)
nRBC: 0 % (ref 0.0–0.2)

## 2018-11-08 LAB — PROTIME-INR
INR: 1.16
Prothrombin Time: 14.7 seconds (ref 11.4–15.2)

## 2018-11-08 LAB — GLUCOSE, CAPILLARY: Glucose-Capillary: 126 mg/dL — ABNORMAL HIGH (ref 70–99)

## 2018-11-08 MED ORDER — SODIUM CHLORIDE 0.9% FLUSH
10.0000 mL | INTRAVENOUS | Status: DC | PRN
  Administered 2018-11-11: 10 mL
  Filled 2018-11-08: qty 40

## 2018-11-08 NOTE — Progress Notes (Signed)
Patient ID: Darrell Sutton, male   DOB: 06-06-1961, 57 y.o.   MRN: [id number]  PROGRESS NOTE    Darrell Sutton  QMG:[id number] DOB: Jul 08, 1961 DOA: 11/07/2018 PCP: [name], PA   Brief Narrative:   57 y.o. 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 [name] office with complaints of not feeling well for the last few days.  He was found to have abnormal LFTs, ultrasound of abdomen showed dilated common bile duct.  GI was consulted.  MRCP was ordered.   Assessment & Plan:   Principal Problem:   Abnormal LFTs Active Problems:   Obesity   Hypertension   Type 2 diabetes mellitus with complication, without long-term current use of insulin (HCC)   Cancer of lower third of esophagus (HCC)   Mechanical dysphagia from esophageal cancer   Jejunostomy tube present   Abnormal LFTs with elevated bilirubin  -MRCP shows CBD narrowing with concern for stricture/inflammation or cholangitis.  No evidence of fever or chills.  White count is normal.  Will not start antibiotics at this time until GI evaluation.  -GI evaluation is pending. -AST and ALT are improving, bilirubin is almost the same as yesterday.  Repeat a.m. LFTs -We will keep him n.p.o. till GI evaluation for possible ERCP.  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  Follow oncology recommendations  -Tube feedings will remain on hold until further evaluation by GI  Dehydration  -Decrease normal saline to 75 cc an hour  Obesity -Outpatient follow-up  Hypertension -Monitor blood pressure.  Continue clonidine patch  Diabetes mellitus type 2 uncontrolled with  hyperglycemia -Continue home regimen.  CBGs with SSI   DVT prophylaxis: Lovenox Code Status: DNR Family Communication: None at bedside Disposition Plan: Home in 1 to 2 days once cleared by GI Consultants: GI  Procedures: None  Antimicrobials: None   Subjective: Patient seen and examined at bedside.  He denies any overnight worsening fever, nausea, vomiting or chills.  He intermittently spits up his saliva.  Objective: Vitals:   11/07/18 1857 11/07/18 1900 11/07/18 2110 11/08/18 0544  BP: (!) 158/95 (!) 158/95 (!) 157/82 (!) 153/85  Pulse: 84 84 76 73  Resp: 18 18 18 18   Temp: 98.2 F (36.8 C) 98.2 F (36.8 C) 98.2 F (36.8 C) 97.6 F (36.4 C)  TempSrc:  Oral Oral Oral  SpO2: 98%  100% 100%  Weight:  130.4 kg    Height:  6' (1.829 m)      Intake/Output Summary (Last 24 hours) at 11/08/2018 1302 Last data filed at 11/08/2018 1000 Gross per 24 hour  Intake 1896.46 ml  Output 0 ml  Net 1896.46 ml   Filed Weights   11/07/18 1900  Weight: 130.4 kg    Examination:  General exam: Appears calm and comfortable  Respiratory system: Bilateral decreased breath sounds at bases Cardiovascular system: S1 & S2 heard, Rate controlled Gastrointestinal system: Abdomen is obese, nondistended, soft and nontender. Normal bowel sounds heard.  Jejunostomy tube in place Extremities: No cyanosis, clubbing, edema   Data Reviewed: I have personally reviewed following labs and imaging studies  CBC: Recent Labs  Lab 11/07/18 0923 11/07/18 1600 11/08/18 0500  WBC 8.0 7.4 7.2  NEUTROABS 7.1  --   --   HGB  14.1 13.4 13.3  HCT 46.5 46.3 45.3  MCV 76.5* 80.4 78.9*  PLT 285 273 494   Basic Metabolic Panel: Recent Labs  Lab 11/07/18 0923 11/07/18 1600 11/08/18 0500  NA 136 137 139  K 4.1 4.1 4.0  CL 97* 99 101  CO2 29 27 28   GLUCOSE 389* 326* 252*  BUN 12 14 12   CREATININE 0.85 0.55* 0.51*  CALCIUM 9.5 8.9 8.7*   GFR: Estimated Creatinine Clearance: 142.2 mL/min (A)  (by C-G formula based on SCr of 0.51 mg/dL (L)). Liver Function Tests: Recent Labs  Lab 11/07/18 0923 11/07/18 1600 11/08/18 0500  AST 235* 206* 171*  ALT 503* 443* 373*  ALKPHOS 616* 486* 454*  BILITOT 8.0* 7.6* 7.7*  PROT 7.3 7.0 6.4*  ALBUMIN 3.0* 3.2* 2.8*   Recent Labs  Lab 11/07/18 1600  LIPASE 26   No results for input(s): AMMONIA in the last 168 hours. Coagulation Profile: Recent Labs  Lab 11/07/18 1600 11/08/18 0500  INR 1.11 1.16   Cardiac Enzymes: No results for input(s): CKTOTAL, CKMB, CKMBINDEX, TROPONINI in the last 168 hours. BNP (last 3 results) No results for input(s): PROBNP in the last 8760 hours. HbA1C: No results for input(s): HGBA1C in the last 72 hours. CBG: Recent Labs  Lab 11/07/18 2144  GLUCAP 264*   Lipid Profile: No results for input(s): CHOL, HDL, LDLCALC, TRIG, CHOLHDL, LDLDIRECT in the last 72 hours. Thyroid Function Tests: No results for input(s): TSH, T4TOTAL, FREET4, T3FREE, THYROIDAB in the last 72 hours. Anemia Panel: No results for input(s): VITAMINB12, FOLATE, FERRITIN, TIBC, IRON, RETICCTPCT in the last 72 hours. Sepsis Labs: No results for input(s): PROCALCITON, LATICACIDVEN in the last 168 hours.  No results found for this or any previous visit (from the past 240 hour(s)).       Radiology Studies: US Abdomen Complete  Result Date: 11/07/2018 CLINICAL DATA:  Jaundice, rule out biliary obstruction EXAM: ABDOMEN ULTRASOUND COMPLETE COMPARISON:  CT 08/22/2018 FINDINGS: Gallbladder: Gallbladder is distended to 6 cm in diameter. There is sludge within the gallbladder. No echogenic gallstones. Negative sonographic Murphy's sign. No pericholecystic fluid. Significant gallbladder wall thickening. Common bile duct: Diameter: Dilated 9 mm. Liver: No focal lesion identified. Within normal limits in parenchymal echogenicity. Portal vein is patent on color Doppler imaging with normal direction of blood flow towards the liver. IVC:  No abnormality visualized. Pancreas: Visualized portion unremarkable. Spleen: Size and appearance within normal limits. Right Kidney: Length: 12.6 cm. Cystic lesion with thin septation is coming anechoic measures 2.3 cm. This corresponds to low-density cyst on comparison CT. Left Kidney: Length: 14.3 cm.  There is Abdominal aorta: No aneurysm visualized. Other findings: None. IMPRESSION: 1. Dilatation of the common bile duct and gallbladder without obstructing lesion identified. No gallstones are evident but there is sludge within the lumen gallbladder. Consider contrast MRCP to further evaluate etiology of potential distal biliary obstruction. 2. No intrahepatic biliary duct dilatation identified. Electronically Signed   By: Suzy Bouchard M.D.   On: 11/07/2018 14:52   Mr 3d Recon At Scanner  Result Date: 11/07/2018 CLINICAL DATA:  Jaundice. Elevated liver function tests. History of esophageal cancer. EXAM: MRI ABDOMEN WITHOUT AND WITH CONTRAST (INCLUDING MRCP) TECHNIQUE: Multiplanar multisequence MR imaging of the abdomen was performed both before and after the administration of intravenous contrast. Heavily T2-weighted images of the biliary and pancreatic ducts were obtained, and three-dimensional MRCP images were rendered by post processing. CONTRAST:  10 cc Gadavist COMPARISON:  Multiple exams, including ultrasound 11/07/2018  and PET-CT from 09/29/2018 FINDINGS: Lower chest: Distal esophageal wall thickening with surrounding edema noted in this patient with history of esophageal mass. There is scarring or atelectasis in the right lower lobe. Hepatobiliary: Sludge in the gallbladder. Mild intrahepatic biliary dilatation with abrupt tapered/beaked appearance of the common bile duct approximately corresponding to its course along the pancreatic head. No well-defined filling defect. The common hepatic duct measures about 8 mm in diameter proximal to this area of narrowing. In the area of narrowing there is  subtly accentuated enhancement in the expected vicinity of the dorsal pancreatic duct. The appearance is not classic for pancreatic malignancy, there is no associated hypoenhancement in this region. Pancreas: No discrete pancreatic mass is identified. As noted above, the narrowed segment of the CBD in the vicinity of the pancreatic head may potentially have faintly associated accentuated enhancement. Spleen:  Unremarkable Adrenals/Urinary Tract: Right mid kidney Bosniak category 2 cyst anteriorly. Otherwise unremarkable. Adrenal glands normal. Stomach/Bowel: There is wall thickening in the gastric cardia with surrounding stranding and indistinctness of tissue planes. Jejunostomy tube noted. Vascular/Lymphatic: A porta hepatis node measures 1.6 cm in short axis on image 48/1102. Another porta hepatis node measures 1.2 cm in short axis on image 45/1102. Smaller periaortic lymph nodes are present and there are small lymph nodes around the distal esophagus and gastric cardia. Other:  No supplemental non-categorized findings. Musculoskeletal: Unremarkable IMPRESSION: 1. Marked narrowing in a 2.3 cm segment of the common bile duct corresponding with the segment of the duct along the pancreas. I do not see a definite pancreatic lesion but there is some faintly accentuated enhancement in the CBD in this vicinity. This could be from localized inflammation/cholangitis or a stricture. The CBD proximal to this stricture is 0.8 cm in diameter. Sludge noted in the gallbladder without definite stones. 2. Distal esophageal wall thickening and thickening in the proximal stomach. There are also postoperative findings in the stomach. Surrounding inflammatory stranding is noted around the distal esophagus and proximal stomach, possibly therapy related. Surrounding small lymph nodes noted along with enlarged lymph nodes in the porta hepatis which are nonspecific and could be reactive or malignant. 3. Scarring or atelectasis in the right  lower lobe. Electronically Signed   By: Van Clines M.D.   On: 11/07/2018 19:06   Mr Abdomen Mrcp Moise Boring Contast  Result Date: 11/07/2018 CLINICAL DATA:  Jaundice. Elevated liver function tests. History of esophageal cancer. EXAM: MRI ABDOMEN WITHOUT AND WITH CONTRAST (INCLUDING MRCP) TECHNIQUE: Multiplanar multisequence MR imaging of the abdomen was performed both before and after the administration of intravenous contrast. Heavily T2-weighted images of the biliary and pancreatic ducts were obtained, and three-dimensional MRCP images were rendered by post processing. CONTRAST:  10 cc Gadavist COMPARISON:  Multiple exams, including ultrasound 11/07/2018 and PET-CT from 09/29/2018 FINDINGS: Lower chest: Distal esophageal wall thickening with surrounding edema noted in this patient with history of esophageal mass. There is scarring or atelectasis in the right lower lobe. Hepatobiliary: Sludge in the gallbladder. Mild intrahepatic biliary dilatation with abrupt tapered/beaked appearance of the common bile duct approximately corresponding to its course along the pancreatic head. No well-defined filling defect. The common hepatic duct measures about 8 mm in diameter proximal to this area of narrowing. In the area of narrowing there is subtly accentuated enhancement in the expected vicinity of the dorsal pancreatic duct. The appearance is not classic for pancreatic malignancy, there is no associated hypoenhancement in this region. Pancreas: No discrete pancreatic mass is identified. As noted  above, the narrowed segment of the CBD in the vicinity of the pancreatic head may potentially have faintly associated accentuated enhancement. Spleen:  Unremarkable Adrenals/Urinary Tract: Right mid kidney Bosniak category 2 cyst anteriorly. Otherwise unremarkable. Adrenal glands normal. Stomach/Bowel: There is wall thickening in the gastric cardia with surrounding stranding and indistinctness of tissue planes. Jejunostomy  tube noted. Vascular/Lymphatic: A porta hepatis node measures 1.6 cm in short axis on image 48/1102. Another porta hepatis node measures 1.2 cm in short axis on image 45/1102. Smaller periaortic lymph nodes are present and there are small lymph nodes around the distal esophagus and gastric cardia. Other:  No supplemental non-categorized findings. Musculoskeletal: Unremarkable IMPRESSION: 1. Marked narrowing in a 2.3 cm segment of the common bile duct corresponding with the segment of the duct along the pancreas. I do not see a definite pancreatic lesion but there is some faintly accentuated enhancement in the CBD in this vicinity. This could be from localized inflammation/cholangitis or a stricture. The CBD proximal to this stricture is 0.8 cm in diameter. Sludge noted in the gallbladder without definite stones. 2. Distal esophageal wall thickening and thickening in the proximal stomach. There are also postoperative findings in the stomach. Surrounding inflammatory stranding is noted around the distal esophagus and proximal stomach, possibly therapy related. Surrounding small lymph nodes noted along with enlarged lymph nodes in the porta hepatis which are nonspecific and could be reactive or malignant. 3. Scarring or atelectasis in the right lower lobe. Electronically Signed   By: Van Clines M.D.   On: 11/07/2018 19:06        Scheduled Meds: . [START ON 11/14/2018] cloNIDine  0.1 mg Transdermal Weekly  . enoxaparin (LOVENOX) injection  60 mg Subcutaneous Q24H  . insulin aspart  0-20 Units Subcutaneous TID WC  . insulin aspart  0-5 Units Subcutaneous QHS  . insulin aspart protamine- aspart  7 Units Subcutaneous BID WC  . [START ON 11/10/2018] scopolamine  1 patch Transdermal Q72H   Continuous Infusions: . sodium chloride 100 mL/hr at 11/08/18 1000     LOS: 0 days        Aline August, MD [facility] Pager [phone]  If 7PM-7AM, please contact  night-coverage [email or web address] Password [email or web address] 11/08/2018, 1:02 PM

## 2018-11-08 NOTE — Consult Note (Signed)
CROSS COVER [facility]  Reason for Consult: Jaundice/Abnormal MRI. Referring Physician: THP  KOUPER SPINELLA is an 57 y.o. male.  HPI: Mr. 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 History with Dr. Annamaria Boots in the office when she noticed that he is to be jaundiced and therefore he was sent to the ED for further evaluation.Marland Kitchen He was found to have abnormal LFTs and abdominal ultrasound showed dilated bile ducts. In the process of his workup an MRCP was done that showed CBD narrowing with concern for stricture versus inflammation or cholangitis patient denies having any fever chills or rigors. He gives a history of being nauseated but denies having any vomiting. He's had some mild constipation signature narcotic use. He is on PPIs for reflux.  Past Medical History:  Diagnosis Date  . Anxiety 2009   started with divorce  . Cancer [facility])    cancer of lower third of esophagus  . Diabetes mellitus without complication ([place])   . Gastric ulcer    from NSAID overuse  . GERD (gastroesophageal reflux disease)   . Hyperlipidemia   . Hypertension   . MI (myocardial infarction) ([place]) 2011   has two stents  . Obesity    highest weight in 500's   Past Surgical History:  Procedure Laterality Date  . [place] SURGERY  2010  . core biopsy  10/11/2018   core biopsy of lymph nodes in Interventional Radiology  . GASTRIC BYPASS  11/24/2008  . GASTROJEJUNOSTOMY N/A 10/17/2018   Procedure: LAPAROSCOPIC PLACEMENT OF  LEFT FEEDING JEJUNOSTOMY TUBE;  Surgeon: Michael Boston, MD;  Location: [facility];  Service: General;  Laterality: N/A;  . JOINT REPLACEMENT     Bilateral knee  . PORTACATH PLACEMENT Right 10/17/2018   Procedure: INSERTION  PORT-A-CATH WITH ULTRASOUND AND FLUORO;  Surgeon: Michael Boston, MD;  Location: [facility];  Service: General;  Laterality: Right;  . TONSILECTOMY/ADENOIDECTOMY WITH MYRINGOTOMY     Family History  Problem Relation Age of Onset  . Hyperlipidemia Mother   . Hypertension Mother   . Hyperlipidemia Father   . Hypertension Father   . Stroke Father   . Cancer Father        NHL  . Diabetes Brother   . Heart attack Brother 75  . Hyperlipidemia Brother   . Hypertension Brother   . Heart attack Maternal Grandmother   . Heart attack Maternal Grandfather   . Early death Maternal Grandfather   . Esophageal cancer Paternal Grandmother   . Colon cancer Paternal Grandfather   . Colon cancer Cousin   . Stomach cancer Neg Hx    Social History:  reports that he has never smoked. He has never used smokeless tobacco. He reports previous drug use. Drug: Marijuana. He reports that he does not drink alcohol.  Allergies:  Allergies  Allergen Reactions  . Penicillins Hives    Has patient had a PCN reaction causing immediate rash, facial/tongue/throat swelling, SOB or lightheadedness with hypotension: Yes Has patient had a PCN reaction causing severe rash involving mucus membranes or skin necrosis: No Has patient had a PCN reaction that required hospitalization: No; was already in hosp Has patient had a PCN reaction occurring within the last 10 years: No If all of the above answers are "NO", then  may proceed with Cephalosporin use.    Medications: I have reviewed the patient's current medications.  Results for orders placed or performed during the hospital encounter of 11/07/18 (from the past 48 hour(s))  Protime-INR     Status: None   Collection Time: 11/07/18  4:00 PM  Result Value Ref Range   Prothrombin Time 14.2 11.4 - 15.2 seconds   INR 1.11     Comment: Performed at [facility], [place] [address], [place], [place] [address]  CBC     Status: Abnormal   Collection Time: 11/07/18   4:00 PM  Result Value Ref Range   WBC 7.4 4.0 - 10.5 K/uL   RBC 5.76 4.22 - 5.81 MIL/uL   Hemoglobin 13.4 13.0 - 17.0 g/dL   HCT 46.3 39.0 - 52.0 %   MCV 80.4 80.0 - 100.0 fL   MCH 23.3 (L) 26.0 - 34.0 pg   MCHC 28.9 (L) 30.0 - 36.0 g/dL   RDW 19.0 (H) 11.5 - 15.5 %   Platelets 273 150 - 400 K/uL   nRBC 0.0 0.0 - 0.2 %    Comment: Performed at [facility], [place] [address] [place], [place] [address]  Comprehensive metabolic panel     Status: Abnormal   Collection Time: 11/07/18  4:00 PM  Result Value Ref Range   Sodium 137 135 - 145 mmol/L   Potassium 4.1 3.5 - 5.1 mmol/L   Chloride 99 98 - 111 mmol/L   CO2 27 22 - 32 mmol/L   Glucose, Bld 326 (H) 70 - 99 mg/dL   BUN 14 6 - 20 mg/dL   Creatinine, Ser 0.55 (L) 0.61 - 1.24 mg/dL   Calcium 8.9 8.9 - 10.3 mg/dL   Total Protein 7.0 6.5 - 8.1 g/dL   Albumin 3.2 (L) 3.5 - 5.0 g/dL   AST 206 (H) 15 - 41 U/L   ALT 443 (H) 0 - 44 U/L   Alkaline Phosphatase 486 (H) 38 - 126 U/L   Total Bilirubin 7.6 (H) 0.3 - 1.2 mg/dL   GFR calc non Af Amer >60 >60 mL/min   GFR calc Af Amer >60 >60 mL/min   Anion gap 11 5 - 15    Comment: Performed at [facility], [place] [address] [place], [place] [address]  Lipase, blood     Status: None   Collection Time: 11/07/18  4:00 PM  Result Value Ref Range   Lipase 26 11 - 51 U/L    Comment: Performed at [facility], [place] [address], [place], [place] [address]  Glucose, capillary     Status: Abnormal   Collection Time: 11/07/18  9:44 PM  Result Value Ref Range   Glucose-Capillary 264 (H) 70 - 99 mg/dL  Comprehensive metabolic panel     Status: Abnormal   Collection Time: 11/08/18  5:00 AM  Result Value Ref Range   Sodium 139 135 - 145 mmol/L   Potassium 4.0 3.5 - 5.1 mmol/L   Chloride 101 98 - 111 mmol/L   CO2 28 22 - 32 mmol/L   Glucose, Bld 252 (H) 70 - 99 mg/dL   BUN 12 6 - 20 mg/dL   Creatinine, Ser 0.51 (L) 0.61 - 1.24 mg/dL   Calcium  8.7 (L) 8.9 - 10.3 mg/dL   Total Protein 6.4 (L) 6.5 - 8.1 g/dL   Albumin 2.8 (L) 3.5 - 5.0 g/dL   AST 171 (H) 15 - 41 U/L  ALT 373 (H) 0 - 44 U/L   Alkaline Phosphatase 454 (H) 38 - 126 U/L   Total Bilirubin 7.7 (H) 0.3 - 1.2 mg/dL   GFR calc non Af Amer >60 >60 mL/min   GFR calc Af Amer >60 >60 mL/min   Anion gap 10 5 - 15    Comment: Performed at [facility], [place] [address] [place], [place] [address]  CBC     Status: Abnormal   Collection Time: 11/08/18  5:00 AM  Result Value Ref Range   WBC 7.2 4.0 - 10.5 K/uL   RBC 5.74 4.22 - 5.81 MIL/uL   Hemoglobin 13.3 13.0 - 17.0 g/dL   HCT 45.3 39.0 - 52.0 %   MCV 78.9 (L) 80.0 - 100.0 fL   MCH 23.2 (L) 26.0 - 34.0 pg   MCHC 29.4 (L) 30.0 - 36.0 g/dL   RDW 19.3 (H) 11.5 - 15.5 %   Platelets 270 150 - 400 K/uL   nRBC 0.0 0.0 - 0.2 %    Comment: Performed at [facility], [place] [address] [place], [place] [address]  Protime-INR     Status: None   Collection Time: 11/08/18  5:00 AM  Result Value Ref Range   Prothrombin Time 14.7 11.4 - 15.2 seconds   INR 1.16     Comment: Performed at [facility], [place] [address] [place], [place] [address]   US Abdomen Complete  Result Date: 11/07/2018 CLINICAL DATA:  Jaundice, rule out biliary obstruction EXAM: ABDOMEN ULTRASOUND COMPLETE COMPARISON:  CT 08/22/2018 FINDINGS: Gallbladder: Gallbladder is distended to 6 cm in diameter. There is sludge within the gallbladder. No echogenic gallstones. Negative sonographic Murphy's sign. No pericholecystic fluid. Significant gallbladder wall thickening. Common bile duct: Diameter: Dilated 9 mm. Liver: No focal lesion identified. Within normal limits in parenchymal echogenicity. Portal vein is patent on color Doppler imaging with normal direction of blood flow towards the liver. IVC: No abnormality visualized. Pancreas: Visualized portion unremarkable. Spleen: Size and appearance within normal limits.  Right Kidney: Length: 12.6 cm. Cystic lesion with thin septation is coming anechoic measures 2.3 cm. This corresponds to low-density cyst on comparison CT. Left Kidney: Length: 14.3 cm.  There is Abdominal aorta: No aneurysm visualized. Other findings: None. IMPRESSION: 1. Dilatation of the common bile duct and gallbladder without obstructing lesion identified. No gallstones are evident but there is sludge within the lumen gallbladder. Consider contrast MRCP to further evaluate etiology of potential distal biliary obstruction. 2. No intrahepatic biliary duct dilatation identified. Electronically Signed   By: Suzy Bouchard M.D.   On: 11/07/2018 14:52   Mr 3d Recon At Scanner  Result Date: 11/07/2018 CLINICAL DATA:  Jaundice. Elevated liver function tests. History of esophageal cancer. EXAM: MRI ABDOMEN WITHOUT AND WITH CONTRAST (INCLUDING MRCP) TECHNIQUE: Multiplanar multisequence MR imaging of the abdomen was performed both before and after the administration of intravenous contrast. Heavily T2-weighted images of the biliary and pancreatic ducts were obtained, and three-dimensional MRCP images were rendered by post processing. CONTRAST:  10 cc Gadavist COMPARISON:  Multiple exams, including ultrasound 11/07/2018 and PET-CT from 09/29/2018 FINDINGS: Lower chest: Distal esophageal wall thickening with surrounding edema noted in this patient with history of esophageal mass. There is scarring or atelectasis in the right lower lobe. Hepatobiliary: Sludge in the gallbladder. Mild intrahepatic biliary dilatation with abrupt tapered/beaked appearance of the common bile duct approximately corresponding to its course along the pancreatic head. No well-defined filling defect. The common hepatic  duct measures about 8 mm in diameter proximal to this area of narrowing. In the area of narrowing there is subtly accentuated enhancement in the expected vicinity of the dorsal pancreatic duct. The appearance is not classic for  pancreatic malignancy, there is no associated hypoenhancement in this region. Pancreas: No discrete pancreatic mass is identified. As noted above, the narrowed segment of the CBD in the vicinity of the pancreatic head may potentially have faintly associated accentuated enhancement. Spleen:  Unremarkable Adrenals/Urinary Tract: Right mid kidney Bosniak category 2 cyst anteriorly. Otherwise unremarkable. Adrenal glands normal. Stomach/Bowel: There is wall thickening in the gastric cardia with surrounding stranding and indistinctness of tissue planes. Jejunostomy tube noted. Vascular/Lymphatic: A porta hepatis node measures 1.6 cm in short axis on image 48/1102. Another porta hepatis node measures 1.2 cm in short axis on image 45/1102. Smaller periaortic lymph nodes are present and there are small lymph nodes around the distal esophagus and gastric cardia. Other:  No supplemental non-categorized findings. Musculoskeletal: Unremarkable IMPRESSION: 1. Marked narrowing in a 2.3 cm segment of the common bile duct corresponding with the segment of the duct along the pancreas. I do not see a definite pancreatic lesion but there is some faintly accentuated enhancement in the CBD in this vicinity. This could be from localized inflammation/cholangitis or a stricture. The CBD proximal to this stricture is 0.8 cm in diameter. Sludge noted in the gallbladder without definite stones. 2. Distal esophageal wall thickening and thickening in the proximal stomach. There are also postoperative findings in the stomach. Surrounding inflammatory stranding is noted around the distal esophagus and proximal stomach, possibly therapy related. Surrounding small lymph nodes noted along with enlarged lymph nodes in the porta hepatis which are nonspecific and could be reactive or malignant. 3. Scarring or atelectasis in the right lower lobe. Electronically Signed   By: Van Clines M.D.   On: 11/07/2018 19:06   Mr Abdomen Mrcp Moise Boring  Contast  Result Date: 11/07/2018 CLINICAL DATA:  Jaundice. Elevated liver function tests. History of esophageal cancer. EXAM: MRI ABDOMEN WITHOUT AND WITH CONTRAST (INCLUDING MRCP) TECHNIQUE: Multiplanar multisequence MR imaging of the abdomen was performed both before and after the administration of intravenous contrast. Heavily T2-weighted images of the biliary and pancreatic ducts were obtained, and three-dimensional MRCP images were rendered by post processing. CONTRAST:  10 cc Gadavist COMPARISON:  Multiple exams, including ultrasound 11/07/2018 and PET-CT from 09/29/2018 FINDINGS: Lower chest: Distal esophageal wall thickening with surrounding edema noted in this patient with history of esophageal mass. There is scarring or atelectasis in the right lower lobe. Hepatobiliary: Sludge in the gallbladder. Mild intrahepatic biliary dilatation with abrupt tapered/beaked appearance of the common bile duct approximately corresponding to its course along the pancreatic head. No well-defined filling defect. The common hepatic duct measures about 8 mm in diameter proximal to this area of narrowing. In the area of narrowing there is subtly accentuated enhancement in the expected vicinity of the dorsal pancreatic duct. The appearance is not classic for pancreatic malignancy, there is no associated hypoenhancement in this region. Pancreas: No discrete pancreatic mass is identified. As noted above, the narrowed segment of the CBD in the vicinity of the pancreatic head may potentially have faintly associated accentuated enhancement. Spleen:  Unremarkable Adrenals/Urinary Tract: Right mid kidney Bosniak category 2 cyst anteriorly. Otherwise unremarkable. Adrenal glands normal. Stomach/Bowel: There is wall thickening in the gastric cardia with surrounding stranding and indistinctness of tissue planes. Jejunostomy tube noted. Vascular/Lymphatic: A porta hepatis node measures 1.6 cm  in short axis on image 48/1102. Another  porta hepatis node measures 1.2 cm in short axis on image 45/1102. Smaller periaortic lymph nodes are present and there are small lymph nodes around the distal esophagus and gastric cardia. Other:  No supplemental non-categorized findings. Musculoskeletal: Unremarkable IMPRESSION: 1. Marked narrowing in a 2.3 cm segment of the common bile duct corresponding with the segment of the duct along the pancreas. I do not see a definite pancreatic lesion but there is some faintly accentuated enhancement in the CBD in this vicinity. This could be from localized inflammation/cholangitis or a stricture. The CBD proximal to this stricture is 0.8 cm in diameter. Sludge noted in the gallbladder without definite stones. 2. Distal esophageal wall thickening and thickening in the proximal stomach. There are also postoperative findings in the stomach. Surrounding inflammatory stranding is noted around the distal esophagus and proximal stomach, possibly therapy related. Surrounding small lymph nodes noted along with enlarged lymph nodes in the porta hepatis which are nonspecific and could be reactive or malignant. 3. Scarring or atelectasis in the right lower lobe. Electronically Signed   By: Van Clines M.D.   On: 11/07/2018 19:06   Review of Systems  Constitutional: Negative.   HENT: Negative.   Eyes: Negative.   Respiratory: Negative.   Cardiovascular: Negative.   Gastrointestinal: Positive for nausea. Negative for abdominal pain, blood in stool, constipation, diarrhea, heartburn, melena and vomiting.  Genitourinary: Negative.   Skin: Negative.   Neurological: Negative.   Endo/Heme/Allergies: Negative.   Psychiatric/Behavioral: Negative.    Blood pressure (!) 153/85, pulse 73, temperature 97.6 F (36.4 C), temperature source Oral, resp. rate 18, height 6' (1.829 m), weight 130.4 kg, SpO2 100 %. Physical Exam  Constitutional: He is oriented to person, place, and time. He appears well-developed and  well-nourished.  HENT:  Head: Normocephalic and atraumatic.  Eyes: Pupils are equal, round, and reactive to light. Conjunctivae and EOM are normal.  Neck: Normal range of motion. Neck supple.  Cardiovascular: Normal rate and regular rhythm.  Respiratory: Effort normal and breath sounds normal.  GI: Soft. Bowel sounds are normal.  Musculoskeletal: Normal range of motion.  Neurological: He is alert and oriented to person, place, and time.  Skin: Skin is warm and dry.  Psychiatric: He has a normal mood and affect. His behavior is normal. Judgment and thought content normal.   Assessment/Plan: 1) Metastatic esophageal K adenocarcinoma with feeding jejunostomy in place; MRCP revealed sludge in the gallbladder with mild intrahepatic biliary dilatation with abrupt tapered/beat appearance of the common bile duct approximately corresponding to its course along the pancreatic head without any well-defined defect in the pancreas the common hepatic duct measured 8 mm in diameter diabetes proximal to this area of narrowing in the area of narrowing the subtly accentuated enhancement in the expected vicinity of the dorsal pancreatic duct. ) Findings and endoscopic ultrasound has been requested as per my discussion with Dr. Annamaria Boots.  Dr. 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 3) GERD : On PPI.'s. 4) Hypertension. 5) Type II AODM/Morbid obesity. Timberlynn Kizziah 11/08/2018, 1:03 PM

## 2018-11-08 NOTE — Progress Notes (Signed)
TAOS TAPP   DOB:06-26-1961   YK#:[id number]   NLZ#:[id number]  Oncology f/u   Subjective: Patient is well-known to me, under my care for his metastatic esophageal cancer.  He is currently on palliative radiation, and was admitted from my hospital yesterday for newly onset hyperbilirubinemia.  He is clinically stable, afebrile, denies any abdominal pain.  His dysphagia and odynophagia remains same.   Objective:  Vitals:   11/08/18 0544 11/08/18 1354  BP: (!) 153/85 (!) 163/90  Pulse: 73 81  Resp: 18 16  Temp: 97.6 F (36.4 C)   SpO2: 100% 100%    Body mass index is 38.98 kg/m.  Intake/Output Summary (Last 24 hours) at 11/08/2018 1359 Last data filed at 11/08/2018 1000 Gross per 24 hour  Intake 1896.46 ml  Output 0 ml  Net 1896.46 ml     Sclerae icteric  Oropharynx clear  No peripheral adenopathy  Lungs clear -- no rales or rhonchi  Heart regular rate and rhythm  Abdomen benign  MSK no focal spinal tenderness, no peripheral edema  Neuro nonfocal    CBG (last 3)  Recent Labs    11/07/18 2144  GLUCAP 264*     Labs:  Lab Results  Component Value Date   WBC 7.2 11/08/2018   HGB 13.3 11/08/2018   HCT 45.3 11/08/2018   MCV 78.9 (L) 11/08/2018   PLT 270 11/08/2018   NEUTROABS 7.1 11/07/2018    Urine Studies No results for input(s): UHGB, CRYS in the last 72 hours.  Invalid input(s): UACOL, UAPR, USPG, UPH, UTP, UGL, UKET, UBIL, UNIT, UROB, ULEU, UEPI, UWBC, URBC, UBAC, CAST, UCOM, [place]  Basic Metabolic Panel: Recent Labs  Lab 11/07/18 0923 11/07/18 1600 11/08/18 0500  NA 136 137 139  K 4.1 4.1 4.0  CL 97* 99 101  CO2 29 27 28   GLUCOSE 389* 326* 252*  BUN 12 14 12   CREATININE 0.85 0.55* 0.51*  CALCIUM 9.5 8.9 8.7*   GFR Estimated Creatinine Clearance: 142.2 mL/min (A) (by C-G formula based on SCr of 0.51 mg/dL (L)). Liver Function Tests: Recent Labs  Lab 11/07/18 0923 11/07/18 1600 11/08/18 0500  AST 235* 206* 171*  ALT 503* 443* 373*   ALKPHOS 616* 486* 454*  BILITOT 8.0* 7.6* 7.7*  PROT 7.3 7.0 6.4*  ALBUMIN 3.0* 3.2* 2.8*   Recent Labs  Lab 11/07/18 1600  LIPASE 26   No results for input(s): AMMONIA in the last 168 hours. Coagulation profile Recent Labs  Lab 11/07/18 1600 11/08/18 0500  INR 1.11 1.16    CBC: Recent Labs  Lab 11/07/18 0923 11/07/18 1600 11/08/18 0500  WBC 8.0 7.4 7.2  NEUTROABS 7.1  --   --   HGB 14.1 13.4 13.3  HCT 46.5 46.3 45.3  MCV 76.5* 80.4 78.9*  PLT 285 273 270   Cardiac Enzymes: No results for input(s): CKTOTAL, CKMB, CKMBINDEX, TROPONINI in the last 168 hours. BNP: Invalid input(s): POCBNP CBG: Recent Labs  Lab 11/07/18 2144  GLUCAP 264*   D-Dimer No results for input(s): DDIMER in the last 72 hours. Hgb A1c No results for input(s): HGBA1C in the last 72 hours. Lipid Profile No results for input(s): CHOL, HDL, LDLCALC, TRIG, CHOLHDL, LDLDIRECT in the last 72 hours. Thyroid function studies No results for input(s): TSH, T4TOTAL, T3FREE, THYROIDAB in the last 72 hours.  Invalid input(s): FREET3 Anemia work up No results for input(s): VITAMINB12, FOLATE, FERRITIN, TIBC, IRON, RETICCTPCT in the last 72 hours. Microbiology No results found for this  or any previous visit (from the past 240 hour(s)).    Studies:  US Abdomen Complete  Result Date: 11/07/2018 CLINICAL DATA:  Jaundice, rule out biliary obstruction EXAM: ABDOMEN ULTRASOUND COMPLETE COMPARISON:  CT 08/22/2018 FINDINGS: Gallbladder: Gallbladder is distended to 6 cm in diameter. There is sludge within the gallbladder. No echogenic gallstones. Negative sonographic Murphy's sign. No pericholecystic fluid. Significant gallbladder wall thickening. Common bile duct: Diameter: Dilated 9 mm. Liver: No focal lesion identified. Within normal limits in parenchymal echogenicity. Portal vein is patent on color Doppler imaging with normal direction of blood flow towards the liver. IVC: No abnormality visualized.  Pancreas: Visualized portion unremarkable. Spleen: Size and appearance within normal limits. Right Kidney: Length: 12.6 cm. Cystic lesion with thin septation is coming anechoic measures 2.3 cm. This corresponds to low-density cyst on comparison CT. Left Kidney: Length: 14.3 cm.  There is Abdominal aorta: No aneurysm visualized. Other findings: None. IMPRESSION: 1. Dilatation of the common bile duct and gallbladder without obstructing lesion identified. No gallstones are evident but there is sludge within the lumen gallbladder. Consider contrast MRCP to further evaluate etiology of potential distal biliary obstruction. 2. No intrahepatic biliary duct dilatation identified. Electronically Signed   By: Suzy Bouchard M.D.   On: 11/07/2018 14:52   Mr 3d Recon At Scanner  Result Date: 11/07/2018 CLINICAL DATA:  Jaundice. Elevated liver function tests. History of esophageal cancer. EXAM: MRI ABDOMEN WITHOUT AND WITH CONTRAST (INCLUDING MRCP) TECHNIQUE: Multiplanar multisequence MR imaging of the abdomen was performed both before and after the administration of intravenous contrast. Heavily T2-weighted images of the biliary and pancreatic ducts were obtained, and three-dimensional MRCP images were rendered by post processing. CONTRAST:  10 cc Gadavist COMPARISON:  Multiple exams, including ultrasound 11/07/2018 and PET-CT from 09/29/2018 FINDINGS: Lower chest: Distal esophageal wall thickening with surrounding edema noted in this patient with history of esophageal mass. There is scarring or atelectasis in the right lower lobe. Hepatobiliary: Sludge in the gallbladder. Mild intrahepatic biliary dilatation with abrupt tapered/beaked appearance of the common bile duct approximately corresponding to its course along the pancreatic head. No well-defined filling defect. The common hepatic duct measures about 8 mm in diameter proximal to this area of narrowing. In the area of narrowing there is subtly accentuated  enhancement in the expected vicinity of the dorsal pancreatic duct. The appearance is not classic for pancreatic malignancy, there is no associated hypoenhancement in this region. Pancreas: No discrete pancreatic mass is identified. As noted above, the narrowed segment of the CBD in the vicinity of the pancreatic head may potentially have faintly associated accentuated enhancement. Spleen:  Unremarkable Adrenals/Urinary Tract: Right mid kidney Bosniak category 2 cyst anteriorly. Otherwise unremarkable. Adrenal glands normal. Stomach/Bowel: There is wall thickening in the gastric cardia with surrounding stranding and indistinctness of tissue planes. Jejunostomy tube noted. Vascular/Lymphatic: A porta hepatis node measures 1.6 cm in short axis on image 48/1102. Another porta hepatis node measures 1.2 cm in short axis on image 45/1102. Smaller periaortic lymph nodes are present and there are small lymph nodes around the distal esophagus and gastric cardia. Other:  No supplemental non-categorized findings. Musculoskeletal: Unremarkable IMPRESSION: 1. Marked narrowing in a 2.3 cm segment of the common bile duct corresponding with the segment of the duct along the pancreas. I do not see a definite pancreatic lesion but there is some faintly accentuated enhancement in the CBD in this vicinity. This could be from localized inflammation/cholangitis or a stricture. The CBD proximal to this stricture is 0.8  cm in diameter. Sludge noted in the gallbladder without definite stones. 2. Distal esophageal wall thickening and thickening in the proximal stomach. There are also postoperative findings in the stomach. Surrounding inflammatory stranding is noted around the distal esophagus and proximal stomach, possibly therapy related. Surrounding small lymph nodes noted along with enlarged lymph nodes in the porta hepatis which are nonspecific and could be reactive or malignant. 3. Scarring or atelectasis in the right lower lobe.  Electronically Signed   By: Van Clines M.D.   On: 11/07/2018 19:06   Mr Abdomen Mrcp Moise Boring Contast  Result Date: 11/07/2018 CLINICAL DATA:  Jaundice. Elevated liver function tests. History of esophageal cancer. EXAM: MRI ABDOMEN WITHOUT AND WITH CONTRAST (INCLUDING MRCP) TECHNIQUE: Multiplanar multisequence MR imaging of the abdomen was performed both before and after the administration of intravenous contrast. Heavily T2-weighted images of the biliary and pancreatic ducts were obtained, and three-dimensional MRCP images were rendered by post processing. CONTRAST:  10 cc Gadavist COMPARISON:  Multiple exams, including ultrasound 11/07/2018 and PET-CT from 09/29/2018 FINDINGS: Lower chest: Distal esophageal wall thickening with surrounding edema noted in this patient with history of esophageal mass. There is scarring or atelectasis in the right lower lobe. Hepatobiliary: Sludge in the gallbladder. Mild intrahepatic biliary dilatation with abrupt tapered/beaked appearance of the common bile duct approximately corresponding to its course along the pancreatic head. No well-defined filling defect. The common hepatic duct measures about 8 mm in diameter proximal to this area of narrowing. In the area of narrowing there is subtly accentuated enhancement in the expected vicinity of the dorsal pancreatic duct. The appearance is not classic for pancreatic malignancy, there is no associated hypoenhancement in this region. Pancreas: No discrete pancreatic mass is identified. As noted above, the narrowed segment of the CBD in the vicinity of the pancreatic head may potentially have faintly associated accentuated enhancement. Spleen:  Unremarkable Adrenals/Urinary Tract: Right mid kidney Bosniak category 2 cyst anteriorly. Otherwise unremarkable. Adrenal glands normal. Stomach/Bowel: There is wall thickening in the gastric cardia with surrounding stranding and indistinctness of tissue planes. Jejunostomy tube noted.  Vascular/Lymphatic: A porta hepatis node measures 1.6 cm in short axis on image 48/1102. Another porta hepatis node measures 1.2 cm in short axis on image 45/1102. Smaller periaortic lymph nodes are present and there are small lymph nodes around the distal esophagus and gastric cardia. Other:  No supplemental non-categorized findings. Musculoskeletal: Unremarkable IMPRESSION: 1. Marked narrowing in a 2.3 cm segment of the common bile duct corresponding with the segment of the duct along the pancreas. I do not see a definite pancreatic lesion but there is some faintly accentuated enhancement in the CBD in this vicinity. This could be from localized inflammation/cholangitis or a stricture. The CBD proximal to this stricture is 0.8 cm in diameter. Sludge noted in the gallbladder without definite stones. 2. Distal esophageal wall thickening and thickening in the proximal stomach. There are also postoperative findings in the stomach. Surrounding inflammatory stranding is noted around the distal esophagus and proximal stomach, possibly therapy related. Surrounding small lymph nodes noted along with enlarged lymph nodes in the porta hepatis which are nonspecific and could be reactive or malignant. 3. Scarring or atelectasis in the right lower lobe. Electronically Signed   By: Van Clines M.D.   On: 11/07/2018 19:06    Assessment: 57 y.o. with recently diagnosed metastatic esophageal adenocarcinoma in November 2019, currently on palliative radiation, status post jejunostomy tube placement, history of gastric bypass surgery, diabetes, hypertension, CAD, who  was admitted from my office for newly onset obstructive jaundice  1.  Obstructive jaundice, unclear etiology 2.  Metastatic esophageal adenocarcinoma, plan to finish palliative radiation tomorrow 3.  Dysphagia and odynophagia, secondary to #2 and radiation, S/P jejunostomy tube placement for tube feeds  4. DM 5. HTN  6. CAD   Plan:  -I have reviewed  the MRI/MRCP from yesterday and discussed with patient.  He has obstruction in the distal CBD, etiology is unclear, benign versus malignant lymph nodes compression  -he would need MRCP, which maybe challenge due to his previous gastric bypass surgery.  Dr. Collene Mares is going to see him today, I have sent a message to Dr. Ardis Hughs to see if he is available for the procedure  -continue tube feeds and supportive care  -I will f/u   Truitt Merle, MD 11/08/2018  1:59 PM

## 2018-11-09 ENCOUNTER — Telehealth: Payer: Self-pay | Admitting: Medical

## 2018-11-09 ENCOUNTER — Encounter (HOSPITAL_COMMUNITY): Payer: Self-pay | Admitting: Interventional Radiology

## 2018-11-09 ENCOUNTER — Encounter: Payer: Self-pay | Admitting: Radiation Oncology

## 2018-11-09 ENCOUNTER — Inpatient Hospital Stay (HOSPITAL_COMMUNITY): Payer: Self-pay

## 2018-11-09 ENCOUNTER — Ambulatory Visit
Admission: RE | Admit: 2018-11-09 | Discharge: 2018-11-09 | Disposition: A | Discharge status: Home / Self Care | Payer: Self-pay | Source: Ambulatory Visit | Attending: Radiation Oncology | Admitting: Radiation Oncology

## 2018-11-09 DIAGNOSIS — R945 Abnormal results of liver function studies: Secondary | ICD-10-CM

## 2018-11-09 DIAGNOSIS — R1310 Dysphagia, unspecified: Secondary | ICD-10-CM

## 2018-11-09 DIAGNOSIS — K8050 Calculus of bile duct without cholangitis or cholecystitis without obstruction: Secondary | ICD-10-CM

## 2018-11-09 HISTORY — PX: IR INT EXT BILIARY DRAIN WITH CHOLANGIOGRAM: IMG6044

## 2018-11-09 LAB — CBC WITH DIFFERENTIAL/PLATELET
Abs Immature Granulocytes: 0.05 10*3/uL (ref 0.00–0.07)
Basophils Absolute: 0 10*3/uL (ref 0.0–0.1)
Basophils Relative: 0 %
Eosinophils Absolute: 0.2 10*3/uL (ref 0.0–0.5)
Eosinophils Relative: 2 %
HCT: 42.8 % (ref 39.0–52.0)
HEMOGLOBIN: 12.8 g/dL — AB (ref 13.0–17.0)
Immature Granulocytes: 1 %
Lymphocytes Relative: 3 %
Lymphs Abs: 0.2 10*3/uL — ABNORMAL LOW (ref 0.7–4.0)
MCH: 23.7 pg — ABNORMAL LOW (ref 26.0–34.0)
MCHC: 29.9 g/dL — ABNORMAL LOW (ref 30.0–36.0)
MCV: 79.1 fL — ABNORMAL LOW (ref 80.0–100.0)
Monocytes Absolute: 0.6 10*3/uL (ref 0.1–1.0)
Monocytes Relative: 8 %
NEUTROS PCT: 86 %
Neutro Abs: 7.2 10*3/uL (ref 1.7–7.7)
Platelets: 234 10*3/uL (ref 150–400)
RBC: 5.41 MIL/uL (ref 4.22–5.81)
RDW: 19.9 % — ABNORMAL HIGH (ref 11.5–15.5)
WBC: 8.3 10*3/uL (ref 4.0–10.5)
nRBC: 0 % (ref 0.0–0.2)

## 2018-11-09 LAB — COMPREHENSIVE METABOLIC PANEL
ALK PHOS: 452 U/L — AB (ref 38–126)
ALT: 313 U/L — ABNORMAL HIGH (ref 0–44)
AST: 146 U/L — ABNORMAL HIGH (ref 15–41)
Albumin: 2.8 g/dL — ABNORMAL LOW (ref 3.5–5.0)
Anion gap: 10 (ref 5–15)
BUN: 10 mg/dL (ref 6–20)
CO2: 26 mmol/L (ref 22–32)
Calcium: 8.7 mg/dL — ABNORMAL LOW (ref 8.9–10.3)
Chloride: 103 mmol/L (ref 98–111)
Creatinine, Ser: 0.49 mg/dL — ABNORMAL LOW (ref 0.61–1.24)
GFR calc Af Amer: >60 mL/min (ref >60)
GFR calc non Af Amer: >60 mL/min (ref >60)
Glucose, Bld: 173 mg/dL — ABNORMAL HIGH (ref 70–99)
Potassium: 3.6 mmol/L (ref 3.5–5.1)
Sodium: 139 mmol/L (ref 135–145)
Total Bilirubin: 8.9 mg/dL — ABNORMAL HIGH (ref 0.3–1.2)
Total Protein: 6.3 g/dL — ABNORMAL LOW (ref 6.5–8.1)

## 2018-11-09 LAB — GLUCOSE, CAPILLARY
Glucose-Capillary: 259 mg/dL — ABNORMAL HIGH (ref 70–99)
Glucose-Capillary: 234 mg/dL — ABNORMAL HIGH (ref 70–99)
Glucose-Capillary: 177 mg/dL — ABNORMAL HIGH (ref 70–99)
Glucose-Capillary: 202 mg/dL — ABNORMAL HIGH (ref 70–99)
Glucose-Capillary: 182 mg/dL — ABNORMAL HIGH (ref 70–99)
Glucose-Capillary: 255 mg/dL — ABNORMAL HIGH (ref 70–99)
Glucose-Capillary: 192 mg/dL — ABNORMAL HIGH (ref 70–99)

## 2018-11-09 LAB — MAGNESIUM: Magnesium: 1.9 mg/dL (ref 1.7–2.4)

## 2018-11-09 LAB — HIV ANTIBODY (ROUTINE TESTING W REFLEX): HIV Screen 4th Generation wRfx: NONREACTIVE

## 2018-11-09 MED ORDER — FENTANYL CITRATE (PF) 100 MCG/2ML IJ SOLN
INTRAMUSCULAR | Status: AC | PRN
  Administered 2018-11-09: 50 ug via INTRAVENOUS

## 2018-11-09 MED ORDER — IOPAMIDOL (ISOVUE-300) INJECTION 61%: 50.0000 mL | Freq: Once | INTRAVENOUS | Status: DC | PRN

## 2018-11-09 MED ORDER — HYDROMORPHONE HCL 1 MG/ML IJ SOLN
INTRAMUSCULAR | Status: AC | PRN
  Administered 2018-11-09 (×3): 1 mg via INTRAVENOUS

## 2018-11-09 MED ORDER — HYDROMORPHONE HCL 1 MG/ML IJ SOLN
INTRAMUSCULAR | Status: AC
  Filled 2018-11-09: qty 1

## 2018-11-09 MED ORDER — CLONIDINE HCL 0.1 MG/24HR TD PTWK
0.1000 mg | MEDICATED_PATCH | TRANSDERMAL | Status: DC
  Administered 2018-11-09: 0.1 mg via TRANSDERMAL
  Filled 2018-11-09: qty 1

## 2018-11-09 MED ORDER — LIDOCAINE-EPINEPHRINE (PF) 1 %-1:200000 IJ SOLN
INTRAMUSCULAR | Status: AC | PRN
  Administered 2018-11-09 (×2): 5 mL

## 2018-11-09 MED ORDER — FENTANYL CITRATE (PF) 100 MCG/2ML IJ SOLN
INTRAMUSCULAR | Status: AC
  Filled 2018-11-09: qty 2

## 2018-11-09 MED ORDER — MIDAZOLAM HCL 2 MG/2ML IJ SOLN
INTRAMUSCULAR | Status: AC | PRN
  Administered 2018-11-09 (×4): 1 mg via INTRAVENOUS

## 2018-11-09 MED ORDER — IOPAMIDOL (ISOVUE-300) INJECTION 61%
INTRAVENOUS | Status: AC
  Filled 2018-11-09: qty 50

## 2018-11-09 MED ORDER — HYDRALAZINE HCL 20 MG/ML IJ SOLN
5.0000 mg | Freq: Four times a day (QID) | INTRAMUSCULAR | Status: DC | PRN
  Administered 2018-11-09 – 2018-11-10 (×2): 5 mg via INTRAVENOUS
  Filled 2018-11-09 (×2): qty 1

## 2018-11-09 MED ORDER — HYDROMORPHONE HCL 2 MG/ML IJ SOLN
INTRAMUSCULAR | Status: AC
  Filled 2018-11-09: qty 1

## 2018-11-09 MED ORDER — METOPROLOL TARTRATE 5 MG/5ML IV SOLN
2.5000 mg | Freq: Four times a day (QID) | INTRAVENOUS | Status: DC | PRN
  Administered 2018-11-09 – 2018-11-11 (×2): 2.5 mg via INTRAVENOUS
  Filled 2018-11-09 (×2): qty 5

## 2018-11-09 MED ORDER — LIDOCAINE-EPINEPHRINE (PF) 2 %-1:200000 IJ SOLN
INTRAMUSCULAR | Status: AC
  Filled 2018-11-09: qty 10

## 2018-11-09 MED ORDER — MIDAZOLAM HCL 2 MG/2ML IJ SOLN
INTRAMUSCULAR | Status: AC
  Filled 2018-11-09: qty 4

## 2018-11-09 MED ORDER — VANCOMYCIN HCL IN DEXTROSE 1-5 GM/200ML-% IV SOLN
1000.0000 mg | Freq: Once | INTRAVENOUS | Status: AC
  Administered 2018-11-09: 1000 mg via INTRAVENOUS

## 2018-11-09 MED ORDER — HYDROMORPHONE HCL 1 MG/ML IJ SOLN
0.5000 mg | INTRAMUSCULAR | Status: AC | PRN
  Administered 2018-11-09 – 2018-11-10 (×3): 0.5 mg via INTRAVENOUS
  Filled 2018-11-09 (×3): qty 0.5

## 2018-11-09 MED ORDER — VANCOMYCIN HCL IN DEXTROSE 1-5 GM/200ML-% IV SOLN
INTRAVENOUS | Status: AC
  Administered 2018-11-09: 1000 mg via INTRAVENOUS
  Filled 2018-11-09: qty 200

## 2018-11-09 NOTE — Progress Notes (Addendum)
Initial Nutrition Assessment  DOCUMENTATION CODES:   Non-severe (moderate) malnutrition in context of chronic illness, Obesity unspecified  INTERVENTION:   Glucerna 1.5 @ 40 ml/hr via J-tube Increase by 10 ml Q8 hours to goal rate of 70 ml/hr (1680 ml)  At goal TF provides: 2492 cal, 137 g protein, 1260 mL free water. Meets 100% of needs.   Monitor magnesium, potassium, and phosphorus daily for at least 3 days, MD to replete as needed, as pt is at risk for refeeding syndrome.   NUTRITION DIAGNOSIS:   Moderate Malnutrition related to chronic illness, cancer and cancer related treatments as evidenced by percent weight loss, mild fat depletion, mild muscle depletion, moderate muscle depletion.  GOAL:   Patient will meet greater than or equal to 90% of their needs  MONITOR:   PO intake, Supplement acceptance, Labs, Weight trends, TF tolerance, Skin, I & O's  REASON FOR ASSESSMENT:   Malnutrition Screening Tool    ASSESSMENT:   Patient with PMH significant for metastatic esophageal adenocarcinoma stage IV s/p radiation, s/p J-tube (12/3), gastric bypass, DM, HTN, CAD, and MI. Presents this admission with abnormal LFTs with elevated bilirubin. MRCP shows CBD narrowing with concern for stricture/inflammation or cholangitis.    Pt not taking anything by mouth at this time. He occasionally rinses his mouth with ginger ale and spits it back. He was started on Jevity 1.2 @ 70 ml/hr on 12/4 but was recently changed to Glucerna 1.5 @ 70 ml/hr (provides 2492 cal, 137 g protein, 1260 mL free water) due to blood sugar issues. Pt states he has tolerated this well. He unhooks TF for a couple minutes to go to the restroom, but for the most part stays continuous x 24 hrs.   Pt is currently NPO at this time for possible ERCP. Will begin at home regimen once cleared by GI.   Pt endorses a UBW of 380 lb and a wt loss of 100 lb over the last year. Records indicate pt weighed 338 lb on 08/24/18 and  287 lb this admission (17% wt loss in 2 months significant for time frame). Nutrition-Focused physical exam completed. Malnutrition continues.   Medications reviewed and include: SSI, NS @ 75 ml/hr Labs reviewed: CBG 126-264   Diet Order:   Diet Order            Diet NPO time specified  Diet effective midnight              EDUCATION NEEDS:   Education needs have been addressed  Skin:  Skin Assessment: Skin Integrity Issues: Skin Integrity Issues:: Incisions Incisions: right upper neck  Last BM:  11/07/18  Height:   Ht Readings from Last 1 Encounters:  11/07/18 6' (1.829 m)    Weight:   Wt Readings from Last 1 Encounters:  11/07/18 130.4 kg    Ideal Body Weight:  80.9 kg  BMI:  Body mass index is 38.98 kg/m.  Estimated Nutritional Needs:   Kcal:  2400-2600 kcal  Protein:  125-140 grams  Fluid:  >/= 2.4 L/day   Mariana Single RD, LDN Clinical Nutrition Pager # - [phone]

## 2018-11-09 NOTE — Progress Notes (Signed)
Darrell Sutton   DOB:Aug 03, 1961   QZ#:[id number]   MAU#:[id number]  Oncology f/u   Subjective: Patient is afebrile, total bilirubin trending up, no other new complaints.  Objective:  Vitals:   11/09/18 1648 11/09/18 1714  BP: (!) 185/99 (!) 198/117  Pulse:  91  Resp:  18  Temp:  97.7 F (36.5 C)  SpO2:  94%    Body mass index is 38.98 kg/m.  Intake/Output Summary (Last 24 hours) at 11/09/2018 1802 Last data filed at 11/09/2018 1757 Gross per 24 hour  Intake 1113.33 ml  Output 300 ml  Net 813.33 ml     Sclerae icteric  Oropharynx clear  No peripheral adenopathy  Lungs clear -- no rales or rhonchi  Heart regular rate and rhythm  Abdomen benign  MSK no focal spinal tenderness, no peripheral edema  Neuro nonfocal    CBG (last 3)  Recent Labs    11/08/18 1715 11/08/18 2214 11/09/18 0723  GLUCAP 177* 126* 202*     Labs:  Lab Results  Component Value Date   WBC 8.3 11/09/2018   HGB 12.8 (L) 11/09/2018   HCT 42.8 11/09/2018   MCV 79.1 (L) 11/09/2018   PLT 234 11/09/2018   NEUTROABS 7.2 11/09/2018    Urine Studies No results for input(s): UHGB, CRYS in the last 72 hours.  Invalid input(s): UACOL, UAPR, USPG, UPH, UTP, UGL, UKET, UBIL, UNIT, UROB, ULEU, UEPI, UWBC, URBC, UBAC, CAST, [place], [place]  Basic Metabolic Panel: Recent Labs  Lab 11/07/18 0923 11/07/18 1600 11/08/18 0500 11/09/18 0342  NA 136 137 139 139  K 4.1 4.1 4.0 3.6  CL 97* 99 101 103  CO2 29 27 28 26   GLUCOSE 389* 326* 252* 173*  BUN 12 14 12 10   CREATININE 0.85 0.55* 0.51* 0.49*  CALCIUM 9.5 8.9 8.7* 8.7*  MG  --   --   --  1.9   GFR Estimated Creatinine Clearance: 142.2 mL/min (A) (by C-G formula based on SCr of 0.49 mg/dL (L)). Liver Function Tests: Recent Labs  Lab 11/07/18 0923 11/07/18 1600 11/08/18 0500 11/09/18 0342  AST 235* 206* 171* 146*  ALT 503* 443* 373* 313*  ALKPHOS 616* 486* 454* 452*  BILITOT 8.0* 7.6* 7.7* 8.9*  PROT 7.3 7.0 6.4* 6.3*  ALBUMIN 3.0* 3.2*  2.8* 2.8*   Recent Labs  Lab 11/07/18 1600  LIPASE 26   No results for input(s): AMMONIA in the last 168 hours. Coagulation profile Recent Labs  Lab 11/07/18 1600 11/08/18 0500  INR 1.11 1.16    CBC: Recent Labs  Lab 11/07/18 0923 11/07/18 1600 11/08/18 0500 11/09/18 0342  WBC 8.0 7.4 7.2 8.3  NEUTROABS 7.1  --   --  7.2  HGB 14.1 13.4 13.3 12.8*  HCT 46.5 46.3 45.3 42.8  MCV 76.5* 80.4 78.9* 79.1*  PLT 285 273 270 234   Cardiac Enzymes: No results for input(s): CKTOTAL, CKMB, CKMBINDEX, TROPONINI in the last 168 hours. BNP: Invalid input(s): POCBNP CBG: Recent Labs  Lab 11/08/18 0752 11/08/18 1150 11/08/18 1715 11/08/18 2214 11/09/18 0723  GLUCAP 259* 234* 177* 126* 202*   D-Dimer No results for input(s): DDIMER in the last 72 hours. Hgb A1c No results for input(s): HGBA1C in the last 72 hours. Lipid Profile No results for input(s): CHOL, HDL, LDLCALC, TRIG, CHOLHDL, LDLDIRECT in the last 72 hours. Thyroid function studies No results for input(s): TSH, T4TOTAL, T3FREE, THYROIDAB in the last 72 hours.  Invalid input(s): FREET3 Anemia work up No results  for input(s): VITAMINB12, FOLATE, FERRITIN, TIBC, IRON, RETICCTPCT in the last 72 hours. Microbiology No results found for this or any previous visit (from the past 240 hour(s)).    Studies:  Mr 3d Recon At Scanner  Result Date: 11/07/2018 CLINICAL DATA:  Jaundice. Elevated liver function tests. History of esophageal cancer. EXAM: MRI ABDOMEN WITHOUT AND WITH CONTRAST (INCLUDING MRCP) TECHNIQUE: Multiplanar multisequence MR imaging of the abdomen was performed both before and after the administration of intravenous contrast. Heavily T2-weighted images of the biliary and pancreatic ducts were obtained, and three-dimensional MRCP images were rendered by post processing. CONTRAST:  10 cc Gadavist COMPARISON:  Multiple exams, including ultrasound 11/07/2018 and PET-CT from 09/29/2018 FINDINGS: Lower chest:  Distal esophageal wall thickening with surrounding edema noted in this patient with history of esophageal mass. There is scarring or atelectasis in the right lower lobe. Hepatobiliary: Sludge in the gallbladder. Mild intrahepatic biliary dilatation with abrupt tapered/beaked appearance of the common bile duct approximately corresponding to its course along the pancreatic head. No well-defined filling defect. The common hepatic duct measures about 8 mm in diameter proximal to this area of narrowing. In the area of narrowing there is subtly accentuated enhancement in the expected vicinity of the dorsal pancreatic duct. The appearance is not classic for pancreatic malignancy, there is no associated hypoenhancement in this region. Pancreas: No discrete pancreatic mass is identified. As noted above, the narrowed segment of the CBD in the vicinity of the pancreatic head may potentially have faintly associated accentuated enhancement. Spleen:  Unremarkable Adrenals/Urinary Tract: Right mid kidney Bosniak category 2 cyst anteriorly. Otherwise unremarkable. Adrenal glands normal. Stomach/Bowel: There is wall thickening in the gastric cardia with surrounding stranding and indistinctness of tissue planes. Jejunostomy tube noted. Vascular/Lymphatic: A porta hepatis node measures 1.6 cm in short axis on image 48/1102. Another porta hepatis node measures 1.2 cm in short axis on image 45/1102. Smaller periaortic lymph nodes are present and there are small lymph nodes around the distal esophagus and gastric cardia. Other:  No supplemental non-categorized findings. Musculoskeletal: Unremarkable IMPRESSION: 1. Marked narrowing in a 2.3 cm segment of the common bile duct corresponding with the segment of the duct along the pancreas. I do not see a definite pancreatic lesion but there is some faintly accentuated enhancement in the CBD in this vicinity. This could be from localized inflammation/cholangitis or a stricture. The CBD  proximal to this stricture is 0.8 cm in diameter. Sludge noted in the gallbladder without definite stones. 2. Distal esophageal wall thickening and thickening in the proximal stomach. There are also postoperative findings in the stomach. Surrounding inflammatory stranding is noted around the distal esophagus and proximal stomach, possibly therapy related. Surrounding small lymph nodes noted along with enlarged lymph nodes in the porta hepatis which are nonspecific and could be reactive or malignant. 3. Scarring or atelectasis in the right lower lobe. Electronically Signed   By: Van Clines M.D.   On: 11/07/2018 19:06   Mr Abdomen Mrcp Moise Boring Contast  Result Date: 11/07/2018 CLINICAL DATA:  Jaundice. Elevated liver function tests. History of esophageal cancer. EXAM: MRI ABDOMEN WITHOUT AND WITH CONTRAST (INCLUDING MRCP) TECHNIQUE: Multiplanar multisequence MR imaging of the abdomen was performed both before and after the administration of intravenous contrast. Heavily T2-weighted images of the biliary and pancreatic ducts were obtained, and three-dimensional MRCP images were rendered by post processing. CONTRAST:  10 cc Gadavist COMPARISON:  Multiple exams, including ultrasound 11/07/2018 and PET-CT from 09/29/2018 FINDINGS: Lower chest: Distal esophageal wall  thickening with surrounding edema noted in this patient with history of esophageal mass. There is scarring or atelectasis in the right lower lobe. Hepatobiliary: Sludge in the gallbladder. Mild intrahepatic biliary dilatation with abrupt tapered/beaked appearance of the common bile duct approximately corresponding to its course along the pancreatic head. No well-defined filling defect. The common hepatic duct measures about 8 mm in diameter proximal to this area of narrowing. In the area of narrowing there is subtly accentuated enhancement in the expected vicinity of the dorsal pancreatic duct. The appearance is not classic for pancreatic malignancy,  there is no associated hypoenhancement in this region. Pancreas: No discrete pancreatic mass is identified. As noted above, the narrowed segment of the CBD in the vicinity of the pancreatic head may potentially have faintly associated accentuated enhancement. Spleen:  Unremarkable Adrenals/Urinary Tract: Right mid kidney Bosniak category 2 cyst anteriorly. Otherwise unremarkable. Adrenal glands normal. Stomach/Bowel: There is wall thickening in the gastric cardia with surrounding stranding and indistinctness of tissue planes. Jejunostomy tube noted. Vascular/Lymphatic: A porta hepatis node measures 1.6 cm in short axis on image 48/1102. Another porta hepatis node measures 1.2 cm in short axis on image 45/1102. Smaller periaortic lymph nodes are present and there are small lymph nodes around the distal esophagus and gastric cardia. Other:  No supplemental non-categorized findings. Musculoskeletal: Unremarkable IMPRESSION: 1. Marked narrowing in a 2.3 cm segment of the common bile duct corresponding with the segment of the duct along the pancreas. I do not see a definite pancreatic lesion but there is some faintly accentuated enhancement in the CBD in this vicinity. This could be from localized inflammation/cholangitis or a stricture. The CBD proximal to this stricture is 0.8 cm in diameter. Sludge noted in the gallbladder without definite stones. 2. Distal esophageal wall thickening and thickening in the proximal stomach. There are also postoperative findings in the stomach. Surrounding inflammatory stranding is noted around the distal esophagus and proximal stomach, possibly therapy related. Surrounding small lymph nodes noted along with enlarged lymph nodes in the porta hepatis which are nonspecific and could be reactive or malignant. 3. Scarring or atelectasis in the right lower lobe. Electronically Signed   By: Van Clines M.D.   On: 11/07/2018 19:06   Ir Int Lianne Cure Biliary Drain With  Cholangiogram  Result Date: 11/09/2018 INDICATION: History of metastatic esophageal cancer, now with malignant biliary obstruction with associated jaundice and elevated bilirubin levels, presumably secondary to porta hepatis adenopathy. Patient with history of prior gastric bypass surgery and as such is not a candidate for attempted ERCP. Request made for placement of a percutaneous biliary drainage catheter for malignant biliary obstruction. EXAM: ULTRASOUND AND FLUOROSCOPIC GUIDED PERCUTANEOUS TRANSHEPATIC CHOLANGIOGRAM AND BILIARY TUBE PLACEMENT COMPARISON:  MRCP - 11/07/2018 MEDICATIONS: Vancomycin 1 gm IV; the antibiotic was administered within an appropriate time frame prior to the initiation of the procedure. CONTRAST:  20 cc Isovue-300, administered into biliary tree ANESTHESIA/SEDATION: Moderate (conscious) sedation was employed during this procedure. A total of Versed 4 mg, Dilaudid 3 mg IV and Fentanyl 50 mcg was administered intravenously. Moderate Sedation Time: 36 minutes. The patient's level of consciousness and vital signs were monitored continuously by radiology nursing throughout the procedure under my direct supervision. FLUOROSCOPY TIME:  6 minutes (188 mGy) COMPLICATIONS: None immediate. TECHNIQUE: Informed written consent was obtained from the patient and the patient's wife after a discussion of the risks, benefits and alternatives to treatment. Questions regarding the procedure were encouraged and answered. A timeout was performed prior to the initiation  of the procedure. The right upper abdominal quadrant was prepped and draped in the usual sterile fashion, and a sterile drape was applied covering the operative field. Maximum barrier sterile technique with sterile gowns and gloves were used for the procedure. A timeout was performed prior to the initiation of the procedure. Ultrasound scanning was performed of the midline of the upper abdomen demonstrated very minimal left-sided  intrahepatic biliary ductal dilatation. After lying soft tissues were anesthetized with 1% lidocaine with epinephrine, two attempts were made access the left biliary tree under direct ultrasound fluoroscopic guided however this ultimately proved unsuccessful secondary to lack of significant left-sided intrahepatic biliary duct dilatation. As such, a spot along the right mid axillary line was marked fluoroscopically inferior to the right costophrenic angle. A peripheral very mildly dilated duct within the anterior aspect of the right lobe of the liver was targeted under direct ultrasound guidance after the overlying soft tissues were anesthetized with 1% lidocaine with epinephrine. Contrast injection demonstrates intra biliary puncture. The access bile duct was cannulated with a Nitrex wire which was advanced to the central aspect of the biliary tree. Tract was dilated with the inner 3 French catheter of the [place] set. Contrast injection confirmed appropriate peripheral access. Next, a Nitrex wire was advanced to the level of the duodenum. The track was dilated with the remainder of the [place] set. A 4 French angled glide catheter was advanced through the outer sheath the [place]ust[place] set to the level of the duodenum. Contrast injection confirmed appropriate positioning Next, over a Amplatz wire, the tract was dilated and a 10.2 [place] biliary drainage catheter was advanced with coil ultimately locked within the duodenum. Contrast was injected and a completion radiograph was obtained. The catheter was connected to a drainage bag which yielded the brisk return of dark black colored bile. The catheter was secured to the skin with an interrupted suture. The patient tolerated the procedure well without immediate postprocedural complication. FINDINGS: Sonographic evaluation demonstrates very mild dilatation of the left intrahepatic biliary tree. Attempted though ultimately unsuccessful attempted left-sided biliary  drainage catheter placement secondary lack of significant left-sided intrahepatic biliary ductal dilatation. Sonographic evaluation demonstrates mild dilatation of the right intrahepatic biliary tree which was ultimately successfully opacified and cannulated, allowing placement of a 10 French percutaneous biliary drainage catheter via the peripheral aspect of a very mildly dilated bile duct within the anterior segment of the right lobe of the liver with end coiled and locked within the duodenum. The radiopaque marker is located within the central aspect of the right anterior biliary tree. IMPRESSION: Successful placement of a 10.2 French percutaneous biliary drainage catheter with end coiled and locked within the duodenum. PLAN: - Biliary brush biopsy and formal cholangiogram could be performed following normalization of the patient's LFTs as clinically indicated. - Otherwise, fluoroscopic guided biliary stent placement could be performed in 4-6 weeks as indicated. Electronically Signed   By: Sandi Mariscal M.D.   On: 11/09/2018 17:18    Assessment: 57 y.o. 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  Obstructive jaundice, unclear etiology 2.  Metastatic esophageal adenocarcinoma, plan to finish palliative radiation tomorrow 3.  Dysphagia and odynophagia, secondary to #2 and radiation, S/P jejunostomy tube placement for tube feeds  4. DM 5. HTN  6. CAD   Plan:  -I have  discussed with GI Dr. Ardis Hughs, unfortunately due to his significant dysphagia from esophageal cancer, radiation, and previous history of gastric bypass surgery, ERCP is not feasible.  -We have contacted interventional radiology for percutaneous biliary drainage, pt agrees.  -plan discussed with  Dr. Starla Link  -I will f/u as needed before his discharge, and plan to see him back within one week after discharge   Truitt Merle, MD 11/09/2018  6:02 PM

## 2018-11-09 NOTE — Progress Notes (Signed)
[facility] Therapy Treatment Record Phone [phone]   Radiation Therapy was administered to Darrell Sutton on: 11/09/2018  12:48 PM and was treatment # 13 out of a planned course of 13 treatments.  Radiation Treatment  1). Beam photons with 6-10 energy  2). Brachytherapy None  3). Stereotactic Radiosurgery None  4). Other Radiation None   Treatment Delivered at 910am  [place], Destiny Hagin, RT (T)

## 2018-11-09 NOTE — Consult Note (Signed)
Chief Complaint: Patient was seen in consultation today for obstructive jaundice.  Referring Physician(s): Starla Link, [place]  Supervising Physician: Sandi Mariscal  Patient Status: [facility] - In-pt  History of Present Illness: Darrell Sutton is a 57 y.o. 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 He presented to [facility] ED 11/07/2018 after being sent by his oncologist for abnormal laboratory results including elevated bilirubin and LFTs in addition to abdominal ultrasound concerning for biliary obstruction. He was admitted for further work-up and management. Due to prior Roux-en-Y surgery, ERCP cannot be preformed.  US abdomen 11/07/2018: 1. Dilatation of the common bile duct and gallbladder without obstructing lesion identified. No gallstones are evident but there is sludge within the lumen gallbladder. Consider contrast MRCP to further evaluate etiology of potential distal biliary obstruction. 2. No intrahepatic biliary duct dilatation identified.  MRCP 11/07/2018: 1. Marked narrowing in a 2.3 cm segment of the common bile duct corresponding with the segment of the duct along the pancreas. I do not see a definite pancreatic lesion but there is some faintly accentuated enhancement in the CBD in this vicinity. This could be from localized inflammation/cholangitis or a stricture. The CBD proximal to this stricture is 0.8 cm in diameter. Sludge noted in the gallbladder without definite stones. 2. Distal esophageal wall thickening and thickening in the proximal stomach. There are also postoperative findings in the stomach. Surrounding inflammatory stranding is noted around the distal esophagus and proximal stomach, possibly therapy related. Surrounding small lymph nodes noted along with enlarged lymph nodes in the porta hepatis which are nonspecific and could be  reactive or malignant. 3. Scarring or atelectasis in the right lower lobe.  IR requested by Dr. Starla Link for possible image-guided percutaneous biliary drain placement. Patient awake and alert laying in bed. Accompanied by wife at bedside. States he "feels terrible" and cannot swallow his own saliva. Denies fever, chills, chest pain, dyspnea, abdominal pain, dizziness, or headache.   Past Medical History:  Diagnosis Date  . Anxiety 2009   started with divorce  . Cancer [facility])    cancer of lower third of esophagus  . Diabetes mellitus without complication ([place])   . Gastric ulcer    from NSAID overuse  . GERD (gastroesophageal reflux disease)   . Hyperlipidemia   . Hypertension   . MI (myocardial infarction) ([place]) 2011   has two stents  . Obesity    highest weight in 500's    Past Surgical History:  Procedure Laterality Date  . [place] SURGERY  2010  . core biopsy  10/11/2018   core biopsy of lymph nodes in Interventional Radiology  . GASTRIC BYPASS  11/24/2008  . GASTROJEJUNOSTOMY N/A 10/17/2018   Procedure: LAPAROSCOPIC PLACEMENT OF  LEFT FEEDING JEJUNOSTOMY TUBE;  Surgeon: Michael Boston, MD;  Location: [facility] ORS;  Service: General;  Laterality: N/A;  . JOINT REPLACEMENT     Bilateral knee  . PORTACATH PLACEMENT Right 10/17/2018   Procedure: INSERTION PORT-A-CATH WITH ULTRASOUND AND FLUORO;  Surgeon: Michael Boston, MD;  Location: [facility] ORS;  Service: General;  Laterality: Right;  . TONSILECTOMY/ADENOIDECTOMY WITH MYRINGOTOMY      Allergies: Penicillins  Medications: Prior to Admission medications   Medication Sig Start Date End Date Taking? Authorizing Provider  aspirin EC 81 MG tablet Take 81 mg by mouth daily.   Yes [provider]  calcium carbonate (TUMS - DOSED IN MG ELEMENTAL CALCIUM)  500 MG chewable tablet Chew 2 tablets by mouth daily as needed for indigestion or heartburn.   Yes [provider]  cloNIDine (CATAPRES - DOSED IN MG/24 HR) 0.1 mg/24hr  patch Place 1 patch (0.1 mg total) onto the skin once a week. 10/31/18  Yes Tanner, Lyndon Code., [name]  insulin aspart protamine- aspart (NOVOLOG MIX 70/30) (70-30) 100 UNIT/ML injection Inject 0.07 mLs (7 Units total) into the skin 2 (two) times daily with a meal. 10/31/18  Yes Tanner, Lyndon Code., [name]  insulin regular (NOVOLIN R,HUMULIN R) 100 units/mL injection For glucose 151 to 200 use 2 units, for 201 to 250 use 5 units, for 251-300 use 7 units, for 301-350 use 9 units, for 351 to 400 use 12 units. Patient taking differently: Inject 2-12 Units into the skin 2 (two) times daily before a meal. For glucose 151 to 200 use 2 units, for 201 to 250 use 5 units, for 251-300 use 7 units, for 301-350 use 9 units, for 351 to 400 use 12 units. 10/31/18 10/31/19 Yes Tanner, Lyndon Code., [name]  LORazepam (ATIVAN) 0.5 MG tablet Place 1 tablet (0.5 mg total) under the tongue every 6 (six) hours as needed for anxiety. 10/31/18  Yes Tanner, Lyndon Code., [name]  Nutritional Supplements (FEEDING SUPPLEMENT, JEVITY 1.2 CAL,) LIQD Place 1,000 mLs into feeding tube daily. 10/19/18 01/17/19 Yes Michael Boston, MD  ondansetron [facility]) 4 MG/5ML solution Place 5 mLs (4 mg total) into feeding tube every 8 (eight) hours as needed for nausea or vomiting. 10/25/18  Yes [name], PA  scopolamine (TRANSDERM-SCOP) 1 MG/3DAYS Place 1 patch (1.5 mg total) onto the skin every 3 (three) days. 10/31/18  Yes Tanner, Lyndon Code., [name]  hydrochlorothiazide 10 mg/mL SUSP Place 2.5 mLs (25 mg total) into feeding tube daily. Patient not taking: Reported on 11/07/2018 10/31/18 11/30/18  Harle Stanford., [name]  insulin NPH-regular Human (NOVOLIN 70/30) (70-30) 100 UNIT/ML injection Inject 7 Units into the skin 2 (two) times daily with a meal. Relion please give the pen. Patient not taking: Reported on 11/07/2018 10/18/18 11/17/18  Arrien, Jimmy Picket, MD  metoprolol tartrate (LOPRESSOR) 25 mg/10 mL SUSP Place 30 mLs (75 mg total) into feeding tube 2 (two) times  daily. Patient not taking: Reported on 11/07/2018 10/31/18 11/30/18  Harle Stanford., [name]  Nutritional Supplements (FEEDING SUPPLEMENT, JEVITY 1.2 CAL,) LIQD Place 1,000 mLs into feeding tube daily. Patient not taking: Reported on 11/07/2018 10/19/18 11/18/18  Michael Boston, MD  oxyCODONE (ROXICODONE) 5 MG/5ML solution Take 5 mLs (5 mg total) by mouth every 6 (six) hours as needed for moderate pain or severe pain. Patient not taking: Reported on 11/07/2018 10/31/18   [name] E., [name]  pantoprazole sodium (PROTONIX) 40 mg/20 mL PACK Place 20 mLs (40 mg total) into feeding tube daily. Patient not taking: Reported on 11/07/2018 10/19/18   Michael Boston, MD  paroxetine (PAXIL) 10 MG/5ML suspension Take 20 mLs (40 mg total) by mouth every morning. Patient not taking: Reported on 11/07/2018 10/31/18 11/30/18  Harle Stanford., [name]  Syringe, Disposable, 3 ML MISC Insulin syringes and needles. Patient not taking: Reported on 11/07/2018 10/18/18   Arrien, Jimmy Picket, MD     Family History  Problem Relation Age of Onset  . Hyperlipidemia Mother   . Hypertension Mother   . Hyperlipidemia Father   . Hypertension Father   . Stroke Father   . Cancer Father        NHL  . Diabetes Brother   .  Heart attack Brother 59  . Hyperlipidemia Brother   . Hypertension Brother   . Heart attack Maternal Grandmother   . Heart attack Maternal Grandfather   . Early death Maternal Grandfather   . Esophageal cancer Paternal Grandmother   . Colon cancer Paternal Grandfather   . Colon cancer Cousin   . Stomach cancer Neg Hx     Social History   Socioeconomic History  . Marital status: Married    Spouse name: Not on file  . Number of children: 3  . Years of education: Not on file  . Highest education level: Not on file  Occupational History  . Not on file  Social Needs  . Financial resource strain: Not on file  . Food insecurity:    Worry: Not on file    Inability: Not on file  . Transportation  needs:    Medical: No    Non-medical: No  Tobacco Use  . Smoking status: Never Smoker  . Smokeless tobacco: Never Used  Substance and Sexual Activity  . Alcohol use: Never    Frequency: Never  . Drug use: Not Currently    Types: Marijuana    Comment: Youth years- not since earlt 90's  . Sexual activity: Not on file  Lifestyle  . Physical activity:    Days per week: Not on file    Minutes per session: Not on file  . Stress: Not on file  Relationships  . Social connections:    Talks on phone: Not on file    Gets together: Not on file    Attends religious service: Not on file    Active member of club or organization: Not on file    Attends meetings of clubs or organizations: Not on file    Relationship status: Not on file  Other Topics Concern  . Not on file  Social History Narrative   From [place], moved here Sep 2019 with his wife and son   Son with autism, starting at [employer]: A 12 point ROS discussed and pertinent positives are indicated in the HPI above.  All other systems are negative.  Review of Systems  Constitutional: Negative for chills and fever.  Respiratory: Negative for shortness of breath and wheezing.   Cardiovascular: Negative for chest pain and palpitations.  Gastrointestinal: Negative for abdominal pain.  Neurological: Negative for dizziness and headaches.  Psychiatric/Behavioral: Negative for behavioral problems and confusion.    Vital Signs: BP (!) 151/78 (BP Location: Right Arm)   Pulse 76   Temp 97.7 F (36.5 C) (Oral)   Resp 18   Ht 6' (1.829 m)   Wt 287 lb 6.4 oz (130.4 kg)   SpO2 99%   BMI 38.98 kg/m   Physical Exam Vitals signs and nursing note reviewed.  Constitutional:      General: He is not in acute distress.    Appearance: Normal appearance.  Cardiovascular:     Rate and Rhythm: Normal rate and regular rhythm.     Heart sounds: Normal heart sounds. No murmur.  Pulmonary:     Effort:  Pulmonary effort is normal. No respiratory distress.     Breath sounds: Normal breath sounds. No wheezing.  Skin:    General: Skin is warm and dry.  Neurological:     Mental Status: He is alert and oriented to person, place, and time.  Psychiatric:        Mood and Affect: Mood  normal.        Behavior: Behavior normal.        Thought Content: Thought content normal.        Judgment: Judgment normal.      MD Evaluation Airway: WNL Heart: WNL Abdomen: WNL Chest/ Lungs: WNL ASA  Classification: 3 Mallampati/Airway Score: One   Imaging: Dg Chest 1 View  Result Date: 10/17/2018 CLINICAL DATA:  Port-A-Cath placement. EXAM: CHEST  1 VIEW Radiation exposure index: 9.91 mGy. COMPARISON:  None. FINDINGS: Five intraoperative fluoroscopic images of the chest demonstrate placement of right-sided Port-A-Cath. Distal tip appears to be in expected position of cavoatrial junction. IMPRESSION: Fluoroscopic guidance provided during placement of right-sided Port-A-Cath. Electronically Signed   By: Marijo Conception, M.D.   On: 10/17/2018 15:51   US Abdomen Complete  Result Date: 11/07/2018 CLINICAL DATA:  Jaundice, rule out biliary obstruction EXAM: ABDOMEN ULTRASOUND COMPLETE COMPARISON:  CT 08/22/2018 FINDINGS: Gallbladder: Gallbladder is distended to 6 cm in diameter. There is sludge within the gallbladder. No echogenic gallstones. Negative sonographic Murphy's sign. No pericholecystic fluid. Significant gallbladder wall thickening. Common bile duct: Diameter: Dilated 9 mm. Liver: No focal lesion identified. Within normal limits in parenchymal echogenicity. Portal vein is patent on color Doppler imaging with normal direction of blood flow towards the liver. IVC: No abnormality visualized. Pancreas: Visualized portion unremarkable. Spleen: Size and appearance within normal limits. Right Kidney: Length: 12.6 cm. Cystic lesion with thin septation is coming anechoic measures 2.3 cm. This corresponds to  low-density cyst on comparison CT. Left Kidney: Length: 14.3 cm.  There is Abdominal aorta: No aneurysm visualized. Other findings: None. IMPRESSION: 1. Dilatation of the common bile duct and gallbladder without obstructing lesion identified. No gallstones are evident but there is sludge within the lumen gallbladder. Consider contrast MRCP to further evaluate etiology of potential distal biliary obstruction. 2. No intrahepatic biliary duct dilatation identified. Electronically Signed   By: Suzy Bouchard M.D.   On: 11/07/2018 14:52   Mr 3d Recon At Scanner  Result Date: 11/07/2018 CLINICAL DATA:  Jaundice. Elevated liver function tests. History of esophageal cancer. EXAM: MRI ABDOMEN WITHOUT AND WITH CONTRAST (INCLUDING MRCP) TECHNIQUE: Multiplanar multisequence MR imaging of the abdomen was performed both before and after the administration of intravenous contrast. Heavily T2-weighted images of the biliary and pancreatic ducts were obtained, and three-dimensional MRCP images were rendered by post processing. CONTRAST:  10 cc Gadavist COMPARISON:  Multiple exams, including ultrasound 11/07/2018 and PET-CT from 09/29/2018 FINDINGS: Lower chest: Distal esophageal wall thickening with surrounding edema noted in this patient with history of esophageal mass. There is scarring or atelectasis in the right lower lobe. Hepatobiliary: Sludge in the gallbladder. Mild intrahepatic biliary dilatation with abrupt tapered/beaked appearance of the common bile duct approximately corresponding to its course along the pancreatic head. No well-defined filling defect. The common hepatic duct measures about 8 mm in diameter proximal to this area of narrowing. In the area of narrowing there is subtly accentuated enhancement in the expected vicinity of the dorsal pancreatic duct. The appearance is not classic for pancreatic malignancy, there is no associated hypoenhancement in this region. Pancreas: No discrete pancreatic mass is  identified. As noted above, the narrowed segment of the CBD in the vicinity of the pancreatic head may potentially have faintly associated accentuated enhancement. Spleen:  Unremarkable Adrenals/Urinary Tract: Right mid kidney Bosniak category 2 cyst anteriorly. Otherwise unremarkable. Adrenal glands normal. Stomach/Bowel: There is wall thickening in the gastric cardia with surrounding stranding and indistinctness of  tissue planes. Jejunostomy tube noted. Vascular/Lymphatic: A porta hepatis node measures 1.6 cm in short axis on image 48/1102. Another porta hepatis node measures 1.2 cm in short axis on image 45/1102. Smaller periaortic lymph nodes are present and there are small lymph nodes around the distal esophagus and gastric cardia. Other:  No supplemental non-categorized findings. Musculoskeletal: Unremarkable IMPRESSION: 1. Marked narrowing in a 2.3 cm segment of the common bile duct corresponding with the segment of the duct along the pancreas. I do not see a definite pancreatic lesion but there is some faintly accentuated enhancement in the CBD in this vicinity. This could be from localized inflammation/cholangitis or a stricture. The CBD proximal to this stricture is 0.8 cm in diameter. Sludge noted in the gallbladder without definite stones. 2. Distal esophageal wall thickening and thickening in the proximal stomach. There are also postoperative findings in the stomach. Surrounding inflammatory stranding is noted around the distal esophagus and proximal stomach, possibly therapy related. Surrounding small lymph nodes noted along with enlarged lymph nodes in the porta hepatis which are nonspecific and could be reactive or malignant. 3. Scarring or atelectasis in the right lower lobe. Electronically Signed   By: Van Clines M.D.   On: 11/07/2018 19:06   Dg C-arm 1-60 Min-no Report  Result Date: 10/17/2018 Fluoroscopy was utilized by the requesting physician.  No radiographic interpretation.   Mr  Abdomen Mrcp Moise Boring Contast  Result Date: 11/07/2018 CLINICAL DATA:  Jaundice. Elevated liver function tests. History of esophageal cancer. EXAM: MRI ABDOMEN WITHOUT AND WITH CONTRAST (INCLUDING MRCP) TECHNIQUE: Multiplanar multisequence MR imaging of the abdomen was performed both before and after the administration of intravenous contrast. Heavily T2-weighted images of the biliary and pancreatic ducts were obtained, and three-dimensional MRCP images were rendered by post processing. CONTRAST:  10 cc Gadavist COMPARISON:  Multiple exams, including ultrasound 11/07/2018 and PET-CT from 09/29/2018 FINDINGS: Lower chest: Distal esophageal wall thickening with surrounding edema noted in this patient with history of esophageal mass. There is scarring or atelectasis in the right lower lobe. Hepatobiliary: Sludge in the gallbladder. Mild intrahepatic biliary dilatation with abrupt tapered/beaked appearance of the common bile duct approximately corresponding to its course along the pancreatic head. No well-defined filling defect. The common hepatic duct measures about 8 mm in diameter proximal to this area of narrowing. In the area of narrowing there is subtly accentuated enhancement in the expected vicinity of the dorsal pancreatic duct. The appearance is not classic for pancreatic malignancy, there is no associated hypoenhancement in this region. Pancreas: No discrete pancreatic mass is identified. As noted above, the narrowed segment of the CBD in the vicinity of the pancreatic head may potentially have faintly associated accentuated enhancement. Spleen:  Unremarkable Adrenals/Urinary Tract: Right mid kidney Bosniak category 2 cyst anteriorly. Otherwise unremarkable. Adrenal glands normal. Stomach/Bowel: There is wall thickening in the gastric cardia with surrounding stranding and indistinctness of tissue planes. Jejunostomy tube noted. Vascular/Lymphatic: A porta hepatis node measures 1.6 cm in short axis on image  48/1102. Another porta hepatis node measures 1.2 cm in short axis on image 45/1102. Smaller periaortic lymph nodes are present and there are small lymph nodes around the distal esophagus and gastric cardia. Other:  No supplemental non-categorized findings. Musculoskeletal: Unremarkable IMPRESSION: 1. Marked narrowing in a 2.3 cm segment of the common bile duct corresponding with the segment of the duct along the pancreas. I do not see a definite pancreatic lesion but there is some faintly accentuated enhancement in the CBD in  this vicinity. This could be from localized inflammation/cholangitis or a stricture. The CBD proximal to this stricture is 0.8 cm in diameter. Sludge noted in the gallbladder without definite stones. 2. Distal esophageal wall thickening and thickening in the proximal stomach. There are also postoperative findings in the stomach. Surrounding inflammatory stranding is noted around the distal esophagus and proximal stomach, possibly therapy related. Surrounding small lymph nodes noted along with enlarged lymph nodes in the porta hepatis which are nonspecific and could be reactive or malignant. 3. Scarring or atelectasis in the right lower lobe. Electronically Signed   By: Van Clines M.D.   On: 11/07/2018 19:06   [place] Core Biopsy (lymph Nodes)  Result Date: 10/11/2018 INDICATION: 57 year old with esophageal cancer and suspicious lymph nodes. Plan for ultrasound-guided biopsy of left supraclavicular lymph nodes. EXAM: ULTRASOUND-GUIDED BIOPSY OF LEFT SUPRACLAVICULAR LYMPH NODE MEDICATIONS: None. ANESTHESIA/SEDATION: Moderate (conscious) sedation was employed during this procedure. A total of Versed 2.0 mg and Fentanyl 75 mcg was administered intravenously. Moderate Sedation Time: 21 minutes. The patient's level of consciousness and vital signs were monitored continuously by radiology nursing throughout the procedure under my direct supervision. FLUOROSCOPY TIME:  None COMPLICATIONS: None  immediate. PROCEDURE: Informed written consent was obtained from the patient after a thorough discussion of the procedural risks, benefits and alternatives. All questions were addressed. A timeout was performed prior to the initiation of the procedure. Left side of the neck was evaluated with ultrasound. Abnormal lymph node/nodes in the left supraclavicular region was targeted for biopsy. The skin was prepped with chlorhexidine and sterile field was created. Skin and soft tissues anesthetized with 1% lidocaine. Using ultrasound guidance, 2 core biopsies were performed with a BioPince core device but no significant tissue was yielded. Therefore, a Bard [place] biopsy device was used. No significant tissue was obtained on the first Bard core pass. Additional core biopsies were obtained with the Bard device and 4 adequate core biopsies were obtained. Bandage placed over the puncture site. FINDINGS: Bilobed abnormal lymph node or adjacent lymph nodes in the left supraclavicular region. This bilobed lymphadenopathy was successfully biopsied. Needle position was confirmed within this lesion. IMPRESSION: Ultrasound-guided left supraclavicular lymph node biopsy. Electronically Signed   By: Markus Daft M.D.   On: 10/11/2018 17:45    Labs:  CBC: Recent Labs    11/07/18 0923 11/07/18 1600 11/08/18 0500 11/09/18 0342  WBC 8.0 7.4 7.2 8.3  HGB 14.1 13.4 13.3 12.8*  HCT 46.5 46.3 45.3 42.8  PLT 285 273 270 234    COAGS: Recent Labs    10/11/18 1134 11/07/18 1600 11/08/18 0500  INR 1.08 1.11 1.16    BMP: Recent Labs    11/07/18 0923 11/07/18 1600 11/08/18 0500 11/09/18 0342  NA 136 137 139 139  K 4.1 4.1 4.0 3.6  CL 97* 99 101 103  CO2 29 27 28 26   GLUCOSE 389* 326* 252* 173*  BUN 12 14 12 10   CALCIUM 9.5 8.9 8.7* 8.7*  CREATININE 0.85 0.55* 0.51* 0.49*  GFRNONAA >60 >60 >60 >60  GFRAA >60 >60 >60 >60    LIVER FUNCTION TESTS: Recent Labs    11/07/18 0923 11/07/18 1600 11/08/18 0500  11/09/18 0342  BILITOT 8.0* 7.6* 7.7* 8.9*  AST 235* 206* 171* 146*  ALT 503* 443* 373* 313*  ALKPHOS 616* 486* 454* 452*  PROT 7.3 7.0 6.4* 6.3*  ALBUMIN 3.0* 3.2* 2.8* 2.8*    TUMOR MARKERS: No results for input(s): AFPTM, CEA, CA199, CHROMGRNA in the last  8760 hours.  Assessment and Plan:  Obstructive jaundice. Plan for image-guided percutaneous biliary drain placement today with Dr. Pascal Lux. Patient is NPO. Afebrile and WBCs WNL. He does not take blood thinners. INR 1.16 seconds 11/08/2018.  Risks and benefits of biliary drain placement discussed with the patient including, but not limited to bleeding, infection which may lead to sepsis or even death and damage to adjacent structures. This interventional procedure involves the use of X-rays and because of the nature of the planned procedure, it is possible that we will have prolonged use of X-ray fluoroscopy. Potential radiation risks to you include (but are not limited to) the following: - A slightly elevated risk for cancer  several years later in life. This risk is typically less than 0.5% percent. This risk is low in comparison to the normal incidence of human cancer, which is 33% for women and 50% for men according to the [place]. - Radiation induced injury can include skin redness, resembling a rash, tissue breakdown / ulcers and hair loss (which can be temporary or permanent).  The likelihood of either of these occurring depends on the difficulty of the procedure and whether you are sensitive to radiation due to previous procedures, disease, or genetic conditions.  IF your procedure requires a prolonged use of radiation, you will be notified and given written instructions for further action.  It is your responsibility to monitor the irradiated area for the 2 weeks following the procedure and to notify your physician if you are concerned that you have suffered a radiation induced injury.   All of the patient's  questions were answered, patient is agreeable to proceed. Consent signed and in chart.   Thank you for this interesting consult.  I greatly enjoyed meeting NASIR BRIGHT and look forward to participating in their care.  A copy of this report was sent to the requesting provider on this date.  Electronically Signed: Earley Abide, [name] 11/09/2018, 3:20 PM   I spent a total of 40 Minutes in face to face in clinical consultation, greater than 50% of which was counseling/coordinating care for obstructive jaundice.

## 2018-11-09 NOTE — Telephone Encounter (Signed)
No los per 12/24. °

## 2018-11-09 NOTE — Progress Notes (Addendum)
Patient ID: Darrell Sutton, male   DOB: 1961-10-28, 57 y.o.   MRN: [id number]  PROGRESS NOTE    Darrell Sutton  HKV:[id number] DOB: 1961/08/11 DOA: 11/07/2018 PCP: [name], PA   Brief Narrative:   57 y.o. 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 [name] office with complaints of not feeling well for the last few days.  He was found to have abnormal LFTs, ultrasound of abdomen showed dilated common bile duct.  GI was consulted.  MRCP was ordered.   Assessment & Plan:   Principal Problem:   Abnormal LFTs Active Problems:   Obesity   Hypertension   Type 2 diabetes mellitus with complication, without long-term current use of insulin (HCC)   Cancer of lower third of esophagus (HCC)   Mechanical dysphagia from esophageal cancer   Jejunostomy tube present   Choledocholithiasis   Abnormal LFTs with elevated bilirubin  -MRCP shows CBD narrowing with concern for stricture/inflammation or cholangitis.  No evidence of fever or chills.  White count is normal.  Will not start antibiotics at this time until GI evaluation.  -GI following: Follow further recommendations regarding need for ERCP versus EUS -AST and ALT are improving, bilirubin is slightly worse compared to yesterday.  Repeat a.m. LFTs -We will keep him n.p.o. until further recommendations from GI.  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  Follow oncology recommendations  -Tube feedings will remain on hold until further evaluation by GI  Dehydration  -Continue normal saline at 75 cc an hour  Obesity -Outpatient follow-up  Hypertension -Monitor blood  pressure.  Continue clonidine patch  Diabetes mellitus type 2 uncontrolled with hyperglycemia -Continue home regimen.  CBGs with SSI   DVT prophylaxis: Lovenox Code Status: DNR Family Communication: None at bedside Disposition Plan: Home in 1 to 2 days once cleared by GI Consultants: GI/oncology  Procedures: None  Antimicrobials: None   Subjective: Patient seen and examined at bedside.  Denies any change in his status.  No overnight fever, nausea, vomiting or worsening abdominal pain. Objective: Vitals:   11/08/18 0544 11/08/18 1354 11/08/18 2115 11/09/18 0634  BP: (!) 153/85 (!) 163/90 (!) 167/83 (!) 154/86  Pulse: 73 81 70 75  Resp: 18 16 14 13   Temp: 97.6 F (36.4 C)  98.2 F (36.8 C) 97.8 F (36.6 C)  TempSrc: Oral  Oral Oral  SpO2: 100% 100% 100% 95%  Weight:      Height:        Intake/Output Summary (Last 24 hours) at 11/09/2018 1039 Last data filed at 11/09/2018 1000 Gross per 24 hour  Intake 1630.83 ml  Output 300 ml  Net 1330.83 ml   Filed Weights   11/07/18 1900  Weight: 130.4 kg    Examination:  General exam: Appears calm and comfortable, no distress Respiratory system: Bilateral decreased breath sounds at bases, no wheezing Cardiovascular system: Rate controlled, S1-S2 heard Gastrointestinal system: Abdomen is obese, nondistended, soft and nontender. Normal bowel sounds heard.  Jejunostomy tube in place Extremities: No cyanosis, edema   Data Reviewed: I have personally reviewed following labs and imaging studies  CBC: Recent Labs  Lab 11/07/18 0923 11/07/18 1600 11/08/18 0500 11/09/18 0342  WBC 8.0 7.4 7.2 8.3  NEUTROABS 7.1  --   --  7.2  HGB 14.1 13.4 13.3 12.8*  HCT 46.5 46.3 45.3 42.8  MCV 76.5* 80.4 78.9* 79.1*  PLT 285 273 270 716   Basic Metabolic Panel: Recent Labs  Lab 11/07/18 0923 11/07/18 1600 11/08/18 0500 11/09/18 0342  NA 136 137 139 139  K 4.1 4.1 4.0 3.6  CL 97* 99 101 103  CO2 29 27 28 26   GLUCOSE 389*  326* 252* 173*  BUN 12 14 12 10   CREATININE 0.85 0.55* 0.51* 0.49*  CALCIUM 9.5 8.9 8.7* 8.7*  MG  --   --   --  1.9   GFR: Estimated Creatinine Clearance: 142.2 mL/min (A) (by C-G formula based on SCr of 0.49 mg/dL (L)). Liver Function Tests: Recent Labs  Lab 11/07/18 0923 11/07/18 1600 11/08/18 0500 11/09/18 0342  AST 235* 206* 171* 146*  ALT 503* 443* 373* 313*  ALKPHOS 616* 486* 454* 452*  BILITOT 8.0* 7.6* 7.7* 8.9*  PROT 7.3 7.0 6.4* 6.3*  ALBUMIN 3.0* 3.2* 2.8* 2.8*   Recent Labs  Lab 11/07/18 1600  LIPASE 26   No results for input(s): AMMONIA in the last 168 hours. Coagulation Profile: Recent Labs  Lab 11/07/18 1600 11/08/18 0500  INR 1.11 1.16   Cardiac Enzymes: No results for input(s): CKTOTAL, CKMB, CKMBINDEX, TROPONINI in the last 168 hours. BNP (last 3 results) No results for input(s): PROBNP in the last 8760 hours. HbA1C: No results for input(s): HGBA1C in the last 72 hours. CBG: Recent Labs  Lab 11/08/18 0752 11/08/18 1150 11/08/18 1715 11/08/18 2214 11/09/18 0723  GLUCAP 259* 234* 177* 126* 202*   Lipid Profile: No results for input(s): CHOL, HDL, LDLCALC, TRIG, CHOLHDL, LDLDIRECT in the last 72 hours. Thyroid Function Tests: No results for input(s): TSH, T4TOTAL, FREET4, T3FREE, THYROIDAB in the last 72 hours. Anemia Panel: No results for input(s): VITAMINB12, FOLATE, FERRITIN, TIBC, IRON, RETICCTPCT in the last 72 hours. Sepsis Labs: No results for input(s): PROCALCITON, LATICACIDVEN in the last 168 hours.  No results found for this or any previous visit (from the past 240 hour(s)).       Radiology Studies: US Abdomen Complete  Result Date: 11/07/2018 CLINICAL DATA:  Jaundice, rule out biliary obstruction EXAM: ABDOMEN ULTRASOUND COMPLETE COMPARISON:  CT 08/22/2018 FINDINGS: Gallbladder: Gallbladder is distended to 6 cm in diameter. There is sludge within the gallbladder. No echogenic gallstones. Negative sonographic Murphy's  sign. No pericholecystic fluid. Significant gallbladder wall thickening. Common bile duct: Diameter: Dilated 9 mm. Liver: No focal lesion identified. Within normal limits in parenchymal echogenicity. Portal vein is patent on color Doppler imaging with normal direction of blood flow towards the liver. IVC: No abnormality visualized. Pancreas: Visualized portion unremarkable. Spleen: Size and appearance within normal limits. Right Kidney: Length: 12.6 cm. Cystic lesion with thin septation is coming anechoic measures 2.3 cm. This corresponds to low-density cyst on comparison CT. Left Kidney: Length: 14.3 cm.  There is Abdominal aorta: No aneurysm visualized. Other findings: None. IMPRESSION: 1. Dilatation of the common bile duct and gallbladder without obstructing lesion identified. No gallstones are evident but there is sludge within the lumen gallbladder. Consider contrast MRCP to further evaluate etiology of potential distal biliary obstruction. 2. No intrahepatic biliary duct dilatation identified. Electronically Signed   By: Suzy Bouchard M.D.   On: 11/07/2018 14:52   Mr 3d Recon At Scanner  Result Date: 11/07/2018 CLINICAL DATA:  Jaundice. Elevated liver function tests. History of esophageal cancer. EXAM: MRI ABDOMEN WITHOUT AND WITH CONTRAST (INCLUDING MRCP) TECHNIQUE: Multiplanar  multisequence MR imaging of the abdomen was performed both before and after the administration of intravenous contrast. Heavily T2-weighted images of the biliary and pancreatic ducts were obtained, and three-dimensional MRCP images were rendered by post processing. CONTRAST:  10 cc Gadavist COMPARISON:  Multiple exams, including ultrasound 11/07/2018 and PET-CT from 09/29/2018 FINDINGS: Lower chest: Distal esophageal wall thickening with surrounding edema noted in this patient with history of esophageal mass. There is scarring or atelectasis in the right lower lobe. Hepatobiliary: Sludge in the gallbladder. Mild intrahepatic  biliary dilatation with abrupt tapered/beaked appearance of the common bile duct approximately corresponding to its course along the pancreatic head. No well-defined filling defect. The common hepatic duct measures about 8 mm in diameter proximal to this area of narrowing. In the area of narrowing there is subtly accentuated enhancement in the expected vicinity of the dorsal pancreatic duct. The appearance is not classic for pancreatic malignancy, there is no associated hypoenhancement in this region. Pancreas: No discrete pancreatic mass is identified. As noted above, the narrowed segment of the CBD in the vicinity of the pancreatic head may potentially have faintly associated accentuated enhancement. Spleen:  Unremarkable Adrenals/Urinary Tract: Right mid kidney Bosniak category 2 cyst anteriorly. Otherwise unremarkable. Adrenal glands normal. Stomach/Bowel: There is wall thickening in the gastric cardia with surrounding stranding and indistinctness of tissue planes. Jejunostomy tube noted. Vascular/Lymphatic: A porta hepatis node measures 1.6 cm in short axis on image 48/1102. Another porta hepatis node measures 1.2 cm in short axis on image 45/1102. Smaller periaortic lymph nodes are present and there are small lymph nodes around the distal esophagus and gastric cardia. Other:  No supplemental non-categorized findings. Musculoskeletal: Unremarkable IMPRESSION: 1. Marked narrowing in a 2.3 cm segment of the common bile duct corresponding with the segment of the duct along the pancreas. I do not see a definite pancreatic lesion but there is some faintly accentuated enhancement in the CBD in this vicinity. This could be from localized inflammation/cholangitis or a stricture. The CBD proximal to this stricture is 0.8 cm in diameter. Sludge noted in the gallbladder without definite stones. 2. Distal esophageal wall thickening and thickening in the proximal stomach. There are also postoperative findings in the  stomach. Surrounding inflammatory stranding is noted around the distal esophagus and proximal stomach, possibly therapy related. Surrounding small lymph nodes noted along with enlarged lymph nodes in the porta hepatis which are nonspecific and could be reactive or malignant. 3. Scarring or atelectasis in the right lower lobe. Electronically Signed   By: Van Clines M.D.   On: 11/07/2018 19:06   Mr Abdomen Mrcp Moise Boring Contast  Result Date: 11/07/2018 CLINICAL DATA:  Jaundice. Elevated liver function tests. History of esophageal cancer. EXAM: MRI ABDOMEN WITHOUT AND WITH CONTRAST (INCLUDING MRCP) TECHNIQUE: Multiplanar multisequence MR imaging of the abdomen was performed both before and after the administration of intravenous contrast. Heavily T2-weighted images of the biliary and pancreatic ducts were obtained, and three-dimensional MRCP images were rendered by post processing. CONTRAST:  10 cc Gadavist COMPARISON:  Multiple exams, including ultrasound 11/07/2018 and PET-CT from 09/29/2018 FINDINGS: Lower chest: Distal esophageal wall thickening with surrounding edema noted in this patient with history of esophageal mass. There is scarring or atelectasis in the right lower lobe. Hepatobiliary: Sludge in the gallbladder. Mild intrahepatic biliary dilatation with abrupt tapered/beaked appearance of the common bile duct approximately corresponding to its course along the pancreatic head. No well-defined filling defect. The common hepatic duct measures about 8 mm in diameter proximal  to this area of narrowing. In the area of narrowing there is subtly accentuated enhancement in the expected vicinity of the dorsal pancreatic duct. The appearance is not classic for pancreatic malignancy, there is no associated hypoenhancement in this region. Pancreas: No discrete pancreatic mass is identified. As noted above, the narrowed segment of the CBD in the vicinity of the pancreatic head may potentially have faintly  associated accentuated enhancement. Spleen:  Unremarkable Adrenals/Urinary Tract: Right mid kidney Bosniak category 2 cyst anteriorly. Otherwise unremarkable. Adrenal glands normal. Stomach/Bowel: There is wall thickening in the gastric cardia with surrounding stranding and indistinctness of tissue planes. Jejunostomy tube noted. Vascular/Lymphatic: A porta hepatis node measures 1.6 cm in short axis on image 48/1102. Another porta hepatis node measures 1.2 cm in short axis on image 45/1102. Smaller periaortic lymph nodes are present and there are small lymph nodes around the distal esophagus and gastric cardia. Other:  No supplemental non-categorized findings. Musculoskeletal: Unremarkable IMPRESSION: 1. Marked narrowing in a 2.3 cm segment of the common bile duct corresponding with the segment of the duct along the pancreas. I do not see a definite pancreatic lesion but there is some faintly accentuated enhancement in the CBD in this vicinity. This could be from localized inflammation/cholangitis or a stricture. The CBD proximal to this stricture is 0.8 cm in diameter. Sludge noted in the gallbladder without definite stones. 2. Distal esophageal wall thickening and thickening in the proximal stomach. There are also postoperative findings in the stomach. Surrounding inflammatory stranding is noted around the distal esophagus and proximal stomach, possibly therapy related. Surrounding small lymph nodes noted along with enlarged lymph nodes in the porta hepatis which are nonspecific and could be reactive or malignant. 3. Scarring or atelectasis in the right lower lobe. Electronically Signed   By: Van Clines M.D.   On: 11/07/2018 19:06        Scheduled Meds: . [START ON 11/14/2018] cloNIDine  0.1 mg Transdermal Weekly  . insulin aspart  0-20 Units Subcutaneous TID WC  . insulin aspart  0-5 Units Subcutaneous QHS  . insulin aspart protamine- aspart  7 Units Subcutaneous BID WC  . [START ON  11/10/2018] scopolamine  1 patch Transdermal Q72H   Continuous Infusions: . sodium chloride 75 mL/hr at 11/08/18 1800     LOS: 1 day        Aline August, MD [facility] Pager [phone]  If 7PM-7AM, please contact night-coverage [email or web address] Password [facility] 11/09/2018, 10:39 AM

## 2018-11-09 NOTE — Progress Notes (Addendum)
[facility] Progress Note  CC:  Biliary obstruction  Subjective:  Just overall feels bad.  Cannot even swallow his own saliva well.  Just rinses his mouth with liquids.  No abdominal pain.  Objective:  Vital signs in last 24 hours: Temp:  [97.8 F (36.6 C)-98.2 F (36.8 C)] 97.8 F (36.6 C) (12/26 0634) Pulse Rate:  [70-81] 75 (12/26 0634) Resp:  [13-16] 13 (12/26 0634) BP: (154-167)/(83-90) 154/86 (12/26 0634) SpO2:  [95 %-100 %] 95 % (12/26 0634) Last BM Date: 11/07/18 General:  Alert, Well-developed, in NAD; jaundice noted. Heart:  Regular rate and rhythm; no murmurs Pulm:  CTAB.  No increased WOB. Abdomen:  Soft, non-distended.  BS present.  Non-tender.  J-tube noted in LUQ. Extremities:  Without edema. Neurologic:  Alert and oriented x 4;  grossly normal neurologically. Psych:  Alert and cooperative. Normal mood and affect.  Intake/Output from previous day: 12/25 0701 - 12/26 0700 In: 2130.8 [I.V.:2130.8] Out: 300 [Urine:300]  Lab Results: Recent Labs    11/07/18 1600 11/08/18 0500 11/09/18 0342  WBC 7.4 7.2 8.3  HGB 13.4 13.3 12.8*  HCT 46.3 45.3 42.8  PLT 273 270 234   BMET Recent Labs    11/07/18 1600 11/08/18 0500 11/09/18 0342  NA 137 139 139  K 4.1 4.0 3.6  CL 99 101 103  CO2 '27 28 26  ' GLUCOSE 326* 252* 173*  BUN '14 12 10  ' CREATININE 0.55* 0.51* 0.49*  CALCIUM 8.9 8.7* 8.7*   LFT Recent Labs    11/09/18 0342  PROT 6.3*  ALBUMIN 2.8*  AST 146*  ALT 313*  ALKPHOS 452*  BILITOT 8.9*   PT/INR Recent Labs    11/07/18 1600 11/08/18 0500  LABPROT 14.2 14.7  INR 1.11 1.16   US Abdomen Complete  Result Date: 11/07/2018 CLINICAL DATA:  Jaundice, rule out biliary obstruction EXAM: ABDOMEN ULTRASOUND COMPLETE COMPARISON:  CT 08/22/2018 FINDINGS: Gallbladder: Gallbladder is distended to 6 cm in diameter. There is sludge within the gallbladder. No echogenic gallstones. Negative sonographic Murphy's sign. No  pericholecystic fluid. Significant gallbladder wall thickening. Common bile duct: Diameter: Dilated 9 mm. Liver: No focal lesion identified. Within normal limits in parenchymal echogenicity. Portal vein is patent on color Doppler imaging with normal direction of blood flow towards the liver. IVC: No abnormality visualized. Pancreas: Visualized portion unremarkable. Spleen: Size and appearance within normal limits. Right Kidney: Length: 12.6 cm. Cystic lesion with thin septation is coming anechoic measures 2.3 cm. This corresponds to low-density cyst on comparison CT. Left Kidney: Length: 14.3 cm.  There is Abdominal aorta: No aneurysm visualized. Other findings: None. IMPRESSION: 1. Dilatation of the common bile duct and gallbladder without obstructing lesion identified. No gallstones are evident but there is sludge within the lumen gallbladder. Consider contrast MRCP to further evaluate etiology of potential distal biliary obstruction. 2. No intrahepatic biliary duct dilatation identified. Electronically Signed   By: Suzy Bouchard M.D.   On: 11/07/2018 14:52   Mr 3d Recon At Scanner  Result Date: 11/07/2018 CLINICAL DATA:  Jaundice. Elevated liver function tests. History of esophageal cancer. EXAM: MRI ABDOMEN WITHOUT AND WITH CONTRAST (INCLUDING MRCP) TECHNIQUE: Multiplanar multisequence MR imaging of the abdomen was performed both before and after the administration of intravenous contrast. Heavily T2-weighted images of the biliary and pancreatic ducts were obtained, and three-dimensional MRCP images were rendered by post processing. CONTRAST:  10 cc Gadavist COMPARISON:  Multiple exams, including ultrasound 11/07/2018 and PET-CT from 09/29/2018 FINDINGS: Lower  chest: Distal esophageal wall thickening with surrounding edema noted in this patient with history of esophageal mass. There is scarring or atelectasis in the right lower lobe. Hepatobiliary: Sludge in the gallbladder. Mild intrahepatic biliary  dilatation with abrupt tapered/beaked appearance of the common bile duct approximately corresponding to its course along the pancreatic head. No well-defined filling defect. The common hepatic duct measures about 8 mm in diameter proximal to this area of narrowing. In the area of narrowing there is subtly accentuated enhancement in the expected vicinity of the dorsal pancreatic duct. The appearance is not classic for pancreatic malignancy, there is no associated hypoenhancement in this region. Pancreas: No discrete pancreatic mass is identified. As noted above, the narrowed segment of the CBD in the vicinity of the pancreatic head may potentially have faintly associated accentuated enhancement. Spleen:  Unremarkable Adrenals/Urinary Tract: Right mid kidney Bosniak category 2 cyst anteriorly. Otherwise unremarkable. Adrenal glands normal. Stomach/Bowel: There is wall thickening in the gastric cardia with surrounding stranding and indistinctness of tissue planes. Jejunostomy tube noted. Vascular/Lymphatic: A porta hepatis node measures 1.6 cm in short axis on image 48/1102. Another porta hepatis node measures 1.2 cm in short axis on image 45/1102. Smaller periaortic lymph nodes are present and there are small lymph nodes around the distal esophagus and gastric cardia. Other:  No supplemental non-categorized findings. Musculoskeletal: Unremarkable IMPRESSION: 1. Marked narrowing in a 2.3 cm segment of the common bile duct corresponding with the segment of the duct along the pancreas. I do not see a definite pancreatic lesion but there is some faintly accentuated enhancement in the CBD in this vicinity. This could be from localized inflammation/cholangitis or a stricture. The CBD proximal to this stricture is 0.8 cm in diameter. Sludge noted in the gallbladder without definite stones. 2. Distal esophageal wall thickening and thickening in the proximal stomach. There are also postoperative findings in the stomach.  Surrounding inflammatory stranding is noted around the distal esophagus and proximal stomach, possibly therapy related. Surrounding small lymph nodes noted along with enlarged lymph nodes in the porta hepatis which are nonspecific and could be reactive or malignant. 3. Scarring or atelectasis in the right lower lobe. Electronically Signed   By: Van Clines M.D.   On: 11/07/2018 19:06   Mr Abdomen Mrcp Moise Boring Contast  Result Date: 11/07/2018 CLINICAL DATA:  Jaundice. Elevated liver function tests. History of esophageal cancer. EXAM: MRI ABDOMEN WITHOUT AND WITH CONTRAST (INCLUDING MRCP) TECHNIQUE: Multiplanar multisequence MR imaging of the abdomen was performed both before and after the administration of intravenous contrast. Heavily T2-weighted images of the biliary and pancreatic ducts were obtained, and three-dimensional MRCP images were rendered by post processing. CONTRAST:  10 cc Gadavist COMPARISON:  Multiple exams, including ultrasound 11/07/2018 and PET-CT from 09/29/2018 FINDINGS: Lower chest: Distal esophageal wall thickening with surrounding edema noted in this patient with history of esophageal mass. There is scarring or atelectasis in the right lower lobe. Hepatobiliary: Sludge in the gallbladder. Mild intrahepatic biliary dilatation with abrupt tapered/beaked appearance of the common bile duct approximately corresponding to its course along the pancreatic head. No well-defined filling defect. The common hepatic duct measures about 8 mm in diameter proximal to this area of narrowing. In the area of narrowing there is subtly accentuated enhancement in the expected vicinity of the dorsal pancreatic duct. The appearance is not classic for pancreatic malignancy, there is no associated hypoenhancement in this region. Pancreas: No discrete pancreatic mass is identified. As noted above, the narrowed segment of the  CBD in the vicinity of the pancreatic head may potentially have faintly associated  accentuated enhancement. Spleen:  Unremarkable Adrenals/Urinary Tract: Right mid kidney Bosniak category 2 cyst anteriorly. Otherwise unremarkable. Adrenal glands normal. Stomach/Bowel: There is wall thickening in the gastric cardia with surrounding stranding and indistinctness of tissue planes. Jejunostomy tube noted. Vascular/Lymphatic: A porta hepatis node measures 1.6 cm in short axis on image 48/1102. Another porta hepatis node measures 1.2 cm in short axis on image 45/1102. Smaller periaortic lymph nodes are present and there are small lymph nodes around the distal esophagus and gastric cardia. Other:  No supplemental non-categorized findings. Musculoskeletal: Unremarkable IMPRESSION: 1. Marked narrowing in a 2.3 cm segment of the common bile duct corresponding with the segment of the duct along the pancreas. I do not see a definite pancreatic lesion but there is some faintly accentuated enhancement in the CBD in this vicinity. This could be from localized inflammation/cholangitis or a stricture. The CBD proximal to this stricture is 0.8 cm in diameter. Sludge noted in the gallbladder without definite stones. 2. Distal esophageal wall thickening and thickening in the proximal stomach. There are also postoperative findings in the stomach. Surrounding inflammatory stranding is noted around the distal esophagus and proximal stomach, possibly therapy related. Surrounding small lymph nodes noted along with enlarged lymph nodes in the porta hepatis which are nonspecific and could be reactive or malignant. 3. Scarring or atelectasis in the right lower lobe. Electronically Signed   By: Van Clines M.D.   On: 11/07/2018 19:06   Assessment / Plan: *57 y.o. 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  1.  Obstructive jaundice, unclear etiology 2.  Metastatic esophageal adenocarcinoma on palliative radiation 3.  Dysphagia and odynophagia, secondary to #2 and radiation, S/P jejunostomy tube placement for tube feeds  4.  History of Roux-en-Y gastric bypass surgery  **Discussed with Dr. Ardis Hughs.  ERCP unfortunately will not be able to be performed here by our GI team due to his history or Roux-en-Y surgery.  Could consider IR for PTC drain and/or transfer to tertiary care center where they could attempt ERCP. **Otherwise trend LFT's and watch for signs of developing infection.    LOS: 1 day   Laban Emperor. Zehr  11/09/2018, 9:43 AM   ________________________________________________________________________  Velora Heckler GI MD note:  I personally examined the patient, reviewed the data and agree with the assessment and plan described above.  He has an obstructive jaundice, likely from periportal adenopathy causing compression on biliary tree. He has near complete esophageal obstruction from the cancer and also radiation related effect. It may take weeks for his esophagus to open up.  He also has Roux en Y anatomy.  ERCP attempt to decompress his biliary tree would be futile, I recommend IR PTC with goal to eventually place biliary stent.   Owens Loffler, MD [facility] Pager [phone]

## 2018-11-10 DIAGNOSIS — K831 Obstruction of bile duct: Secondary | ICD-10-CM

## 2018-11-10 DIAGNOSIS — C155 Malignant neoplasm of lower third of esophagus: Secondary | ICD-10-CM

## 2018-11-10 LAB — COMPREHENSIVE METABOLIC PANEL
ALT: 266 U/L — ABNORMAL HIGH (ref 0–44)
AST: 86 U/L — AB (ref 15–41)
Albumin: 2.9 g/dL — ABNORMAL LOW (ref 3.5–5.0)
Alkaline Phosphatase: 413 U/L — ABNORMAL HIGH (ref 38–126)
Anion gap: 11 (ref 5–15)
BUN: 12 mg/dL (ref 6–20)
CO2: 25 mmol/L (ref 22–32)
Calcium: 8.6 mg/dL — ABNORMAL LOW (ref 8.9–10.3)
Chloride: 103 mmol/L (ref 98–111)
Creatinine, Ser: 0.55 mg/dL — ABNORMAL LOW (ref 0.61–1.24)
GFR calc Af Amer: >60 mL/min (ref >60)
Glucose, Bld: 187 mg/dL — ABNORMAL HIGH (ref 70–99)
Potassium: 3.6 mmol/L (ref 3.5–5.1)
Sodium: 139 mmol/L (ref 135–145)
Total Bilirubin: 4.9 mg/dL — ABNORMAL HIGH (ref 0.3–1.2)
Total Protein: 6.4 g/dL — ABNORMAL LOW (ref 6.5–8.1)

## 2018-11-10 LAB — CBC WITH DIFFERENTIAL/PLATELET
Abs Immature Granulocytes: 0.07 10*3/uL (ref 0.00–0.07)
BASOS ABS: 0 10*3/uL (ref 0.0–0.1)
Basophils Relative: 0 %
Eosinophils Absolute: 0.1 10*3/uL (ref 0.0–0.5)
Eosinophils Relative: 0 %
HCT: 44.4 % (ref 39.0–52.0)
Hemoglobin: 13.3 g/dL (ref 13.0–17.0)
IMMATURE GRANULOCYTES: 1 %
Lymphocytes Relative: 1 %
Lymphs Abs: 0.1 10*3/uL — ABNORMAL LOW (ref 0.7–4.0)
MCH: 23.9 pg — ABNORMAL LOW (ref 26.0–34.0)
MCHC: 30 g/dL (ref 30.0–36.0)
MCV: 79.7 fL — ABNORMAL LOW (ref 80.0–100.0)
Monocytes Absolute: 0.6 10*3/uL (ref 0.1–1.0)
Monocytes Relative: 6 %
NEUTROS PCT: 92 %
Neutro Abs: 10.5 10*3/uL — ABNORMAL HIGH (ref 1.7–7.7)
Platelets: 235 10*3/uL (ref 150–400)
RBC: 5.57 MIL/uL (ref 4.22–5.81)
RDW: 20.6 % — ABNORMAL HIGH (ref 11.5–15.5)
WBC: 11.4 10*3/uL — ABNORMAL HIGH (ref 4.0–10.5)
nRBC: 0 % (ref 0.0–0.2)

## 2018-11-10 LAB — GLUCOSE, CAPILLARY
Glucose-Capillary: 209 mg/dL — ABNORMAL HIGH (ref 70–99)
Glucose-Capillary: 205 mg/dL — ABNORMAL HIGH (ref 70–99)
Glucose-Capillary: 230 mg/dL — ABNORMAL HIGH (ref 70–99)
Glucose-Capillary: 199 mg/dL — ABNORMAL HIGH (ref 70–99)

## 2018-11-10 LAB — MAGNESIUM: Magnesium: 1.9 mg/dL (ref 1.7–2.4)

## 2018-11-10 LAB — LIPASE, BLOOD: LIPASE: 286 U/L — AB (ref 11–51)

## 2018-11-10 MED ORDER — HYDROMORPHONE HCL 1 MG/ML IJ SOLN
0.5000 mg | INTRAMUSCULAR | Status: DC | PRN
  Administered 2018-11-10 – 2018-11-11 (×7): 0.5 mg via INTRAVENOUS
  Filled 2018-11-10 (×7): qty 0.5

## 2018-11-10 MED ORDER — OSMOLITE 1.5 CAL PO LIQD
1000.0000 mL | ORAL | Status: DC
  Administered 2018-11-10 – 2018-11-11 (×2): 1000 mL
  Filled 2018-11-10 (×2): qty 1000

## 2018-11-10 MED ORDER — JEVITY 1.2 CAL PO LIQD: 1000.0000 mL | ORAL | Status: DC

## 2018-11-10 NOTE — Progress Notes (Signed)
Referring Physician(s): Alekh,K  Supervising Physician: Marybelle Killings  Patient Status:  [facility] - In-pt  Chief Complaint:  Metastatic esophageal cancer, biliary obstruction  Subjective: Pt still with some occ dry heaves, RUQ soreness   Allergies: Penicillins  Medications: Prior to Admission medications   Medication Sig Start Date End Date Taking? Authorizing Provider  aspirin EC 81 MG tablet Take 81 mg by mouth daily.   Yes [provider]  calcium carbonate (TUMS - DOSED IN MG ELEMENTAL CALCIUM) 500 MG chewable tablet Chew 2 tablets by mouth daily as needed for indigestion or heartburn.   Yes [provider]  cloNIDine (CATAPRES - DOSED IN MG/24 HR) 0.1 mg/24hr patch Place 1 patch (0.1 mg total) onto the skin once a week. 10/31/18  Yes Tanner, Lyndon Code., [name]  insulin aspart protamine- aspart (NOVOLOG MIX 70/30) (70-30) 100 UNIT/ML injection Inject 0.07 mLs (7 Units total) into the skin 2 (two) times daily with a meal. 10/31/18  Yes Tanner, Lyndon Code., [name]  insulin regular (NOVOLIN R,HUMULIN R) 100 units/mL injection For glucose 151 to 200 use 2 units, for 201 to 250 use 5 units, for 251-300 use 7 units, for 301-350 use 9 units, for 351 to 400 use 12 units. Patient taking differently: Inject 2-12 Units into the skin 2 (two) times daily before a meal. For glucose 151 to 200 use 2 units, for 201 to 250 use 5 units, for 251-300 use 7 units, for 301-350 use 9 units, for 351 to 400 use 12 units. 10/31/18 10/31/19 Yes Tanner, Lyndon Code., [name]  LORazepam (ATIVAN) 0.5 MG tablet Place 1 tablet (0.5 mg total) under the tongue every 6 (six) hours as needed for anxiety. 10/31/18  Yes Tanner, Lyndon Code., [name]  Nutritional Supplements (FEEDING SUPPLEMENT, JEVITY 1.2 CAL,) LIQD Place 1,000 mLs into feeding tube daily. 10/19/18 01/17/19 Yes Michael Boston, MD  ondansetron [facility]) 4 MG/5ML solution Place 5 mLs (4 mg total) into feeding tube every 8 (eight) hours as needed for nausea or vomiting.  10/25/18  Yes [name], PA  scopolamine (TRANSDERM-SCOP) 1 MG/3DAYS Place 1 patch (1.5 mg total) onto the skin every 3 (three) days. 10/31/18  Yes Tanner, Lyndon Code., [name]  hydrochlorothiazide 10 mg/mL SUSP Place 2.5 mLs (25 mg total) into feeding tube daily. Patient not taking: Reported on 11/07/2018 10/31/18 11/30/18  Harle Stanford., [name]  insulin NPH-regular Human (NOVOLIN 70/30) (70-30) 100 UNIT/ML injection Inject 7 Units into the skin 2 (two) times daily with a meal. Relion please give the pen. Patient not taking: Reported on 11/07/2018 10/18/18 11/17/18  Arrien, Jimmy Picket, MD  metoprolol tartrate (LOPRESSOR) 25 mg/10 mL SUSP Place 30 mLs (75 mg total) into feeding tube 2 (two) times daily. Patient not taking: Reported on 11/07/2018 10/31/18 11/30/18  Harle Stanford., [name]  Nutritional Supplements (FEEDING SUPPLEMENT, JEVITY 1.2 CAL,) LIQD Place 1,000 mLs into feeding tube daily. Patient not taking: Reported on 11/07/2018 10/19/18 11/18/18  Michael Boston, MD  oxyCODONE (ROXICODONE) 5 MG/5ML solution Take 5 mLs (5 mg total) by mouth every 6 (six) hours as needed for moderate pain or severe pain. Patient not taking: Reported on 11/07/2018 10/31/18   [name] E., [name]  pantoprazole sodium (PROTONIX) 40 mg/20 mL PACK Place 20 mLs (40 mg total) into feeding tube daily. Patient not taking: Reported on 11/07/2018 10/19/18   Michael Boston, MD  paroxetine (PAXIL) 10 MG/5ML suspension Take 20 mLs (40 mg total) by mouth every morning. Patient not taking: Reported on 11/07/2018  10/31/18 11/30/18  Harle Stanford., [name]  Syringe, Disposable, 3 ML MISC Insulin syringes and needles. Patient not taking: Reported on 11/07/2018 10/18/18   Arrien, Jimmy Picket, MD     Vital Signs: BP (!) 180/97 (BP Location: Right Arm)   Pulse 87   Temp 98.6 F (37 C) (Oral)   Resp 16   Ht 6' (1.829 m)   Wt 287 lb 6.4 oz (130.4 kg)   SpO2 96%   BMI 38.98 kg/m   Physical Exam awake/alert; biliary drain intact,  dressing dry, site mildly tender, output 250 cc green bile; drain flushed without difficulty  Imaging: US Abdomen Complete  Result Date: 11/07/2018 CLINICAL DATA:  Jaundice, rule out biliary obstruction EXAM: ABDOMEN ULTRASOUND COMPLETE COMPARISON:  CT 08/22/2018 FINDINGS: Gallbladder: Gallbladder is distended to 6 cm in diameter. There is sludge within the gallbladder. No echogenic gallstones. Negative sonographic Murphy's sign. No pericholecystic fluid. Significant gallbladder wall thickening. Common bile duct: Diameter: Dilated 9 mm. Liver: No focal lesion identified. Within normal limits in parenchymal echogenicity. Portal vein is patent on color Doppler imaging with normal direction of blood flow towards the liver. IVC: No abnormality visualized. Pancreas: Visualized portion unremarkable. Spleen: Size and appearance within normal limits. Right Kidney: Length: 12.6 cm. Cystic lesion with thin septation is coming anechoic measures 2.3 cm. This corresponds to low-density cyst on comparison CT. Left Kidney: Length: 14.3 cm.  There is Abdominal aorta: No aneurysm visualized. Other findings: None. IMPRESSION: 1. Dilatation of the common bile duct and gallbladder without obstructing lesion identified. No gallstones are evident but there is sludge within the lumen gallbladder. Consider contrast MRCP to further evaluate etiology of potential distal biliary obstruction. 2. No intrahepatic biliary duct dilatation identified. Electronically Signed   By: Suzy Bouchard M.D.   On: 11/07/2018 14:52   Mr 3d Recon At Scanner  Result Date: 11/07/2018 CLINICAL DATA:  Jaundice. Elevated liver function tests. History of esophageal cancer. EXAM: MRI ABDOMEN WITHOUT AND WITH CONTRAST (INCLUDING MRCP) TECHNIQUE: Multiplanar multisequence MR imaging of the abdomen was performed both before and after the administration of intravenous contrast. Heavily T2-weighted images of the biliary and pancreatic ducts were obtained, and  three-dimensional MRCP images were rendered by post processing. CONTRAST:  10 cc Gadavist COMPARISON:  Multiple exams, including ultrasound 11/07/2018 and PET-CT from 09/29/2018 FINDINGS: Lower chest: Distal esophageal wall thickening with surrounding edema noted in this patient with history of esophageal mass. There is scarring or atelectasis in the right lower lobe. Hepatobiliary: Sludge in the gallbladder. Mild intrahepatic biliary dilatation with abrupt tapered/beaked appearance of the common bile duct approximately corresponding to its course along the pancreatic head. No well-defined filling defect. The common hepatic duct measures about 8 mm in diameter proximal to this area of narrowing. In the area of narrowing there is subtly accentuated enhancement in the expected vicinity of the dorsal pancreatic duct. The appearance is not classic for pancreatic malignancy, there is no associated hypoenhancement in this region. Pancreas: No discrete pancreatic mass is identified. As noted above, the narrowed segment of the CBD in the vicinity of the pancreatic head may potentially have faintly associated accentuated enhancement. Spleen:  Unremarkable Adrenals/Urinary Tract: Right mid kidney Bosniak category 2 cyst anteriorly. Otherwise unremarkable. Adrenal glands normal. Stomach/Bowel: There is wall thickening in the gastric cardia with surrounding stranding and indistinctness of tissue planes. Jejunostomy tube noted. Vascular/Lymphatic: A porta hepatis node measures 1.6 cm in short axis on image 48/1102. Another porta hepatis node measures 1.2 cm in short  axis on image 45/1102. Smaller periaortic lymph nodes are present and there are small lymph nodes around the distal esophagus and gastric cardia. Other:  No supplemental non-categorized findings. Musculoskeletal: Unremarkable IMPRESSION: 1. Marked narrowing in a 2.3 cm segment of the common bile duct corresponding with the segment of the duct along the pancreas. I do  not see a definite pancreatic lesion but there is some faintly accentuated enhancement in the CBD in this vicinity. This could be from localized inflammation/cholangitis or a stricture. The CBD proximal to this stricture is 0.8 cm in diameter. Sludge noted in the gallbladder without definite stones. 2. Distal esophageal wall thickening and thickening in the proximal stomach. There are also postoperative findings in the stomach. Surrounding inflammatory stranding is noted around the distal esophagus and proximal stomach, possibly therapy related. Surrounding small lymph nodes noted along with enlarged lymph nodes in the porta hepatis which are nonspecific and could be reactive or malignant. 3. Scarring or atelectasis in the right lower lobe. Electronically Signed   By: Van Clines M.D.   On: 11/07/2018 19:06   Mr Abdomen Mrcp Moise Boring Contast  Result Date: 11/07/2018 CLINICAL DATA:  Jaundice. Elevated liver function tests. History of esophageal cancer. EXAM: MRI ABDOMEN WITHOUT AND WITH CONTRAST (INCLUDING MRCP) TECHNIQUE: Multiplanar multisequence MR imaging of the abdomen was performed both before and after the administration of intravenous contrast. Heavily T2-weighted images of the biliary and pancreatic ducts were obtained, and three-dimensional MRCP images were rendered by post processing. CONTRAST:  10 cc Gadavist COMPARISON:  Multiple exams, including ultrasound 11/07/2018 and PET-CT from 09/29/2018 FINDINGS: Lower chest: Distal esophageal wall thickening with surrounding edema noted in this patient with history of esophageal mass. There is scarring or atelectasis in the right lower lobe. Hepatobiliary: Sludge in the gallbladder. Mild intrahepatic biliary dilatation with abrupt tapered/beaked appearance of the common bile duct approximately corresponding to its course along the pancreatic head. No well-defined filling defect. The common hepatic duct measures about 8 mm in diameter proximal to this  area of narrowing. In the area of narrowing there is subtly accentuated enhancement in the expected vicinity of the dorsal pancreatic duct. The appearance is not classic for pancreatic malignancy, there is no associated hypoenhancement in this region. Pancreas: No discrete pancreatic mass is identified. As noted above, the narrowed segment of the CBD in the vicinity of the pancreatic head may potentially have faintly associated accentuated enhancement. Spleen:  Unremarkable Adrenals/Urinary Tract: Right mid kidney Bosniak category 2 cyst anteriorly. Otherwise unremarkable. Adrenal glands normal. Stomach/Bowel: There is wall thickening in the gastric cardia with surrounding stranding and indistinctness of tissue planes. Jejunostomy tube noted. Vascular/Lymphatic: A porta hepatis node measures 1.6 cm in short axis on image 48/1102. Another porta hepatis node measures 1.2 cm in short axis on image 45/1102. Smaller periaortic lymph nodes are present and there are small lymph nodes around the distal esophagus and gastric cardia. Other:  No supplemental non-categorized findings. Musculoskeletal: Unremarkable IMPRESSION: 1. Marked narrowing in a 2.3 cm segment of the common bile duct corresponding with the segment of the duct along the pancreas. I do not see a definite pancreatic lesion but there is some faintly accentuated enhancement in the CBD in this vicinity. This could be from localized inflammation/cholangitis or a stricture. The CBD proximal to this stricture is 0.8 cm in diameter. Sludge noted in the gallbladder without definite stones. 2. Distal esophageal wall thickening and thickening in the proximal stomach. There are also postoperative findings in the stomach. Surrounding  inflammatory stranding is noted around the distal esophagus and proximal stomach, possibly therapy related. Surrounding small lymph nodes noted along with enlarged lymph nodes in the porta hepatis which are nonspecific and could be reactive  or malignant. 3. Scarring or atelectasis in the right lower lobe. Electronically Signed   By: Van Clines M.D.   On: 11/07/2018 19:06   Ir Int Lianne Cure Biliary Drain With Cholangiogram  Result Date: 11/09/2018 INDICATION: History of metastatic esophageal cancer, now with malignant biliary obstruction with associated jaundice and elevated bilirubin levels, presumably secondary to porta hepatis adenopathy. Patient with history of prior gastric bypass surgery and as such is not a candidate for attempted ERCP. Request made for placement of a percutaneous biliary drainage catheter for malignant biliary obstruction. EXAM: ULTRASOUND AND FLUOROSCOPIC GUIDED PERCUTANEOUS TRANSHEPATIC CHOLANGIOGRAM AND BILIARY TUBE PLACEMENT COMPARISON:  MRCP - 11/07/2018 MEDICATIONS: Vancomycin 1 gm IV; the antibiotic was administered within an appropriate time frame prior to the initiation of the procedure. CONTRAST:  20 cc Isovue-300, administered into biliary tree ANESTHESIA/SEDATION: Moderate (conscious) sedation was employed during this procedure. A total of Versed 4 mg, Dilaudid 3 mg IV and Fentanyl 50 mcg was administered intravenously. Moderate Sedation Time: 36 minutes. The patient's level of consciousness and vital signs were monitored continuously by radiology nursing throughout the procedure under my direct supervision. FLUOROSCOPY TIME:  6 minutes (287 mGy) COMPLICATIONS: None immediate. TECHNIQUE: Informed written consent was obtained from the patient and the patient's wife after a discussion of the risks, benefits and alternatives to treatment. Questions regarding the procedure were encouraged and answered. A timeout was performed prior to the initiation of the procedure. The right upper abdominal quadrant was prepped and draped in the usual sterile fashion, and a sterile drape was applied covering the operative field. Maximum barrier sterile technique with sterile gowns and gloves were used for the procedure. A  timeout was performed prior to the initiation of the procedure. Ultrasound scanning was performed of the midline of the upper abdomen demonstrated very minimal left-sided intrahepatic biliary ductal dilatation. After lying soft tissues were anesthetized with 1% lidocaine with epinephrine, two attempts were made access the left biliary tree under direct ultrasound fluoroscopic guided however this ultimately proved unsuccessful secondary to lack of significant left-sided intrahepatic biliary duct dilatation. As such, a spot along the right mid axillary line was marked fluoroscopically inferior to the right costophrenic angle. A peripheral very mildly dilated duct within the anterior aspect of the right lobe of the liver was targeted under direct ultrasound guidance after the overlying soft tissues were anesthetized with 1% lidocaine with epinephrine. Contrast injection demonstrates intra biliary puncture. The access bile duct was cannulated with a Nitrex wire which was advanced to the central aspect of the biliary tree. Tract was dilated with the inner 3 French catheter of the Accustick set. Contrast injection confirmed appropriate peripheral access. Next, a Nitrex wire was advanced to the level of the duodenum. The track was dilated with the remainder of the Selz set. A 4 French angled glide catheter was advanced through the outer sheath the Accustick set to the level of the duodenum. Contrast injection confirmed appropriate positioning Next, over a Amplatz wire, the tract was dilated and a 10.2 [place] biliary drainage catheter was advanced with coil ultimately locked within the duodenum. Contrast was injected and a completion radiograph was obtained. The catheter was connected to a drainage bag which yielded the brisk return of dark black colored bile. The catheter was secured to the skin with  an interrupted suture. The patient tolerated the procedure well without immediate postprocedural complication.  FINDINGS: Sonographic evaluation demonstrates very mild dilatation of the left intrahepatic biliary tree. Attempted though ultimately unsuccessful attempted left-sided biliary drainage catheter placement secondary lack of significant left-sided intrahepatic biliary ductal dilatation. Sonographic evaluation demonstrates mild dilatation of the right intrahepatic biliary tree which was ultimately successfully opacified and cannulated, allowing placement of a 10 French percutaneous biliary drainage catheter via the peripheral aspect of a very mildly dilated bile duct within the anterior segment of the right lobe of the liver with end coiled and locked within the duodenum. The radiopaque marker is located within the central aspect of the right anterior biliary tree. IMPRESSION: Successful placement of a 10.2 French percutaneous biliary drainage catheter with end coiled and locked within the duodenum. PLAN: - Biliary brush biopsy and formal cholangiogram could be performed following normalization of the patient's LFTs as clinically indicated. - Otherwise, fluoroscopic guided biliary stent placement could be performed in 4-6 weeks as indicated. Electronically Signed   By: Sandi Mariscal M.D.   On: 11/09/2018 17:18    Labs:  CBC: Recent Labs    11/07/18 1600 11/08/18 0500 11/09/18 0342 11/10/18 0309  WBC 7.4 7.2 8.3 11.4*  HGB 13.4 13.3 12.8* 13.3  HCT 46.3 45.3 42.8 44.4  PLT 273 270 234 235    COAGS: Recent Labs    10/11/18 1134 11/07/18 1600 11/08/18 0500  INR 1.08 1.11 1.16    BMP: Recent Labs    11/07/18 1600 11/08/18 0500 11/09/18 0342 11/10/18 0309  NA 137 139 139 139  K 4.1 4.0 3.6 3.6  CL 99 101 103 103  CO2 _0 GLUCOSE 326* 252* 173* 187*  BUN _1 CALCIUM 8.9 8.7* 8.7* 8.6*  CREATININE 0.55* 0.51* 0.49* 0.55*  GFRNONAA >60 >60 >60 >60  GFRAA >60 >60 >60 >60    LIVER FUNCTION TESTS: Recent Labs    11/07/18 1600 11/08/18 0500 11/09/18 0342  11/10/18 0309  BILITOT 7.6* 7.7* 8.9* 4.9*  AST 206* 171* 146* 86*  ALT 443* 373* 313* 266*  ALKPHOS 486* 454* 452* 413*  PROT 7.0 6.4* 6.3* 6.4*  ALBUMIN 3.2* 2.8* 2.8* 2.9*    Assessment and Plan: Pt with hx met esophageal ca with biliary obstruction; s/p I/E biliary drain 12/26; afebrile; WBC 11.4, hgb 13.3, creat 0.55, t bili 4.9(8.9), other LFT's down slightly; cont with drain irrigation tid, check bile cx/cytology; biliary brush bx/formal cholangiogram could be performed once LFT's normalized as clinically indicated, otherwise biliary stent placement could be performed in 4-6 weeks as indicated   Electronically Signed: D. Rowe Robert, [name] 11/10/2018, 9:48 AM   I spent a total of 15 minutes at the the patient's bedside AND on the patient's hospital floor or unit, greater than 50% of which was counseling/coordinating care for biliary drain    Patient ID: Darrell Sutton, male   DOB: 14-Feb-1961, 57 y.o.   MRN: [id number]

## 2018-11-10 NOTE — Progress Notes (Signed)
Once again, nothing to offer from a GI standpoint as Dr. Ardis Hughs noted yesterday.  Was seen by IR and PBD catheter placed.  Further management per IR.  Will check a lipase today due to complaints of some upper abdominal pain, but otherwise GI is planning to sign off.  Please feel free to call with any other questions.

## 2018-11-10 NOTE — Progress Notes (Signed)
Patient ID: Darrell Sutton, male   DOB: 07/31/1961, 57 y.o.   MRN: [id number]  PROGRESS NOTE    Darrell Sutton  BJS:[id number] DOB: 02-22-1961 DOA: 11/07/2018 PCP: [name], PA   Brief Narrative:   57 y.o. 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 [name] office with complaints of not feeling well for the last few days.  He was found to have abnormal LFTs, ultrasound of abdomen showed dilated common bile duct.  GI was consulted.  MRCP was ordered.  He was found to have CBD narrowing.  GI recommended IR evaluation for percutaneous biliary drainage.  He underwent percutaneous biliary drainage on 11/09/2018.   Assessment & Plan:   Principal Problem:   Abnormal LFTs Active Problems:   Obesity   Hypertension   Type 2 diabetes mellitus with complication, without long-term current use of insulin (HCC)   Cancer of lower third of esophagus (HCC)   Mechanical dysphagia from esophageal cancer   Jejunostomy tube present   Choledocholithiasis   Abnormal LFTs with elevated bilirubin  -MRCP showed CBD narrowing with concern for stricture/inflammation or cholangitis.  No evidence of fever or chills.  White count is normal.  Will not start antibiotics at this time until GI evaluation.  -GI following: Follow further recommendations regarding need for ERCP versus EUS -GI recommended IR evaluation for percutaneous biliary drainage.  He underwent percutaneous biliary drainage on 11/09/2018.  Will use IV pain meds for abdominal pain. -LFTs improving.  Repeat a.m. labs. -Start tube feedings today.  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   Outpatient follow-up with oncology -Restart tube feeds  Dehydration  -Decrease IV fluids to 50 cc an hour  Obesity -Outpatient follow-up  Hypertension -Blood pressure elevated.  Continue clonidine patch.  Will use IV antihypertensives PRN.  Diabetes mellitus type 2 uncontrolled with hyperglycemia -Continue home regimen.  CBGs with SSI   DVT prophylaxis: Lovenox Code Status: DNR Family Communication: None at bedside Disposition Plan: Home tomorrow if he remains stable Consultants: GI/oncology  Procedures: Percutaneous biliary drainage by IR on 11/09/2018  Antimicrobials: None   Subjective: Patient seen and examined at bedside.  Patient complains of some abdominal pain around the drain site.  No overnight fever, worsening vomiting.    Objective: Vitals:   11/09/18 1928 11/09/18 2005 11/10/18 0128 11/10/18 0503  BP: (!) 188/100 (!) 174/98 (!) 170/105 (!) 180/97  Pulse: 81 75 84 87  Resp: 18 17 15 16   Temp: (!) 97.5 F (36.4 C) 97.8 F (36.6 C) (!) 97.4 F (36.3 C) 98.6 F (37 C)  TempSrc: Oral Oral Oral Oral  SpO2: 99% 100% 97% 96%  Weight:      Height:        Intake/Output Summary (Last 24 hours) at 11/10/2018 0952 Last data filed at 11/10/2018 0911 Gross per 24 hour  Intake 1111.29 ml  Output 1150 ml  Net -38.71 ml   Filed Weights   11/07/18 1900  Weight: 130.4 kg    Examination:  General exam: No acute distress. Respiratory system: Bilateral decreased breath sounds at bases Cardiovascular system: S1-S2 heard, rate controlled Gastrointestinal system: Abdomen is obese, nondistended, soft and mildly tender around the percutaneous biliary drain site. Normal bowel sounds heard.  Jejunostomy tube in place Extremities:  No cyanosis, edema   Data Reviewed: I have personally reviewed following labs and imaging studies  CBC: Recent Labs  Lab 11/07/18 0923 11/07/18 1600 11/08/18 0500 11/09/18 0342 11/10/18 0309  WBC 8.0 7.4 7.2 8.3 11.4*    NEUTROABS 7.1  --   --  7.2 10.5*  HGB 14.1 13.4 13.3 12.8* 13.3  HCT 46.5 46.3 45.3 42.8 44.4  MCV 76.5* 80.4 78.9* 79.1* 79.7*  PLT 285 273 270 234 419   Basic Metabolic Panel: Recent Labs  Lab 11/07/18 0923 11/07/18 1600 11/08/18 0500 11/09/18 0342 11/10/18 0309  NA 136 137 139 139 139  K 4.1 4.1 4.0 3.6 3.6  CL 97* 99 101 103 103  CO2 29 27 28 26 25   GLUCOSE 389* 326* 252* 173* 187*  BUN 12 14 12 10 12   CREATININE 0.85 0.55* 0.51* 0.49* 0.55*  CALCIUM 9.5 8.9 8.7* 8.7* 8.6*  MG  --   --   --  1.9 1.9   GFR: Estimated Creatinine Clearance: 142.2 mL/min (A) (by C-G formula based on SCr of 0.55 mg/dL (L)). Liver Function Tests: Recent Labs  Lab 11/07/18 0923 11/07/18 1600 11/08/18 0500 11/09/18 0342 11/10/18 0309  AST 235* 206* 171* 146* 86*  ALT 503* 443* 373* 313* 266*  ALKPHOS 616* 486* 454* 452* 413*  BILITOT 8.0* 7.6* 7.7* 8.9* 4.9*  PROT 7.3 7.0 6.4* 6.3* 6.4*  ALBUMIN 3.0* 3.2* 2.8* 2.8* 2.9*   Recent Labs  Lab 11/07/18 1600 11/10/18 0309  LIPASE 26 286*   No results for input(s): AMMONIA in the last 168 hours. Coagulation Profile: Recent Labs  Lab 11/07/18 1600 11/08/18 0500  INR 1.11 1.16   Cardiac Enzymes: No results for input(s): CKTOTAL, CKMB, CKMBINDEX, TROPONINI in the last 168 hours. BNP (last 3 results) No results for input(s): PROBNP in the last 8760 hours. HbA1C: No results for input(s): HGBA1C in the last 72 hours. CBG: Recent Labs  Lab 11/09/18 0723 11/09/18 1148 11/09/18 1718 11/09/18 2116 11/10/18 0718  GLUCAP 202* 182* 255* 192* 209*   Lipid Profile: No results for input(s): CHOL, HDL, LDLCALC, TRIG, CHOLHDL, LDLDIRECT in the last 72 hours. Thyroid Function Tests: No results for input(s): TSH, T4TOTAL, FREET4, T3FREE, THYROIDAB in the last 72 hours. Anemia Panel: No results for input(s): VITAMINB12, FOLATE, FERRITIN, TIBC, IRON, RETICCTPCT in the last 72 hours. Sepsis Labs: No results for input(s): PROCALCITON,  LATICACIDVEN in the last 168 hours.  No results found for this or any previous visit (from the past 240 hour(s)).       Radiology Studies: Ir Int Lianne Cure Biliary Drain With Cholangiogram  Result Date: 11/09/2018 INDICATION: History of metastatic esophageal cancer, now with malignant biliary obstruction with associated jaundice and elevated bilirubin levels, presumably secondary to porta hepatis adenopathy. Patient with history of prior gastric bypass surgery and as such is not a candidate for attempted ERCP. Request made for placement of a percutaneous biliary drainage catheter for malignant biliary obstruction. EXAM: ULTRASOUND AND FLUOROSCOPIC GUIDED PERCUTANEOUS TRANSHEPATIC CHOLANGIOGRAM AND BILIARY TUBE PLACEMENT COMPARISON:  MRCP - 11/07/2018 MEDICATIONS: Vancomycin 1 gm IV; the antibiotic was administered within an appropriate time frame prior to the initiation of the procedure. CONTRAST:  20 cc Isovue-300, administered into biliary tree ANESTHESIA/SEDATION: Moderate (conscious) sedation was employed during this procedure. A total of Versed 4 mg, Dilaudid 3 mg IV and Fentanyl 50 mcg was administered intravenously. Moderate Sedation Time: 36 minutes. The patient's level of consciousness and vital signs were monitored continuously by radiology nursing throughout  the procedure under my direct supervision. FLUOROSCOPY TIME:  6 minutes (062 mGy) COMPLICATIONS: None immediate. TECHNIQUE: Informed written consent was obtained from the patient and the patient's wife after a discussion of the risks, benefits and alternatives to treatment. Questions regarding the procedure were encouraged and answered. A timeout was performed prior to the initiation of the procedure. The right upper abdominal quadrant was prepped and draped in the usual sterile fashion, and a sterile drape was applied covering the operative field. Maximum barrier sterile technique with sterile gowns and gloves were used for the procedure. A  timeout was performed prior to the initiation of the procedure. Ultrasound scanning was performed of the midline of the upper abdomen demonstrated very minimal left-sided intrahepatic biliary ductal dilatation. After lying soft tissues were anesthetized with 1% lidocaine with epinephrine, two attempts were made access the left biliary tree under direct ultrasound fluoroscopic guided however this ultimately proved unsuccessful secondary to lack of significant left-sided intrahepatic biliary duct dilatation. As such, a spot along the right mid axillary line was marked fluoroscopically inferior to the right costophrenic angle. A peripheral very mildly dilated duct within the anterior aspect of the right lobe of the liver was targeted under direct ultrasound guidance after the overlying soft tissues were anesthetized with 1% lidocaine with epinephrine. Contrast injection demonstrates intra biliary puncture. The access bile duct was cannulated with a Nitrex wire which was advanced to the central aspect of the biliary tree. Tract was dilated with the inner 3 French catheter of the [place] set. Contrast injection confirmed appropriate peripheral access. Next, a Nitrex wire was advanced to the level of the duodenum. The track was dilated with the remainder of the [place] set. A 4 French angled glide catheter was advanced through the outer sheath the [place] set to the level of the duodenum. Contrast injection confirmed appropriate positioning Next, over a Amplatz wire, the tract was dilated and a 10.2 [place] biliary drainage catheter was advanced with coil ultimately locked within the duodenum. Contrast was injected and a completion radiograph was obtained. The catheter was connected to a drainage bag which yielded the brisk return of dark black colored bile. The catheter was secured to the skin with an interrupted suture. The patient tolerated the procedure well without immediate postprocedural complication.  FINDINGS: Sonographic evaluation demonstrates very mild dilatation of the left intrahepatic biliary tree. Attempted though ultimately unsuccessful attempted left-sided biliary drainage catheter placement secondary lack of significant left-sided intrahepatic biliary ductal dilatation. Sonographic evaluation demonstrates mild dilatation of the right intrahepatic biliary tree which was ultimately successfully opacified and cannulated, allowing placement of a 10 French percutaneous biliary drainage catheter via the peripheral aspect of a very mildly dilated bile duct within the anterior segment of the right lobe of the liver with end coiled and locked within the duodenum. The radiopaque marker is located within the central aspect of the right anterior biliary tree. IMPRESSION: Successful placement of a 10.2 French percutaneous biliary drainage catheter with end coiled and locked within the duodenum. PLAN: - Biliary brush biopsy and formal cholangiogram could be performed following normalization of the patient's LFTs as clinically indicated. - Otherwise, fluoroscopic guided biliary stent placement could be performed in 4-6 weeks as indicated. Electronically Signed   By: Sandi Mariscal M.D.   On: 11/09/2018 17:18        Scheduled Meds: . cloNIDine  0.1 mg Transdermal Weekly  . insulin aspart  0-20 Units Subcutaneous TID WC  . insulin aspart  0-5 Units Subcutaneous QHS  .  insulin aspart protamine- aspart  7 Units Subcutaneous BID WC  . scopolamine  1 patch Transdermal Q72H   Continuous Infusions: . sodium chloride 75 mL/hr at 11/10/18 0130  . feeding supplement (OSMOLITE 1.5 CAL)       LOS: 2 days        Aline August, MD [facility] Pager [phone]  If 7PM-7AM, please contact night-coverage [email or web address] Password [facility] 11/10/2018, 9:52 AM

## 2018-11-10 NOTE — Progress Notes (Signed)
NATHANIAL ARRIGHI   DOB:[birth year]   QI#:[id number]   XLK#:[id number]  Oncology f/u   Subjective: Patient underwent percutaneous biliary draining tube placement yesterday.  He tolerated procedure well, still has soreness in the right upper quadrant draining site.  His bilirubin is coming down, afebrile, no other new complaints.  Objective:  Vitals:   11/10/18 0503 11/10/18 1317  BP: (!) 180/97 (!) 164/85  Pulse: 87 91  Resp: 16 17  Temp: 98.6 F (37 C) 97.8 F (36.6 C)  SpO2: 96% 92%    Body mass index is 38.98 kg/m.  Intake/Output Summary (Last 24 hours) at 11/10/2018 1752 Last data filed at 11/10/2018 1739 Gross per 24 hour  Intake 1676 ml  Output 1950 ml  Net -274 ml     Sclerae icteric  Oropharynx clear  No peripheral adenopathy  Lungs clear -- no rales or rhonchi  Heart regular rate and rhythm  Abdomen benign  MSK no focal spinal tenderness, no peripheral edema  Neuro nonfocal    CBG (last 3)  Recent Labs    11/09/18 1718 11/09/18 2116 11/10/18 0718  GLUCAP 255* 192* 209*     Labs:  Lab Results  Component Value Date   WBC 11.4 (H) 11/10/2018   HGB 13.3 11/10/2018   HCT 44.4 11/10/2018   MCV 79.7 (L) 11/10/2018   PLT 235 11/10/2018   NEUTROABS 10.5 (H) 11/10/2018    Urine Studies No results for input(s): UHGB, CRYS in the last 72 hours.  Invalid input(s): UACOL, UAPR, USPG, UPH, UTP, UGL, UKET, UBIL, UNIT, UROB, [place], UEPI, UWBC, Duwayne Heck [place], [place]  Basic Metabolic Panel: Recent Labs  Lab 11/07/18 0923 11/07/18 1600 11/08/18 0500 11/09/18 0342 11/10/18 0309  NA 136 137 139 139 139  K 4.1 4.1 4.0 3.6 3.6  CL 97* 99 101 103 103  CO2 29 27 28 26 25   GLUCOSE 389* 326* 252* 173* 187*  BUN 12 14 12 10 12   CREATININE 0.85 0.55* 0.51* 0.49* 0.55*  CALCIUM 9.5 8.9 8.7* 8.7* 8.6*  MG  --   --   --  1.9 1.9   GFR Estimated Creatinine Clearance: 142.2 mL/min (A) (by C-G formula based on SCr of 0.55 mg/dL (L)). Liver Function  Tests: Recent Labs  Lab 11/07/18 0923 11/07/18 1600 11/08/18 0500 11/09/18 0342 11/10/18 0309  AST 235* 206* 171* 146* 86*  ALT 503* 443* 373* 313* 266*  ALKPHOS 616* 486* 454* 452* 413*  BILITOT 8.0* 7.6* 7.7* 8.9* 4.9*  PROT 7.3 7.0 6.4* 6.3* 6.4*  ALBUMIN 3.0* 3.2* 2.8* 2.8* 2.9*   Recent Labs  Lab 11/07/18 1600 11/10/18 0309  LIPASE 26 286*   No results for input(s): AMMONIA in the last 168 hours. Coagulation profile Recent Labs  Lab 11/07/18 1600 11/08/18 0500  INR 1.11 1.16    CBC: Recent Labs  Lab 11/07/18 0923 11/07/18 1600 11/08/18 0500 11/09/18 0342 11/10/18 0309  WBC 8.0 7.4 7.2 8.3 11.4*  NEUTROABS 7.1  --   --  7.2 10.5*  HGB 14.1 13.4 13.3 12.8* 13.3  HCT 46.5 46.3 45.3 42.8 44.4  MCV 76.5* 80.4 78.9* 79.1* 79.7*  PLT 285 273 270 234 235   Cardiac Enzymes: No results for input(s): CKTOTAL, CKMB, CKMBINDEX, TROPONINI in the last 168 hours. BNP: Invalid input(s): POCBNP CBG: Recent Labs  Lab 11/09/18 0723 11/09/18 1148 11/09/18 1718 11/09/18 2116 11/10/18 0718  GLUCAP 202* 182* 255* 192* 209*   D-Dimer No results for input(s): DDIMER in the  last 72 hours. Hgb A1c No results for input(s): HGBA1C in the last 72 hours. Lipid Profile No results for input(s): CHOL, HDL, LDLCALC, TRIG, CHOLHDL, LDLDIRECT in the last 72 hours. Thyroid function studies No results for input(s): TSH, T4TOTAL, T3FREE, THYROIDAB in the last 72 hours.  Invalid input(s): FREET3 Anemia work up No results for input(s): VITAMINB12, FOLATE, FERRITIN, TIBC, IRON, RETICCTPCT in the last 72 hours. Microbiology No results found for this or any previous visit (from the past 240 hour(s)).    Studies:  Ir Int Lianne Cure Biliary Drain With Cholangiogram  Result Date: 11/09/2018 INDICATION: History of metastatic esophageal cancer, now with malignant biliary obstruction with associated jaundice and elevated bilirubin levels, presumably secondary to porta hepatis adenopathy.  Patient with history of prior gastric bypass surgery and as such is not a candidate for attempted ERCP. Request made for placement of a percutaneous biliary drainage catheter for malignant biliary obstruction. EXAM: ULTRASOUND AND FLUOROSCOPIC GUIDED PERCUTANEOUS TRANSHEPATIC CHOLANGIOGRAM AND BILIARY TUBE PLACEMENT COMPARISON:  MRCP - 11/07/2018 MEDICATIONS: Vancomycin 1 gm IV; the antibiotic was administered within an appropriate time frame prior to the initiation of the procedure. CONTRAST:  20 cc Isovue-300, administered into biliary tree ANESTHESIA/SEDATION: Moderate (conscious) sedation was employed during this procedure. A total of Versed 4 mg, Dilaudid 3 mg IV and Fentanyl 50 mcg was administered intravenously. Moderate Sedation Time: 36 minutes. The patient's level of consciousness and vital signs were monitored continuously by radiology nursing throughout the procedure under my direct supervision. FLUOROSCOPY TIME:  6 minutes (194 mGy) COMPLICATIONS: None immediate. TECHNIQUE: Informed written consent was obtained from the patient and the patient's wife after a discussion of the risks, benefits and alternatives to treatment. Questions regarding the procedure were encouraged and answered. A timeout was performed prior to the initiation of the procedure. The right upper abdominal quadrant was prepped and draped in the usual sterile fashion, and a sterile drape was applied covering the operative field. Maximum barrier sterile technique with sterile gowns and gloves were used for the procedure. A timeout was performed prior to the initiation of the procedure. Ultrasound scanning was performed of the midline of the upper abdomen demonstrated very minimal left-sided intrahepatic biliary ductal dilatation. After lying soft tissues were anesthetized with 1% lidocaine with epinephrine, two attempts were made access the left biliary tree under direct ultrasound fluoroscopic guided however this ultimately proved  unsuccessful secondary to lack of significant left-sided intrahepatic biliary duct dilatation. As such, a spot along the right mid axillary line was marked fluoroscopically inferior to the right costophrenic angle. A peripheral very mildly dilated duct within the anterior aspect of the right lobe of the liver was targeted under direct ultrasound guidance after the overlying soft tissues were anesthetized with 1% lidocaine with epinephrine. Contrast injection demonstrates intra biliary puncture. The access bile duct was cannulated with a Nitrex wire which was advanced to the central aspect of the biliary tree. Tract was dilated with the inner 3 French catheter of the Accustick set. Contrast injection confirmed appropriate peripheral access. Next, a Nitrex wire was advanced to the level of the duodenum. The track was dilated with the remainder of the [place] set. A 4 French angled glide catheter was advanced through the outer sheath the Accustick set to the level of the duodenum. Contrast injection confirmed appropriate positioning Next, over a Amplatz wire, the tract was dilated and a 10.2 [place] biliary drainage catheter was advanced with coil ultimately locked within the duodenum. Contrast was injected and a completion radiograph  was obtained. The catheter was connected to a drainage bag which yielded the brisk return of dark black colored bile. The catheter was secured to the skin with an interrupted suture. The patient tolerated the procedure well without immediate postprocedural complication. FINDINGS: Sonographic evaluation demonstrates very mild dilatation of the left intrahepatic biliary tree. Attempted though ultimately unsuccessful attempted left-sided biliary drainage catheter placement secondary lack of significant left-sided intrahepatic biliary ductal dilatation. Sonographic evaluation demonstrates mild dilatation of the right intrahepatic biliary tree which was ultimately successfully opacified and  cannulated, allowing placement of a 10 French percutaneous biliary drainage catheter via the peripheral aspect of a very mildly dilated bile duct within the anterior segment of the right lobe of the liver with end coiled and locked within the duodenum. The radiopaque marker is located within the central aspect of the right anterior biliary tree. IMPRESSION: Successful placement of a 10.2 French percutaneous biliary drainage catheter with end coiled and locked within the duodenum. PLAN: - Biliary brush biopsy and formal cholangiogram could be performed following normalization of the patient's LFTs as clinically indicated. - Otherwise, fluoroscopic guided biliary stent placement could be performed in 4-6 weeks as indicated. Electronically Signed   By: Sandi Mariscal M.D.   On: 11/09/2018 17:18    Assessment: 57 y.o. 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  Obstructive jaundice, unclear etiology, post biliary drain placement 12/26 2.  Metastatic esophageal adenocarcinoma, completed palliative radiation yesterday. 3.  Dysphagia and odynophagia, secondary to #2 and radiation, S/P jejunostomy tube placement for tube feeds  4. DM 5. HTN  6. CAD   Plan:  -His percutaneous biliary drainage is working well, bilirubin is coming down. I have talked to IR Drs Barbie Banner and Pascal Lux, they plan to try bile duct brush and stent placement in 3-4 weeks.  -continue supportive care  -I will set up his outpt f/u. Will hold on starting systemic chemo for the next few weeks.   Truitt Merle, MD 11/10/2018  5:52 PM

## 2018-11-11 LAB — CBC WITH DIFFERENTIAL/PLATELET
Abs Immature Granulocytes: 0.03 10*3/uL (ref 0.00–0.07)
Basophils Absolute: 0 10*3/uL (ref 0.0–0.1)
Basophils Relative: 0 %
Eosinophils Absolute: 0.1 10*3/uL (ref 0.0–0.5)
Eosinophils Relative: 1 %
HEMATOCRIT: 43.7 % (ref 39.0–52.0)
Hemoglobin: 13 g/dL (ref 13.0–17.0)
Immature Granulocytes: 0 %
Lymphocytes Relative: 1 %
Lymphs Abs: 0.1 10*3/uL — ABNORMAL LOW (ref 0.7–4.0)
MCH: 23.5 pg — ABNORMAL LOW (ref 26.0–34.0)
MCHC: 29.7 g/dL — ABNORMAL LOW (ref 30.0–36.0)
MCV: 79 fL — ABNORMAL LOW (ref 80.0–100.0)
Monocytes Absolute: 0.6 10*3/uL (ref 0.1–1.0)
Monocytes Relative: 6 %
Neutro Abs: 9 10*3/uL — ABNORMAL HIGH (ref 1.7–7.7)
Neutrophils Relative %: 92 %
Platelets: 233 10*3/uL (ref 150–400)
RBC: 5.53 MIL/uL (ref 4.22–5.81)
RDW: 20.4 % — AB (ref 11.5–15.5)
WBC: 9.9 10*3/uL (ref 4.0–10.5)
nRBC: 0 % (ref 0.0–0.2)

## 2018-11-11 LAB — COMPREHENSIVE METABOLIC PANEL
ALT: 192 U/L — ABNORMAL HIGH (ref 0–44)
AST: 47 U/L — ABNORMAL HIGH (ref 15–41)
Albumin: 2.8 g/dL — ABNORMAL LOW (ref 3.5–5.0)
Alkaline Phosphatase: 344 U/L — ABNORMAL HIGH (ref 38–126)
Anion gap: 8 (ref 5–15)
BUN: 12 mg/dL (ref 6–20)
CO2: 28 mmol/L (ref 22–32)
Calcium: 8.7 mg/dL — ABNORMAL LOW (ref 8.9–10.3)
Chloride: 103 mmol/L (ref 98–111)
Creatinine, Ser: 0.56 mg/dL — ABNORMAL LOW (ref 0.61–1.24)
GFR calc Af Amer: >60 mL/min (ref >60)
GFR calc non Af Amer: >60 mL/min (ref >60)
Glucose, Bld: 259 mg/dL — ABNORMAL HIGH (ref 70–99)
Potassium: 3.5 mmol/L (ref 3.5–5.1)
Sodium: 139 mmol/L (ref 135–145)
Total Bilirubin: 3.5 mg/dL — ABNORMAL HIGH (ref 0.3–1.2)
Total Protein: 6.6 g/dL (ref 6.5–8.1)

## 2018-11-11 LAB — MAGNESIUM: Magnesium: 2.1 mg/dL (ref 1.7–2.4)

## 2018-11-11 LAB — GLUCOSE, CAPILLARY
GLUCOSE-CAPILLARY: 250 mg/dL — AB (ref 70–99)
GLUCOSE-CAPILLARY: 245 mg/dL — AB (ref 70–99)
Glucose-Capillary: 195 mg/dL — ABNORMAL HIGH (ref 70–99)
Glucose-Capillary: 263 mg/dL — ABNORMAL HIGH (ref 70–99)

## 2018-11-11 MED ORDER — HYDROCODONE-ACETAMINOPHEN 7.5-325 MG/15ML PO SOLN: 15.0000 mL | Freq: Four times a day (QID) | ORAL | 0 refills | Status: DC | PRN

## 2018-11-11 MED ORDER — OSMOLITE 1.5 CAL PO LIQD: 1000.0000 mL | ORAL | 0 refills | Status: DC

## 2018-11-11 MED ORDER — POLYETHYLENE GLYCOL 3350 17 G PO PACK: 17.0000 g | PACK | Freq: Every day | ORAL | 0 refills | Status: AC | PRN

## 2018-11-11 MED ORDER — GLUCERNA 1.5 CAL PO LIQD: 1000.0000 mL | ORAL | 30 refills | Status: AC

## 2018-11-11 MED ORDER — HEPARIN SOD (PORK) LOCK FLUSH 100 UNIT/ML IV SOLN
500.0000 [IU] | INTRAVENOUS | Status: AC | PRN
  Administered 2018-11-11: 500 [IU]

## 2018-11-11 MED ORDER — BISACODYL 10 MG RE SUPP: 10.0000 mg | RECTAL | 0 refills | Status: AC | PRN

## 2018-11-11 NOTE — Progress Notes (Signed)
Referring Physician(s): Alekh,K  Supervising Physician: Dr. Markus Daft  Patient Status:  [facility] - In-pt  Chief Complaint:  Metastatic esophageal cancer, biliary obstruction  Subjective: Mild RUQ soreness   Allergies: Penicillins  Medications:  Current Facility-Administered Medications:  .  0.9 %  sodium chloride infusion, , Intravenous, Continuous, Alekh, Kshitiz, MD, Last Rate: 50 mL/hr at 11/10/18 1800 .  cloNIDine (CATAPRES - Dosed in mg/24 hr) patch 0.1 mg, 0.1 mg, Transdermal, Weekly, Alekh, Kshitiz, MD, 0.1 mg at 11/09/18 1837 .  feeding supplement (OSMOLITE 1.5 CAL) liquid 1,000 mL, 1,000 mL, Per Tube, Continuous, Alekh, Kshitiz, MD, Last Rate: 40 mL/hr at 11/11/18 0830, 1,000 mL at 11/11/18 0830 .  hydrALAZINE (APRESOLINE) injection 5 mg, 5 mg, Intravenous, Q6H PRN, Starla Link, Kshitiz, MD, 5 mg at 11/10/18 0515 .  HYDROmorphone (DILAUDID) injection 0.5 mg, 0.5 mg, Intravenous, Q3H PRN, Starla Link, Kshitiz, MD, 0.5 mg at 11/11/18 0830 .  insulin aspart (novoLOG) injection 0-20 Units, 0-20 Units, Subcutaneous, TID WC, Starla Link, Kshitiz, MD, 7 Units at 11/11/18 [phone] .  insulin aspart (novoLOG) injection 0-5 Units, 0-5 Units, Subcutaneous, QHS, Alekh, Kshitiz, MD, 2 Units at 11/07/18 2200 .  insulin aspart protamine- aspart (NOVOLOG MIX 70/30) injection 7 Units, 7 Units, Subcutaneous, BID WC, Starla Link, Kshitiz, MD, 7 Units at 11/11/18 0813 .  iopamidol (ISOVUE-300) 61 % injection 50 mL, 50 mL, Other, Once PRN, Sandi Mariscal, MD .  LORazepam (ATIVAN) tablet 0.5 mg, 0.5 mg, Sublingual, Q6H PRN, Starla Link, Kshitiz, MD .  metoprolol tartrate (LOPRESSOR) injection 2.5 mg, 2.5 mg, Intravenous, Q6H PRN, Starla Link, Kshitiz, MD, 2.5 mg at 11/11/18 0033 .  ondansetron (ZOFRAN) tablet 4 mg, 4 mg, Oral, Q6H PRN **OR** ondansetron (ZOFRAN) injection 4 mg, 4 mg, Intravenous, Q6H PRN, Starla Link, Kshitiz, MD, 4 mg at 11/11/18 0238 .  polyethylene glycol (MIRALAX / GLYCOLAX) packet 17 g, 17 g, Per J Tube, Daily PRN, Alekh,  Kshitiz, MD .  scopolamine (TRANSDERM-SCOP) 1 MG/3DAYS 1.5 mg, 1 patch, Transdermal, Q72H, Alekh, Kshitiz, MD, 1.5 mg at 11/10/18 0932 .  sodium chloride flush (NS) 0.9 % injection 10-40 mL, 10-40 mL, Intracatheter, PRN, Starla Link, Kshitiz, MD    Vital Signs: BP (!) 164/87 (BP Location: Left Arm)   Pulse 79   Temp 98.1 F (36.7 C) (Oral)   Resp 16   Ht 6' (1.829 m)   Wt 131.4 kg   SpO2 96%   BMI 39.29 kg/m   Physical Exam awake/alert; biliary drain intact, dressing dry, site mildly tender,  output green bile  Imaging: US Abdomen Complete  Result Date: 11/07/2018 CLINICAL DATA:  Jaundice, rule out biliary obstruction EXAM: ABDOMEN ULTRASOUND COMPLETE COMPARISON:  CT 08/22/2018 FINDINGS: Gallbladder: Gallbladder is distended to 6 cm in diameter. There is sludge within the gallbladder. No echogenic gallstones. Negative sonographic Murphy's sign. No pericholecystic fluid. Significant gallbladder wall thickening. Common bile duct: Diameter: Dilated 9 mm. Liver: No focal lesion identified. Within normal limits in parenchymal echogenicity. Portal vein is patent on color Doppler imaging with normal direction of blood flow towards the liver. IVC: No abnormality visualized. Pancreas: Visualized portion unremarkable. Spleen: Size and appearance within normal limits. Right Kidney: Length: 12.6 cm. Cystic lesion with thin septation is coming anechoic measures 2.3 cm. This corresponds to low-density cyst on comparison CT. Left Kidney: Length: 14.3 cm.  There is Abdominal aorta: No aneurysm visualized. Other findings: None. IMPRESSION: 1. Dilatation of the common bile duct and gallbladder without obstructing lesion identified. No gallstones are evident but there is sludge within  the lumen gallbladder. Consider contrast MRCP to further evaluate etiology of potential distal biliary obstruction. 2. No intrahepatic biliary duct dilatation identified. Electronically Signed   By: Suzy Bouchard M.D.   On:  11/07/2018 14:52   Mr 3d Recon At Scanner  Result Date: 11/07/2018 CLINICAL DATA:  Jaundice. Elevated liver function tests. History of esophageal cancer. EXAM: MRI ABDOMEN WITHOUT AND WITH CONTRAST (INCLUDING MRCP) TECHNIQUE: Multiplanar multisequence MR imaging of the abdomen was performed both before and after the administration of intravenous contrast. Heavily T2-weighted images of the biliary and pancreatic ducts were obtained, and three-dimensional MRCP images were rendered by post processing. CONTRAST:  10 cc Gadavist COMPARISON:  Multiple exams, including ultrasound 11/07/2018 and PET-CT from 09/29/2018 FINDINGS: Lower chest: Distal esophageal wall thickening with surrounding edema noted in this patient with history of esophageal mass. There is scarring or atelectasis in the right lower lobe. Hepatobiliary: Sludge in the gallbladder. Mild intrahepatic biliary dilatation with abrupt tapered/beaked appearance of the common bile duct approximately corresponding to its course along the pancreatic head. No well-defined filling defect. The common hepatic duct measures about 8 mm in diameter proximal to this area of narrowing. In the area of narrowing there is subtly accentuated enhancement in the expected vicinity of the dorsal pancreatic duct. The appearance is not classic for pancreatic malignancy, there is no associated hypoenhancement in this region. Pancreas: No discrete pancreatic mass is identified. As noted above, the narrowed segment of the CBD in the vicinity of the pancreatic head may potentially have faintly associated accentuated enhancement. Spleen:  Unremarkable Adrenals/Urinary Tract: Right mid kidney Bosniak category 2 cyst anteriorly. Otherwise unremarkable. Adrenal glands normal. Stomach/Bowel: There is wall thickening in the gastric cardia with surrounding stranding and indistinctness of tissue planes. Jejunostomy tube noted. Vascular/Lymphatic: A porta hepatis node measures 1.6 cm in short  axis on image 48/1102. Another porta hepatis node measures 1.2 cm in short axis on image 45/1102. Smaller periaortic lymph nodes are present and there are small lymph nodes around the distal esophagus and gastric cardia. Other:  No supplemental non-categorized findings. Musculoskeletal: Unremarkable IMPRESSION: 1. Marked narrowing in a 2.3 cm segment of the common bile duct corresponding with the segment of the duct along the pancreas. I do not see a definite pancreatic lesion but there is some faintly accentuated enhancement in the CBD in this vicinity. This could be from localized inflammation/cholangitis or a stricture. The CBD proximal to this stricture is 0.8 cm in diameter. Sludge noted in the gallbladder without definite stones. 2. Distal esophageal wall thickening and thickening in the proximal stomach. There are also postoperative findings in the stomach. Surrounding inflammatory stranding is noted around the distal esophagus and proximal stomach, possibly therapy related. Surrounding small lymph nodes noted along with enlarged lymph nodes in the porta hepatis which are nonspecific and could be reactive or malignant. 3. Scarring or atelectasis in the right lower lobe. Electronically Signed   By: Van Clines M.D.   On: 11/07/2018 19:06   Mr Abdomen Mrcp Moise Boring Contast  Result Date: 11/07/2018 CLINICAL DATA:  Jaundice. Elevated liver function tests. History of esophageal cancer. EXAM: MRI ABDOMEN WITHOUT AND WITH CONTRAST (INCLUDING MRCP) TECHNIQUE: Multiplanar multisequence MR imaging of the abdomen was performed both before and after the administration of intravenous contrast. Heavily T2-weighted images of the biliary and pancreatic ducts were obtained, and three-dimensional MRCP images were rendered by post processing. CONTRAST:  10 cc Gadavist COMPARISON:  Multiple exams, including ultrasound 11/07/2018 and PET-CT from 09/29/2018 FINDINGS:  Lower chest: Distal esophageal wall thickening with  surrounding edema noted in this patient with history of esophageal mass. There is scarring or atelectasis in the right lower lobe. Hepatobiliary: Sludge in the gallbladder. Mild intrahepatic biliary dilatation with abrupt tapered/beaked appearance of the common bile duct approximately corresponding to its course along the pancreatic head. No well-defined filling defect. The common hepatic duct measures about 8 mm in diameter proximal to this area of narrowing. In the area of narrowing there is subtly accentuated enhancement in the expected vicinity of the dorsal pancreatic duct. The appearance is not classic for pancreatic malignancy, there is no associated hypoenhancement in this region. Pancreas: No discrete pancreatic mass is identified. As noted above, the narrowed segment of the CBD in the vicinity of the pancreatic head may potentially have faintly associated accentuated enhancement. Spleen:  Unremarkable Adrenals/Urinary Tract: Right mid kidney Bosniak category 2 cyst anteriorly. Otherwise unremarkable. Adrenal glands normal. Stomach/Bowel: There is wall thickening in the gastric cardia with surrounding stranding and indistinctness of tissue planes. Jejunostomy tube noted. Vascular/Lymphatic: A porta hepatis node measures 1.6 cm in short axis on image 48/1102. Another porta hepatis node measures 1.2 cm in short axis on image 45/1102. Smaller periaortic lymph nodes are present and there are small lymph nodes around the distal esophagus and gastric cardia. Other:  No supplemental non-categorized findings. Musculoskeletal: Unremarkable IMPRESSION: 1. Marked narrowing in a 2.3 cm segment of the common bile duct corresponding with the segment of the duct along the pancreas. I do not see a definite pancreatic lesion but there is some faintly accentuated enhancement in the CBD in this vicinity. This could be from localized inflammation/cholangitis or a stricture. The CBD proximal to this stricture is 0.8 cm in  diameter. Sludge noted in the gallbladder without definite stones. 2. Distal esophageal wall thickening and thickening in the proximal stomach. There are also postoperative findings in the stomach. Surrounding inflammatory stranding is noted around the distal esophagus and proximal stomach, possibly therapy related. Surrounding small lymph nodes noted along with enlarged lymph nodes in the porta hepatis which are nonspecific and could be reactive or malignant. 3. Scarring or atelectasis in the right lower lobe. Electronically Signed   By: Van Clines M.D.   On: 11/07/2018 19:06   Ir Int Lianne Cure Biliary Drain With Cholangiogram  Result Date: 11/09/2018 INDICATION: History of metastatic esophageal cancer, now with malignant biliary obstruction with associated jaundice and elevated bilirubin levels, presumably secondary to porta hepatis adenopathy. Patient with history of prior gastric bypass surgery and as such is not a candidate for attempted ERCP. Request made for placement of a percutaneous biliary drainage catheter for malignant biliary obstruction. EXAM: ULTRASOUND AND FLUOROSCOPIC GUIDED PERCUTANEOUS TRANSHEPATIC CHOLANGIOGRAM AND BILIARY TUBE PLACEMENT COMPARISON:  MRCP - 11/07/2018 MEDICATIONS: Vancomycin 1 gm IV; the antibiotic was administered within an appropriate time frame prior to the initiation of the procedure. CONTRAST:  20 cc Isovue-300, administered into biliary tree ANESTHESIA/SEDATION: Moderate (conscious) sedation was employed during this procedure. A total of Versed 4 mg, Dilaudid 3 mg IV and Fentanyl 50 mcg was administered intravenously. Moderate Sedation Time: 36 minutes. The patient's level of consciousness and vital signs were monitored continuously by radiology nursing throughout the procedure under my direct supervision. FLUOROSCOPY TIME:  6 minutes (315 mGy) COMPLICATIONS: None immediate. TECHNIQUE: Informed written consent was obtained from the patient and the patient's wife  after a discussion of the risks, benefits and alternatives to treatment. Questions regarding the procedure were encouraged and answered. A timeout  was performed prior to the initiation of the procedure. The right upper abdominal quadrant was prepped and draped in the usual sterile fashion, and a sterile drape was applied covering the operative field. Maximum barrier sterile technique with sterile gowns and gloves were used for the procedure. A timeout was performed prior to the initiation of the procedure. Ultrasound scanning was performed of the midline of the upper abdomen demonstrated very minimal left-sided intrahepatic biliary ductal dilatation. After lying soft tissues were anesthetized with 1% lidocaine with epinephrine, two attempts were made access the left biliary tree under direct ultrasound fluoroscopic guided however this ultimately proved unsuccessful secondary to lack of significant left-sided intrahepatic biliary duct dilatation. As such, a spot along the right mid axillary line was marked fluoroscopically inferior to the right costophrenic angle. A peripheral very mildly dilated duct within the anterior aspect of the right lobe of the liver was targeted under direct ultrasound guidance after the overlying soft tissues were anesthetized with 1% lidocaine with epinephrine. Contrast injection demonstrates intra biliary puncture. The access bile duct was cannulated with a Nitrex wire which was advanced to the central aspect of the biliary tree. Tract was dilated with the inner 3 French catheter of the [place] set. Contrast injection confirmed appropriate peripheral access. Next, a Nitrex wire was advanced to the level of the duodenum. The track was dilated with the remainder of the [place] set. A 4 French angled glide catheter was advanced through the outer sheath the [place] set to the level of the duodenum. Contrast injection confirmed appropriate positioning Next, over a Amplatz wire, the tract  was dilated and a 10.2 [place] biliary drainage catheter was advanced with coil ultimately locked within the duodenum. Contrast was injected and a completion radiograph was obtained. The catheter was connected to a drainage bag which yielded the brisk return of dark black colored bile. The catheter was secured to the skin with an interrupted suture. The patient tolerated the procedure well without immediate postprocedural complication. FINDINGS: Sonographic evaluation demonstrates very mild dilatation of the left intrahepatic biliary tree. Attempted though ultimately unsuccessful attempted left-sided biliary drainage catheter placement secondary lack of significant left-sided intrahepatic biliary ductal dilatation. Sonographic evaluation demonstrates mild dilatation of the right intrahepatic biliary tree which was ultimately successfully opacified and cannulated, allowing placement of a 10 French percutaneous biliary drainage catheter via the peripheral aspect of a very mildly dilated bile duct within the anterior segment of the right lobe of the liver with end coiled and locked within the duodenum. The radiopaque marker is located within the central aspect of the right anterior biliary tree. IMPRESSION: Successful placement of a 10.2 French percutaneous biliary drainage catheter with end coiled and locked within the duodenum. PLAN: - Biliary brush biopsy and formal cholangiogram could be performed following normalization of the patient's LFTs as clinically indicated. - Otherwise, fluoroscopic guided biliary stent placement could be performed in 4-6 weeks as indicated. Electronically Signed   By: Sandi Mariscal M.D.   On: 11/09/2018 17:18    Labs:  CBC: Recent Labs    11/08/18 0500 11/09/18 0342 11/10/18 0309 11/11/18 0340  WBC 7.2 8.3 11.4* 9.9  HGB 13.3 12.8* 13.3 13.0  HCT 45.3 42.8 44.4 43.7  PLT 270 234 235 233    COAGS: Recent Labs    10/11/18 1134 11/07/18 1600 11/08/18 0500  INR 1.08 1.11  1.16    BMP: Recent Labs    11/08/18 0500 11/09/18 0342 11/10/18 0309 11/11/18 0340  NA 139 139 139 139  K 4.0 3.6 3.6 3.5  CL 101 103 103 103  CO2 _0 GLUCOSE 252* 173* 187* 259*  BUN _1 CALCIUM 8.7* 8.7* 8.6* 8.7*  CREATININE 0.51* 0.49* 0.55* 0.56*  GFRNONAA >60 >60 >60 >60  GFRAA >60 >60 >60 >60    LIVER FUNCTION TESTS: Recent Labs    11/08/18 0500 11/09/18 0342 11/10/18 0309 11/11/18 0340  BILITOT 7.7* 8.9* 4.9* 3.5*  AST 171* 146* 86* 47*  ALT 373* 313* 266* 192*  ALKPHOS 454* 452* 413* 344*  PROT 6.4* 6.3* 6.4* 6.6  ALBUMIN 2.8* 2.8* 2.9* 2.8*    Assessment and Plan: Pt with hx met esophageal ca with biliary obstruction; s/p I/E biliary drain 12/26; Bili down to 3.5 biliary brush bx/formal cholangiogram could be performed once LFT's normalized as clinically indicated, otherwise biliary stent placement could be performed in 4-6 weeks as indicated. Pt states he is planned for discharge today   Electronically Signed: Ascencion Dike, [name] 11/11/2018, 9:12 AM   I spent a total of 15 minutes at the the patient's bedside AND on the patient's hospital floor or unit, greater than 50% of which was counseling/coordinating care for biliary drain

## 2018-11-11 NOTE — Discharge Summary (Addendum)
Physician Discharge Summary  Darrell GOVER KWI:[id number] DOB: 08-18-1961 DOA: 11/07/2018  PCP: Darrell Coke, PA  Admit date: 11/07/2018 Discharge date: 11/11/2018  Admitted From: Home  disposition: Home  Recommendations for Outpatient Follow-up:  1. Follow up with PCP in 1weeks with repeat CBC/CMP 2. Follow-up with Dr. Burr Sutton next week 3. Follow-up with IR for biliary drain care 4. 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 Ernestina Sutton office with complaints of not feeling well for the last few days.  He was found to have abnormal LFTs, ultrasound of abdomen showed dilated common bile duct.  GI was consulted.  MRCP was ordered.  He was found to have CBD narrowing.  GI recommended IR evaluation for percutaneous biliary drainage.  He underwent percutaneous biliary drainage on 11/09/2018.  His LFTs have improved.  He is hemodynamically stable.  He is tolerating tube feedings.  He will be discharged home with outpatient follow-up with oncology and IR.  Discharge Diagnoses:  Principal Problem:   Abnormal LFTs Active Problems:   Obesity   Hypertension   Type 2 diabetes mellitus with complication, without long-term current use of insulin (HCC)   Cancer of lower third of esophagus (HCC)   Mechanical dysphagia from esophageal cancer   Jejunostomy tube present   Choledocholithiasis  Abnormal LFTs with elevated bilirubin  -MRCP showed CBD narrowing  with concern for stricture/inflammation or cholangitis.  No evidence of fever or chills. White count is normal.  Was not treated with intravenous antibiotics. -GI evaluated the patient and recommended IR guided percutaneous drainage - He underwent percutaneous biliary drainage on 11/09/2018.    Has some pain around the drain site.  Will use elixir form of pain medication on discharge -LFTs improving.    Tolerating tube feeds.  Discharge home with outpatient follow-up of CMP. -Outpatient follow-up with IR for percutaneous biliary drain care.  Metastatic esophageal adenocarcinoma stage IVwith dysphagia -Oncology follow-up appreciated.  Outpatient follow-up with oncology -Tolerating tube feedings  Dehydration -Treated with IV fluids  Obesity -Outpatient follow-up  Hypertension -Blood pressure elevated.  Continue clonidine patch.    Outpatient follow-up  Diabetes mellitus type 2 uncontrolled with hyperglycemia -Continue home regimen.  Discharge Instructions  Discharge Instructions    Ambulatory referral to Interventional Radiology   Complete by:  As directed    Percutaneous biliary drain care   Call MD for:  difficulty breathing, headache or visual disturbances   Complete by:  As directed    Call MD for:  extreme fatigue   Complete by:  As directed    Call MD for:  hives   Complete by:  As directed    Call MD for:  persistant dizziness or light-headedness   Complete by:  As directed    Call MD for:  persistant nausea and vomiting   Complete by:  As directed    Call MD for:  redness, tenderness, or signs of infection (pain, swelling, redness, odor or green/yellow discharge around incision site)   Complete by:  As directed    Call MD for:  severe uncontrolled pain   Complete by:  As directed    Call MD for:  temperature >100.4   Complete by:  As directed    Diet - low sodium heart healthy   Complete by:  As directed    Discharge instructions   Complete by:  As directed     Tube feeding diet   Increase activity slowly   Complete by:  As directed      Allergies as of 11/11/2018      Reactions   Penicillins Hives   Has patient had a PCN reaction causing immediate rash, facial/tongue/throat swelling, SOB or lightheadedness with hypotension: Yes Has patient had a PCN reaction causing severe rash involving mucus membranes or skin necrosis: No Has patient had a PCN reaction that required hospitalization: No; was already in hosp Has patient had a PCN reaction occurring within the last 10 years: No If all of the above answers are "NO", then may proceed with Cephalosporin use.      Medication List    STOP taking these medications   aspirin EC 81 MG tablet   calcium carbonate 500 MG chewable tablet Commonly known as:  TUMS - dosed in mg elemental calcium   hydrochlorothiazide 10 mg/mL Susp   insulin NPH-regular Human (70-30) 100 UNIT/ML injection Commonly known as:  NOVOLIN 70/30   metoprolol tartrate 25 mg/10 mL Susp Commonly known as:  LOPRESSOR   oxyCODONE 5 MG/5ML solution Commonly known as:  ROXICODONE   pantoprazole sodium 40 mg/20 mL Pack Commonly known as:  PROTONIX   paroxetine 10 MG/5ML suspension Commonly known as:  PAXIL     TAKE these medications   bisacodyl 10 MG suppository Commonly known as:  DULCOLAX Place 1 suppository (10 mg total) rectally as needed for moderate constipation.   cloNIDine 0.1 mg/24hr patch Commonly known as:  CATAPRES - Dosed in mg/24 hr Place 1 patch (0.1 mg total) onto the skin once a week.   feeding supplement (GLUCERNA 1.5 CAL) Liqd Place 1,000 mLs into feeding tube continuous. Start @ 66ml/hr, increase by 35ml every 8 hours to a goal of 37ml/hr What changed:    when to take this  additional instructions  Another medication with the same name was removed. Continue taking this medication, and follow the directions you see here.   HYDROcodone-acetaminophen 7.5-325 mg/15 ml solution Commonly  known as:  HYCET Take 15 mLs by mouth every 6 (six) hours as needed for moderate pain.   insulin aspart protamine- aspart (70-30) 100 UNIT/ML injection Commonly known as:  NOVOLOG MIX 70/30 Inject 0.07 mLs (7 Units total) into the skin 2 (two) times daily with a meal.   insulin regular 100 units/mL injection Commonly known as:  NOVOLIN R,HUMULIN R For glucose 151 to 200 use 2 units, for 201 to 250 use 5 units, for 251-300 use 7 units, for 301-350 use 9 units, for 351 to 400 use 12 units. What changed:    how much to take  how to take this  when to take this   LORazepam 0.5 MG tablet Commonly known as:  ATIVAN Place 1 tablet (0.5 mg total) under the tongue every 6 (six) hours as needed for anxiety.   ondansetron 4 MG/5ML solution Commonly known as:  ZOFRAN Place 5 mLs (4 mg total) into feeding tube every 8 (eight) hours as needed for nausea or vomiting.   polyethylene glycol packet Commonly known as:  MIRALAX / GLYCOLAX 17 g by Per J Tube route daily as needed for mild constipation.   scopolamine 1  MG/3DAYS Commonly known as:  TRANSDERM-SCOP Place 1 patch (1.5 mg total) onto the skin every 3 (three) days.   Syringe (Disposable) 3 ML Misc Insulin syringes and needles.            Durable Medical Equipment  (From admission, onward)         Start     Ordered   11/11/18 1330  For home use only DME Tube feeding  Once    Comments:  Glucerna 1.5 @ 70 ml/hr via J-tube (1680 ml) or [facility] protocol   11/11/18 1330          Follow-up Information    Darrell Sutton, [place]. Schedule an appointment as soon as possible for a visit in 1 week(s).   Specialty:  Physician Assistant Why:  with repeat cbc/cmp Contact information: [place] [address] [phone]        [name], MD .   Specialty:  Cardiology Contact information: [address] [place] [address] [phone]        Truitt Merle, MD. Schedule an appointment as  soon as possible for a visit in 1 week(s).   Specialties:  Hematology, Oncology Contact information: [place] [address] [phone]        [place]. Schedule an appointment as soon as possible for a visit in 1 week(s).   Specialty:  Radiology Why:  biliary drain care/followup Contact information: [place] [id number] [place], [place] Follow up.   Specialty:  Home Health Services Why:  Home Health RN and [facility] will call with appointment Contact information: [place] [address] [phone]          Allergies  Allergen Reactions  . Penicillins Hives    Has patient had a PCN reaction causing immediate rash, facial/tongue/throat swelling, SOB or lightheadedness with hypotension: Yes Has patient had a PCN reaction causing severe rash involving mucus membranes or skin necrosis: No Has patient had a PCN reaction that required hospitalization: No; was already in hosp Has patient had a PCN reaction occurring within the last 10 years: No If all of the above answers are "NO", then may proceed with Cephalosporin use.     Consultations:  Oncology/IR/GI   Procedures/Studies: Dg Chest 1 View  Result Date: 10/17/2018 CLINICAL DATA:  Port-A-Cath placement. EXAM: CHEST  1 VIEW Radiation exposure index: 9.91 mGy. COMPARISON:  Sutton. FINDINGS: Five intraoperative fluoroscopic images of the chest demonstrate placement of right-sided Port-A-Cath. Distal tip appears to be in expected position of cavoatrial junction. IMPRESSION: Fluoroscopic guidance provided during placement of right-sided Port-A-Cath. Electronically Signed   By: Marijo Conception, M.D.   On: 10/17/2018 15:51   US Abdomen Complete  Result Date: 11/07/2018 CLINICAL DATA:  Jaundice, rule out biliary obstruction EXAM: ABDOMEN  ULTRASOUND COMPLETE COMPARISON:  CT 08/22/2018 FINDINGS: Gallbladder: Gallbladder is distended to 6 cm in diameter. There is sludge within the gallbladder. No echogenic gallstones. Negative sonographic Murphy's sign. No pericholecystic fluid. Significant gallbladder wall thickening. Common bile duct: Diameter: Dilated 9 mm. Liver: No focal lesion identified. Within normal limits in parenchymal echogenicity. Portal vein is patent on color Doppler imaging with normal direction of blood flow towards the liver. IVC: No abnormality visualized. Pancreas: Visualized portion unremarkable. Spleen: Size and appearance within normal limits. Right Kidney: Length: 12.6 cm. Cystic lesion with thin septation is coming anechoic  measures 2.3 cm. This corresponds to low-density cyst on comparison CT. Left Kidney: Length: 14.3 cm.  There is Abdominal aorta: No aneurysm visualized. Other findings: Sutton. IMPRESSION: 1. Dilatation of the common bile duct and gallbladder without obstructing lesion identified. No gallstones are evident but there is sludge within the lumen gallbladder. Consider contrast MRCP to further evaluate etiology of potential distal biliary obstruction. 2. No intrahepatic biliary duct dilatation identified. Electronically Signed   By: Suzy Bouchard M.D.   On: 11/07/2018 14:52   Mr 3d Recon At Scanner  Result Date: 11/07/2018 CLINICAL DATA:  Jaundice. Elevated liver function tests. History of esophageal cancer. EXAM: MRI ABDOMEN WITHOUT AND WITH CONTRAST (INCLUDING MRCP) TECHNIQUE: Multiplanar multisequence MR imaging of the abdomen was performed both before and after the administration of intravenous contrast. Heavily T2-weighted images of the biliary and pancreatic ducts were obtained, and three-dimensional MRCP images were rendered by post processing. CONTRAST:  10 cc Gadavist COMPARISON:  Multiple exams, including ultrasound 11/07/2018 and PET-CT from 09/29/2018 FINDINGS: Lower chest: Distal esophageal  wall thickening with surrounding edema noted in this patient with history of esophageal mass. There is scarring or atelectasis in the right lower lobe. Hepatobiliary: Sludge in the gallbladder. Mild intrahepatic biliary dilatation with abrupt tapered/beaked appearance of the common bile duct approximately corresponding to its course along the pancreatic head. No well-defined filling defect. The common hepatic duct measures about 8 mm in diameter proximal to this area of narrowing. In the area of narrowing there is subtly accentuated enhancement in the expected vicinity of the dorsal pancreatic duct. The appearance is not classic for pancreatic malignancy, there is no associated hypoenhancement in this region. Pancreas: No discrete pancreatic mass is identified. As noted above, the narrowed segment of the CBD in the vicinity of the pancreatic head may potentially have faintly associated accentuated enhancement. Spleen:  Unremarkable Adrenals/Urinary Tract: Right mid kidney Bosniak category 2 cyst anteriorly. Otherwise unremarkable. Adrenal glands normal. Stomach/Bowel: There is wall thickening in the gastric cardia with surrounding stranding and indistinctness of tissue planes. Jejunostomy tube noted. Vascular/Lymphatic: A porta hepatis node measures 1.6 cm in short axis on image 48/1102. Another porta hepatis node measures 1.2 cm in short axis on image 45/1102. Smaller periaortic lymph nodes are present and there are small lymph nodes around the distal esophagus and gastric cardia. Other:  No supplemental non-categorized findings. Musculoskeletal: Unremarkable IMPRESSION: 1. Marked narrowing in a 2.3 cm segment of the common bile duct corresponding with the segment of the duct along the pancreas. I do not see a definite pancreatic lesion but there is some faintly accentuated enhancement in the CBD in this vicinity. This could be from localized inflammation/cholangitis or a stricture. The CBD proximal to this  stricture is 0.8 cm in diameter. Sludge noted in the gallbladder without definite stones. 2. Distal esophageal wall thickening and thickening in the proximal stomach. There are also postoperative findings in the stomach. Surrounding inflammatory stranding is noted around the distal esophagus and proximal stomach, possibly therapy related. Surrounding small lymph nodes noted along with enlarged lymph nodes in the porta hepatis which are nonspecific and could be reactive or malignant. 3. Scarring or atelectasis in the right lower lobe. Electronically Signed   By: Van Clines M.D.   On: 11/07/2018 19:06   Dg C-arm 1-60 Min-no Report  Result Date: 10/17/2018 Fluoroscopy was utilized by the requesting physician.  No radiographic interpretation.   Mr Abdomen Mrcp Moise Boring Contast  Result Date: 11/07/2018 CLINICAL DATA:  Jaundice. Elevated  liver function tests. History of esophageal cancer. EXAM: MRI ABDOMEN WITHOUT AND WITH CONTRAST (INCLUDING MRCP) TECHNIQUE: Multiplanar multisequence MR imaging of the abdomen was performed both before and after the administration of intravenous contrast. Heavily T2-weighted images of the biliary and pancreatic ducts were obtained, and three-dimensional MRCP images were rendered by post processing. CONTRAST:  10 cc Gadavist COMPARISON:  Multiple exams, including ultrasound 11/07/2018 and PET-CT from 09/29/2018 FINDINGS: Lower chest: Distal esophageal wall thickening with surrounding edema noted in this patient with history of esophageal mass. There is scarring or atelectasis in the right lower lobe. Hepatobiliary: Sludge in the gallbladder. Mild intrahepatic biliary dilatation with abrupt tapered/beaked appearance of the common bile duct approximately corresponding to its course along the pancreatic head. No well-defined filling defect. The common hepatic duct measures about 8 mm in diameter proximal to this area of narrowing. In the area of narrowing there is subtly  accentuated enhancement in the expected vicinity of the dorsal pancreatic duct. The appearance is not classic for pancreatic malignancy, there is no associated hypoenhancement in this region. Pancreas: No discrete pancreatic mass is identified. As noted above, the narrowed segment of the CBD in the vicinity of the pancreatic head may potentially have faintly associated accentuated enhancement. Spleen:  Unremarkable Adrenals/Urinary Tract: Right mid kidney Bosniak category 2 cyst anteriorly. Otherwise unremarkable. Adrenal glands normal. Stomach/Bowel: There is wall thickening in the gastric cardia with surrounding stranding and indistinctness of tissue planes. Jejunostomy tube noted. Vascular/Lymphatic: A porta hepatis node measures 1.6 cm in short axis on image 48/1102. Another porta hepatis node measures 1.2 cm in short axis on image 45/1102. Smaller periaortic lymph nodes are present and there are small lymph nodes around the distal esophagus and gastric cardia. Other:  No supplemental non-categorized findings. Musculoskeletal: Unremarkable IMPRESSION: 1. Marked narrowing in a 2.3 cm segment of the common bile duct corresponding with the segment of the duct along the pancreas. I do not see a definite pancreatic lesion but there is some faintly accentuated enhancement in the CBD in this vicinity. This could be from localized inflammation/cholangitis or a stricture. The CBD proximal to this stricture is 0.8 cm in diameter. Sludge noted in the gallbladder without definite stones. 2. Distal esophageal wall thickening and thickening in the proximal stomach. There are also postoperative findings in the stomach. Surrounding inflammatory stranding is noted around the distal esophagus and proximal stomach, possibly therapy related. Surrounding small lymph nodes noted along with enlarged lymph nodes in the porta hepatis which are nonspecific and could be reactive or malignant. 3. Scarring or atelectasis in the right lower  lobe. Electronically Signed   By: Van Clines M.D.   On: 11/07/2018 19:06   Ir Int Lianne Cure Biliary Drain With Cholangiogram  Result Date: 11/09/2018 INDICATION: History of metastatic esophageal cancer, now with malignant biliary obstruction with associated jaundice and elevated bilirubin levels, presumably secondary to porta hepatis adenopathy. Patient with history of prior gastric bypass surgery and as such is not a candidate for attempted ERCP. Request made for placement of a percutaneous biliary drainage catheter for malignant biliary obstruction. EXAM: ULTRASOUND AND FLUOROSCOPIC GUIDED PERCUTANEOUS TRANSHEPATIC CHOLANGIOGRAM AND BILIARY TUBE PLACEMENT COMPARISON:  MRCP - 11/07/2018 MEDICATIONS: Vancomycin 1 gm IV; the antibiotic was administered within an appropriate time frame prior to the initiation of the procedure. CONTRAST:  20 cc Isovue-300, administered into biliary tree ANESTHESIA/SEDATION: Moderate (conscious) sedation was employed during this procedure. A total of Versed 4 mg, Dilaudid 3 mg IV and Fentanyl 50 mcg was administered  intravenously. Moderate Sedation Time: 36 minutes. The patient's level of consciousness and vital signs were monitored continuously by radiology nursing throughout the procedure under my direct supervision. FLUOROSCOPY TIME:  6 minutes (423 mGy) COMPLICATIONS: Sutton immediate. TECHNIQUE: Informed written consent was obtained from the patient and the patient's wife after a discussion of the risks, benefits and alternatives to treatment. Questions regarding the procedure were encouraged and answered. A timeout was performed prior to the initiation of the procedure. The right upper abdominal quadrant was prepped and draped in the usual sterile fashion, and a sterile drape was applied covering the operative field. Maximum barrier sterile technique with sterile gowns and gloves were used for the procedure. A timeout was performed prior to the initiation of the procedure.  Ultrasound scanning was performed of the midline of the upper abdomen demonstrated very minimal left-sided intrahepatic biliary ductal dilatation. After lying soft tissues were anesthetized with 1% lidocaine with epinephrine, two attempts were made access the left biliary tree under direct ultrasound fluoroscopic guided however this ultimately proved unsuccessful secondary to lack of significant left-sided intrahepatic biliary duct dilatation. As such, a spot along the right mid axillary line was marked fluoroscopically inferior to the right costophrenic angle. A peripheral very mildly dilated duct within the anterior aspect of the right lobe of the liver was targeted under direct ultrasound guidance after the overlying soft tissues were anesthetized with 1% lidocaine with epinephrine. Contrast injection demonstrates intra biliary puncture. The access bile duct was cannulated with a Nitrex wire which was advanced to the central aspect of the biliary tree. Tract was dilated with the inner 3 French catheter of the [place]ust[place] set. Contrast injection confirmed appropriate peripheral access. Next, a Nitrex wire was advanced to the level of the duodenum. The track was dilated with the remainder of the [place] set. A 4 French angled glide catheter was advanced through the outer sheath the Accustick set to the level of the duodenum. Contrast injection confirmed appropriate positioning Next, over a Amplatz wire, the tract was dilated and a 10.2 [place] biliary drainage catheter was advanced with coil ultimately locked within the duodenum. Contrast was injected and a completion radiograph was obtained. The catheter was connected to a drainage bag which yielded the brisk return of dark black colored bile. The catheter was secured to the skin with an interrupted suture. The patient tolerated the procedure well without immediate postprocedural complication. FINDINGS: Sonographic evaluation demonstrates very mild dilatation of  the left intrahepatic biliary tree. Attempted though ultimately unsuccessful attempted left-sided biliary drainage catheter placement secondary lack of significant left-sided intrahepatic biliary ductal dilatation. Sonographic evaluation demonstrates mild dilatation of the right intrahepatic biliary tree which was ultimately successfully opacified and cannulated, allowing placement of a 10 French percutaneous biliary drainage catheter via the peripheral aspect of a very mildly dilated bile duct within the anterior segment of the right lobe of the liver with end coiled and locked within the duodenum. The radiopaque marker is located within the central aspect of the right anterior biliary tree. IMPRESSION: Successful placement of a 10.2 French percutaneous biliary drainage catheter with end coiled and locked within the duodenum. PLAN: - Biliary brush biopsy and formal cholangiogram could be performed following normalization of the patient's LFTs as clinically indicated. - Otherwise, fluoroscopic guided biliary stent placement could be performed in 4-6 weeks as indicated. Electronically Signed   By: Sandi Mariscal M.D.   On: 11/09/2018 17:18    Subjective: Patient seen and examined at bedside.  He feels the same.  No overnight fever, nausea or vomiting.  Complains of some intermittent abdominal pain.  Tolerating his tube feedings.  Discharge Exam: Vitals:   11/11/18 0039 11/11/18 0549  BP: (!) 173/83 (!) 164/87  Pulse: 75 79  Resp:  16  Temp:  98.1 F (36.7 C)  SpO2:  96%   Vitals:   11/10/18 2200 11/11/18 0039 11/11/18 0419 11/11/18 0549  BP: (!) 184/87 (!) 173/83  (!) 164/87  Pulse: 83 75  79  Resp:    16  Temp:    98.1 F (36.7 C)  TempSrc:    Oral  SpO2: 97%   96%  Weight:   131.4 kg   Height:        General: Pt is alert, awake, not in acute distress Cardiovascular: rate controlled, S1/S2 + Respiratory: bilateral decreased breath sounds at bases Abdominal: Soft, mildly tender around the  drain site, ND, bowel sounds +.  Percutaneous biliary drain present.  Jejunostomy tube in place Extremities: no edema, no cyanosis    The results of significant diagnostics from this hospitalization (including imaging, microbiology, ancillary and laboratory) are listed below for reference.     Microbiology: No results found for this or any previous visit (from the past 240 hour(s)).   Labs: BNP (last 3 results) No results for input(s): BNP in the last 8760 hours. Basic Metabolic Panel: Recent Labs  Lab 11/07/18 1600 11/08/18 0500 11/09/18 0342 11/10/18 0309 11/11/18 0340  NA 137 139 139 139 139  K 4.1 4.0 3.6 3.6 3.5  CL 99 101 103 103 103  CO2 27 28 26 25 28   GLUCOSE 326* 252* 173* 187* 259*  BUN 14 12 10 12 12   CREATININE 0.55* 0.51* 0.49* 0.55* 0.56*  CALCIUM 8.9 8.7* 8.7* 8.6* 8.7*  MG  --   --  1.9 1.9 2.1   Liver Function Tests: Recent Labs  Lab 11/07/18 1600 11/08/18 0500 11/09/18 0342 11/10/18 0309 11/11/18 0340  AST 206* 171* 146* 86* 47*  ALT 443* 373* 313* 266* 192*  ALKPHOS 486* 454* 452* 413* 344*  BILITOT 7.6* 7.7* 8.9* 4.9* 3.5*  PROT 7.0 6.4* 6.3* 6.4* 6.6  ALBUMIN 3.2* 2.8* 2.8* 2.9* 2.8*   Recent Labs  Lab 11/07/18 1600 11/10/18 0309  LIPASE 26 286*   No results for input(s): AMMONIA in the last 168 hours. CBC: Recent Labs  Lab 11/07/18 0923 11/07/18 1600 11/08/18 0500 11/09/18 0342 11/10/18 0309 11/11/18 0340  WBC 8.0 7.4 7.2 8.3 11.4* 9.9  NEUTROABS 7.1  --   --  7.2 10.5* 9.0*  HGB 14.1 13.4 13.3 12.8* 13.3 13.0  HCT 46.5 46.3 45.3 42.8 44.4 43.7  MCV 76.5* 80.4 78.9* 79.1* 79.7* 79.0*  PLT 285 273 270 234 235 233   Cardiac Enzymes: No results for input(s): CKTOTAL, CKMB, CKMBINDEX, TROPONINI in the last 168 hours. BNP: Invalid input(s): POCBNP CBG: Recent Labs  Lab 11/10/18 1608 11/10/18 2201 11/11/18 0033 11/11/18 0418 11/11/18 0732  GLUCAP 230* 199* 195* 250* 245*   D-Dimer No results for input(s): DDIMER in  the last 72 hours. Hgb A1c No results for input(s): HGBA1C in the last 72 hours. Lipid Profile No results for input(s): CHOL, HDL, LDLCALC, TRIG, CHOLHDL, LDLDIRECT in the last 72 hours. Thyroid function studies No results for input(s): TSH, T4TOTAL, T3FREE, THYROIDAB in the last 72 hours.  Invalid input(s): FREET3 Anemia work up No results for input(s): VITAMINB12, FOLATE, FERRITIN, TIBC, IRON, RETICCTPCT in the last 72 hours. Urinalysis No results found for:  COLORURINE, APPEARANCEUR, LABSPEC, [place], GLUCOSEU, HGBUR, BILIRUBINUR, KETONESUR, PROTEINUR, UROBILINOGEN, NITRITE, LEUKOCYTESUR Sepsis Labs Invalid input(s): PROCALCITONIN,  WBC,  LACTICIDVEN Microbiology No results found for this or any previous visit (from the past 240 hour(s)).   Time coordinating discharge: 35 minutes  SIGNED:   Aline August, MD  [facility] 11/11/2018, 10:44 AM Pager: [phone]  If 7PM-7AM, please contact night-coverage [email or web address] Password [email or web address]

## 2018-11-11 NOTE — Care Management Note (Addendum)
Case Management Note  Patient Details  Name: Darrell Sutton MRN: [id number] Date of Birth: 1961-07-15  Subjective/Objective:    Metastatic esophageal adenocarcinoma                Action/Plan: [facility] aware of scheduled dc home today with resumption of care for [facility], and Education officer, museum. NCM spoke to pt and wife and states he has Jevity at home. Pt's formula was changed to Glucerna. MD notified for new order. Contacted [facility] to make aware.   Expected Discharge Date:  11/11/18               Expected Discharge Plan:  No Name  In-House Referral:  NA  Discharge planning Services  CM Consult  Post Acute Care Choice:  Home Health Choice offered to:  Patient  DME Arranged:  N/A DME Agency:  NA  HH Arranged:  RN, Disease Management, Social Work [employer] Agency:  [place]  Status of Service:  Completed, signed off  If discussed at [employer], dates discussed:    Additional Comments:  Erenest Rasher, RN 11/11/2018, 10:48 AM

## 2018-11-12 ENCOUNTER — Emergency Department (HOSPITAL_COMMUNITY)
Admission: EM | Admit: 2018-11-12 | Discharge: 2018-11-12 | Disposition: A | Discharge status: Home / Self Care | Payer: Self-pay | Attending: Emergency Medicine | Admitting: Emergency Medicine

## 2018-11-12 ENCOUNTER — Emergency Department (HOSPITAL_COMMUNITY): Payer: Self-pay

## 2018-11-12 ENCOUNTER — Other Ambulatory Visit: Payer: Self-pay | Admitting: Physician Assistant

## 2018-11-12 ENCOUNTER — Encounter (HOSPITAL_COMMUNITY): Payer: Self-pay | Admitting: Emergency Medicine

## 2018-11-12 ENCOUNTER — Other Ambulatory Visit: Payer: Self-pay

## 2018-11-12 DIAGNOSIS — Z8501 Personal history of malignant neoplasm of esophagus: Secondary | ICD-10-CM | POA: Insufficient documentation

## 2018-11-12 DIAGNOSIS — Z79899 Other long term (current) drug therapy: Secondary | ICD-10-CM | POA: Insufficient documentation

## 2018-11-12 DIAGNOSIS — Z96653 Presence of artificial knee joint, bilateral: Secondary | ICD-10-CM | POA: Insufficient documentation

## 2018-11-12 DIAGNOSIS — E119 Type 2 diabetes mellitus without complications: Secondary | ICD-10-CM | POA: Insufficient documentation

## 2018-11-12 DIAGNOSIS — I10 Essential (primary) hypertension: Secondary | ICD-10-CM | POA: Insufficient documentation

## 2018-11-12 DIAGNOSIS — Z760 Encounter for issue of repeat prescription: Secondary | ICD-10-CM | POA: Insufficient documentation

## 2018-11-12 DIAGNOSIS — Z794 Long term (current) use of insulin: Secondary | ICD-10-CM | POA: Insufficient documentation

## 2018-11-12 MED ORDER — HYDROCODONE-ACETAMINOPHEN 7.5-325 MG/15ML PO SOLN: 15.0000 mL | Freq: Four times a day (QID) | ORAL | 0 refills | Status: DC | PRN

## 2018-11-12 MED ORDER — HYDROMORPHONE HCL 1 MG/ML IJ SOLN
1.0000 mg | Freq: Once | INTRAMUSCULAR | Status: AC
  Administered 2018-11-12: 1 mg via INTRAMUSCULAR

## 2018-11-12 MED ORDER — HYDROMORPHONE HCL 1 MG/ML IJ SOLN
1.0000 mg | Freq: Once | INTRAMUSCULAR | Status: DC
  Filled 2018-11-12: qty 1

## 2018-11-12 NOTE — ED Triage Notes (Signed)
Patient was discharged with prescription but the Pharmacy will not have the medication until Thursday. Patient is here to get medication today.

## 2018-11-12 NOTE — ED Provider Notes (Signed)
MSE was initiated and I personally evaluated the patient and placed orders (if any) at  3:12 PM on November 12, 2018.  57 y.o. M with PMH/o stage IV esophageal cancer who was recently discharged from the hospital on 11/11/18.  Patient with history of metastatic esophageal adenocarcinoma who presented for evaluation of abnormal LFTs.  His evaluation showed he had CBD narrowing and he underwent a percutaneous biliary drainage on 11/09/18.  He was discharged from the hospital yesterday and was written a prescription for pain medication.  Wife states that they initially came to the ED today because they needed the prescription for his pain medication.  She states that the prescription was sent into the wrong pharmacy and that he was not able to get it.  Patient reports he is having a lot of pain from his biliary drain as well as from his previous cancer.  Distally, patient reports that since last night, he has had progressively worsening shortness of breath.  He states that he feels like he cannot take a deep breath in and feels like "there is an elephant on my chest."  Patient no oxygen requirement at home.  She denies any chest pain.  He does have history of MI.  Patient also reports he has had significant amount of pain to the biliary drain.  PEX: Patient speaking in short sentences.  Lungs clear to auscultation bilaterally.  Appears uncomfortable but no acute distress.  The patient appears stable so that the remainder of the MSE may be completed by another provider.    Volanda Napoleon, [name] 11/12/18 1514    Gareth Morgan, MD 11/13/18 [phone]

## 2018-11-12 NOTE — Discharge Instructions (Signed)
Follow-up as previously indicated.  Return to the ED for onset of new, concerning complaints.

## 2018-11-12 NOTE — ED Provider Notes (Signed)
[facility] DEPT Provider Note   CSN: [id number] Arrival date & time: 11/12/18  1409     History   Chief Complaint Chief Complaint  Patient presents with  . Shortness of Breath    HPI Darrell Sutton is a 57 y.o. male.  HPI   Darrell Sutton is a 57 y.o. male, with a history of DM, esophageal cancer, hyperlipidemia, HTN, GERD, MI, presenting to the ED with a problem with his previously prescribed pain medication.  Patient states he was discharged from the hospital yesterday and was prescribed pain medication.  This prescription was reportedly sent to the "wrong pharmacy" and the patient was told the pharmacy would not have his prescription ready until January 2.  Patient is concerned about having adequate pain management in the meantime.  Pain management is indicated due to a CBD drain placed because of elevated LFTs and subsequently discovered restricted CBD.  Patient did initially mention to the [facility] provider who first saw him in fast track that he was experiencing some shortness of breath.  He states that shortness of breath began yesterday.  Denies fever/chills, acute nausea, vomiting, diarrhea, acute cough, lower extremity edema/pain, or any other complaints.   Past Medical History:  Diagnosis Date  . Anxiety 2009   started with divorce  . Cancer [facility])    cancer of lower third of esophagus  . Diabetes mellitus without complication ([place])   . Gastric ulcer    from NSAID overuse  . GERD (gastroesophageal reflux disease)   . Hyperlipidemia   . Hypertension   . MI (myocardial infarction) ([place]) 2011   has two stents  . Obesity    highest weight in 500's    Patient Active Problem List   Diagnosis Date Noted  . Choledocholithiasis 11/07/2018  . Abnormal LFTs 11/07/2018  . Acute urinary retention 10/19/2018  . 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 Protein-calorie malnutrition, moderate ([place]) 10/17/2018  . Jejunostomy  tube present 10/17/2018  . Port-A-Cath in place ([facility]) 10/17/2018  . Goals of care, counseling/discussion 10/17/2018  . Cancer of lower third of esophagus ([place]) 09/26/2018  . Coronary artery disease involving native coronary artery of native heart without angina pectoris 08/31/2018  . Dizziness 08/31/2018  . Type 2 diabetes mellitus with complication, without long-term current use of insulin ([place]) 08/31/2018  . Gastroesophageal reflux disease 08/24/2018  . Hiatal hernia 08/24/2018  . History of MI (myocardial infarction) 08/24/2018  . Mass of bladder 08/24/2018  . Obesity   . Hypertension   . Hyperlipidemia   . Diabetes mellitus without complication ([place])   . Anxiety 11/16/2007    Past Surgical History:  Procedure Laterality Date  . [place] SURGERY  2010  . core biopsy  10/11/2018   core biopsy of lymph nodes in Interventional Radiology  . GASTRIC BYPASS  11/24/2008  . GASTROJEJUNOSTOMY N/A 10/17/2018   Procedure: LAPAROSCOPIC PLACEMENT OF  LEFT FEEDING JEJUNOSTOMY TUBE;  Surgeon: Michael Boston, MD;  Location: [facility] ORS;  Service: General;  Laterality: N/A;  . IR INT EXT BILIARY DRAIN WITH CHOLANGIOGRAM  11/09/2018  . JOINT REPLACEMENT     Bilateral knee  . PORTACATH PLACEMENT Right 10/17/2018   Procedure: INSERTION PORT-A-CATH WITH ULTRASOUND AND FLUORO;  Surgeon: Michael Boston, MD;  Location: [facility] ORS;  Service: General;  Laterality: Right;  . TONSILECTOMY/ADENOIDECTOMY WITH MYRINGOTOMY          Home Medications    Prior to Admission medications   Medication Sig Start Date  End Date Taking? Authorizing Provider  bisacodyl (DULCOLAX) 10 MG suppository Place 1 suppository (10 mg total) rectally as needed for moderate constipation. 11/11/18  Yes Aline August, MD  cloNIDine (CATAPRES - DOSED IN MG/24 HR) 0.1 mg/24hr patch Place 1 patch (0.1 mg total) onto the skin once a week. 10/31/18  Yes Tanner, Lyndon Code., [name]  insulin aspart protamine- aspart (NOVOLOG MIX 70/30) (70-30) 100  UNIT/ML injection Inject 0.07 mLs (7 Units total) into the skin 2 (two) times daily with a meal. 10/31/18  Yes Tanner, Lyndon Code., [name]  insulin regular (NOVOLIN R,HUMULIN R) 100 units/mL injection For glucose 151 to 200 use 2 units, for 201 to 250 use 5 units, for 251-300 use 7 units, for 301-350 use 9 units, for 351 to 400 use 12 units. Patient taking differently: Inject 2-12 Units into the skin 2 (two) times daily before a meal. For glucose 151 to 200 use 2 units, for 201 to 250 use 5 units, for 251-300 use 7 units, for 301-350 use 9 units, for 351 to 400 use 12 units. 10/31/18 10/31/19 Yes Tanner, Lyndon Code., [name]  LORazepam (ATIVAN) 0.5 MG tablet Place 1 tablet (0.5 mg total) under the tongue every 6 (six) hours as needed for anxiety. 10/31/18  Yes Tanner, Lyndon Code., [name]  Nutritional Supplements (FEEDING SUPPLEMENT, GLUCERNA 1.5 CAL,) LIQD Place 1,000 mLs into feeding tube continuous. Start @ 58ml/hr, increase by 23ml every 8 hours to a goal of 26ml/hr 11/11/18  Yes Aline August, MD  ondansetron (ZOFRAN) 4 MG/5ML solution Place 5 mLs (4 mg total) into feeding tube every 8 (eight) hours as needed for nausea or vomiting. 10/25/18  Yes [name], PA  scopolamine (TRANSDERM-SCOP) 1 MG/3DAYS Place 1 patch (1.5 mg total) onto the skin every 3 (three) days. 10/31/18  Yes Tanner, Lyndon Code., [name]  HYDROcodone-acetaminophen (HYCET) 7.5-325 mg/15 ml solution Take 15 mLs by mouth every 6 (six) hours as needed for moderate pain. 11/12/18   Aunya Lemler C, [name]  polyethylene glycol (MIRALAX / GLYCOLAX) packet 17 g by Per J Tube route daily as needed for mild constipation. Patient not taking: Reported on 120-Nov-202019 11/11/18   Aline August, MD  Syringe, Disposable, 3 ML MISC Insulin syringes and needles. Patient not taking: Reported on 11/07/2018 10/18/18   Arrien, Jimmy Picket, MD    Family History Family History  Problem Relation Age of Onset  . Hyperlipidemia Mother   . Hypertension Mother   .  Hyperlipidemia Father   . Hypertension Father   . Stroke Father   . Cancer Father        NHL  . Diabetes Brother   . Heart attack Brother 48  . Hyperlipidemia Brother   . Hypertension Brother   . Heart attack Maternal Grandmother   . Heart attack Maternal Grandfather   . Early death Maternal Grandfather   . Esophageal cancer Paternal Grandmother   . Colon cancer Paternal Grandfather   . Colon cancer Cousin   . Stomach cancer Neg Hx     Social History Social History   Tobacco Use  . Smoking status: Never Smoker  . Smokeless tobacco: Never Used  Substance Use Topics  . Alcohol use: Never    Frequency: Never  . Drug use: Not Currently    Types: Marijuana    Comment: Youth years- not since earlt 90's     Allergies   Penicillins   Review of Systems Review of Systems  Constitutional: Negative for chills, diaphoresis and fever.  Respiratory:  Positive for shortness of breath. Negative for cough.   Cardiovascular: Negative for chest pain.  Gastrointestinal: Positive for abdominal pain. Negative for diarrhea, nausea and vomiting.  Neurological: Negative for weakness and numbness.  All other systems reviewed and are negative.    Physical Exam Updated Vital Signs BP 137/73 (BP Location: Right Arm)   Pulse 96   Temp 98.3 F (36.8 C) (Oral)   Resp 18   Ht 6' (1.829 m)   Wt 131.4 kg   SpO2 100%   BMI 39.29 kg/m   Physical Exam Vitals signs and nursing note reviewed.  Constitutional:      General: He is not in acute distress.    Appearance: He is well-developed. He is not diaphoretic.  HENT:     Head: Normocephalic and atraumatic.  Eyes:     Conjunctiva/sclera: Conjunctivae normal.  Neck:     Musculoskeletal: Neck supple.  Cardiovascular:     Rate and Rhythm: Normal rate and regular rhythm.     Heart sounds: Normal heart sounds.  Pulmonary:     Effort: Pulmonary effort is normal. No respiratory distress.     Breath sounds: Normal breath sounds.      Comments: No increased work of breathing.  Speaks in full sentences without difficulty. Abdominal:     Palpations: Abdomen is soft.     Tenderness: There is no abdominal tenderness. There is no guarding.     Comments: Patient has tenderness across the upper abdomen, which he states is consistent with pain he has been experiencing since the drain was placed. Drain site looks good without evidence of acute infection.  Lymphadenopathy:     Cervical: No cervical adenopathy.  Skin:    General: Skin is warm and dry.  Neurological:     Mental Status: He is alert.  Psychiatric:        Behavior: Behavior normal.      ED Treatments / Results  Labs (all labs ordered are listed, but only abnormal results are displayed) Labs Reviewed - No data to display  EKG None  Radiology No results found.  Procedures Procedures (including critical care time)  Medications Ordered in ED Medications  HYDROmorphone (DILAUDID) injection 1 mg (1 mg Intramuscular Given 11/12/18 1637)     Initial Impression / Assessment and Plan / ED Course  I have reviewed the triage vital signs and the nursing notes.  Pertinent labs & imaging results that were available during my care of the patient were reviewed by me and considered in my medical decision making (see chart for details).  Clinical Course as of Nov 12 1840  Nancy Fetter Nov 12, 2018  1635 Patient refused all workup when RN tried to obtain IV and blood draw.   [SJ]  2683 [facility] prefers [facility].  We directed the patient to a local 24-hour pharmacy.  [facility] [address] and confirmed they not only have the Hycet concentration prescribed, but also the total dispense amount required.   [SJ]    Clinical Course User Index [SJ] Vere Diantonio C, [name]   Patient did present stating he needed an issue with his home pain medication mitigated.  However, he did also mention shortness of breath.  It was my intention to perform a work-up on this patient.     However, when the RN tried to obtain labs and start an IV, patient and his wife became upset stating that I must have misunderstood them.  Patient has no acute complaints.  He  states his feeling of being is due to his hesitancy to take a deep breath because of his upper abdominal pain.  He wants absolutely no further work-up and would just like the issue with his pain medication addressed. To resolve this issue, I simply modified his Hycet prescription that was previously written electronically and converted to a printed prescription.   Findings and plan of care discussed with Shirlyn Goltz, MD. Dr. Darl Householder personally evaluated and examined this patient.  Final Clinical Impressions(s) / ED Diagnoses   Final diagnoses:  Medication management    ED Discharge Orders         Ordered    HYDROcodone-acetaminophen (HYCET) 7.5-325 mg/15 ml solution  Every 6 hours PRN     11/12/18 1642           Lorayne Bender, [name] 11/12/18 1841    Drenda Freeze, MD 11/12/18 2342

## 2018-11-13 NOTE — Progress Notes (Signed)
  Radiation Oncology         [phone] ________________________________  Name: Darrell Sutton MRN: [id number]  Date: 11/09/2018  DOB: Jul 16, 1961  End of Treatment Note  Diagnosis:   57 y.o. male with Stage IV Adenocarcinoma of the distal esophagus     Indication for treatment:  Palliative  Radiation treatment dates:   10/23/2018 - 11/09/2018  Site/dose:   Lower Esophagus / 39 Gy in 13 fractions  Beams/energy:   IMRT / 6X Photon  Narrative: The patient tolerated radiation treatment relatively well.   He experienced moderate fatigue and continued pain with swallowing. He also noted nausea, dry mouth, and thick saliva.    Plan: The patient has completed radiation treatment. Still hoping for improvement in swallowing; patient has received a significant palliative dose which hopefully will improve dysphasia within the next couple of weeks. The patient will return to radiation oncology clinic for routine followup in one month. I advised them to call or return sooner if they have any questions or concerns related to their recovery or treatment.  ------------------------------------------------  Jodelle Gross, MD, PhD  This document serves as a record of services personally performed by [name], MD. It was created on his behalf by Rae Lips, a trained medical scribe. The creation of this record is based on the scribe's personal observations and the provider's statements to them. This document has been checked and approved by the attending provider.

## 2018-11-14 ENCOUNTER — Telehealth: Payer: Self-pay

## 2018-11-14 NOTE — Telephone Encounter (Signed)
Faxed order to [facility] for [facility] regarding enteral feedings, sent to HIM for scan

## 2018-11-16 ENCOUNTER — Other Ambulatory Visit (HOSPITAL_COMMUNITY): Payer: Self-pay | Admitting: Interventional Radiology

## 2018-11-16 DIAGNOSIS — K831 Obstruction of bile duct: Secondary | ICD-10-CM

## 2018-11-17 ENCOUNTER — Other Ambulatory Visit: Payer: Self-pay | Admitting: Physician Assistant

## 2018-11-19 ENCOUNTER — Encounter (HOSPITAL_COMMUNITY): Payer: Self-pay | Admitting: Nurse Practitioner

## 2018-11-19 ENCOUNTER — Observation Stay (HOSPITAL_COMMUNITY)
Admission: EM | Admit: 2018-11-19 | Discharge: 2018-11-22 | Disposition: A | Discharge status: Home / Self Care | Payer: Self-pay | Attending: Internal Medicine | Admitting: Internal Medicine

## 2018-11-19 ENCOUNTER — Emergency Department (HOSPITAL_COMMUNITY): Payer: Self-pay

## 2018-11-19 DIAGNOSIS — Z66 Do not resuscitate: Secondary | ICD-10-CM | POA: Insufficient documentation

## 2018-11-19 DIAGNOSIS — Z923 Personal history of irradiation: Secondary | ICD-10-CM | POA: Insufficient documentation

## 2018-11-19 DIAGNOSIS — Z833 Family history of diabetes mellitus: Secondary | ICD-10-CM | POA: Insufficient documentation

## 2018-11-19 DIAGNOSIS — E875 Hyperkalemia: Secondary | ICD-10-CM

## 2018-11-19 DIAGNOSIS — N179 Acute kidney failure, unspecified: Secondary | ICD-10-CM | POA: Insufficient documentation

## 2018-11-19 DIAGNOSIS — F419 Anxiety disorder, unspecified: Secondary | ICD-10-CM | POA: Insufficient documentation

## 2018-11-19 DIAGNOSIS — Z79899 Other long term (current) drug therapy: Secondary | ICD-10-CM | POA: Insufficient documentation

## 2018-11-19 DIAGNOSIS — Z9884 Bariatric surgery status: Secondary | ICD-10-CM | POA: Insufficient documentation

## 2018-11-19 DIAGNOSIS — Z800 Family history of malignant neoplasm of digestive organs: Secondary | ICD-10-CM | POA: Insufficient documentation

## 2018-11-19 DIAGNOSIS — I2510 Atherosclerotic heart disease of native coronary artery without angina pectoris: Secondary | ICD-10-CM | POA: Insufficient documentation

## 2018-11-19 DIAGNOSIS — C787 Secondary malignant neoplasm of liver and intrahepatic bile duct: Secondary | ICD-10-CM | POA: Insufficient documentation

## 2018-11-19 DIAGNOSIS — K219 Gastro-esophageal reflux disease without esophagitis: Secondary | ICD-10-CM | POA: Insufficient documentation

## 2018-11-19 DIAGNOSIS — Z808 Family history of malignant neoplasm of other organs or systems: Secondary | ICD-10-CM | POA: Insufficient documentation

## 2018-11-19 DIAGNOSIS — E860 Dehydration: Secondary | ICD-10-CM | POA: Insufficient documentation

## 2018-11-19 DIAGNOSIS — Z6835 Body mass index (BMI) 35.0-35.9, adult: Secondary | ICD-10-CM | POA: Insufficient documentation

## 2018-11-19 DIAGNOSIS — Z934 Other artificial openings of gastrointestinal tract status: Secondary | ICD-10-CM | POA: Insufficient documentation

## 2018-11-19 DIAGNOSIS — E1165 Type 2 diabetes mellitus with hyperglycemia: Secondary | ICD-10-CM | POA: Insufficient documentation

## 2018-11-19 DIAGNOSIS — C155 Malignant neoplasm of lower third of esophagus: Secondary | ICD-10-CM | POA: Insufficient documentation

## 2018-11-19 DIAGNOSIS — E1101 Type 2 diabetes mellitus with hyperosmolarity with coma: Principal | ICD-10-CM | POA: Insufficient documentation

## 2018-11-19 DIAGNOSIS — E669 Obesity, unspecified: Secondary | ICD-10-CM | POA: Insufficient documentation

## 2018-11-19 DIAGNOSIS — Z8719 Personal history of other diseases of the digestive system: Secondary | ICD-10-CM | POA: Insufficient documentation

## 2018-11-19 DIAGNOSIS — E118 Type 2 diabetes mellitus with unspecified complications: Secondary | ICD-10-CM | POA: Diagnosis present

## 2018-11-19 DIAGNOSIS — E785 Hyperlipidemia, unspecified: Secondary | ICD-10-CM | POA: Insufficient documentation

## 2018-11-19 DIAGNOSIS — Z8249 Family history of ischemic heart disease and other diseases of the circulatory system: Secondary | ICD-10-CM | POA: Insufficient documentation

## 2018-11-19 DIAGNOSIS — Z794 Long term (current) use of insulin: Secondary | ICD-10-CM | POA: Insufficient documentation

## 2018-11-19 DIAGNOSIS — R739 Hyperglycemia, unspecified: Secondary | ICD-10-CM

## 2018-11-19 DIAGNOSIS — Z880 Allergy status to penicillin: Secondary | ICD-10-CM | POA: Insufficient documentation

## 2018-11-19 DIAGNOSIS — K831 Obstruction of bile duct: Secondary | ICD-10-CM | POA: Insufficient documentation

## 2018-11-19 DIAGNOSIS — I252 Old myocardial infarction: Secondary | ICD-10-CM | POA: Insufficient documentation

## 2018-11-19 DIAGNOSIS — I10 Essential (primary) hypertension: Secondary | ICD-10-CM | POA: Insufficient documentation

## 2018-11-19 LAB — CBC WITH DIFFERENTIAL/PLATELET
Abs Immature Granulocytes: 0.13 10*3/uL — ABNORMAL HIGH (ref 0.00–0.07)
Basophils Absolute: 0 10*3/uL (ref 0.0–0.1)
Basophils Relative: 0 %
Eosinophils Absolute: 0 10*3/uL (ref 0.0–0.5)
Eosinophils Relative: 0 %
HCT: 54 % — ABNORMAL HIGH (ref 39.0–52.0)
Hemoglobin: 16.1 g/dL (ref 13.0–17.0)
IMMATURE GRANULOCYTES: 1 %
LYMPHS PCT: 1 %
Lymphs Abs: 0.1 10*3/uL — ABNORMAL LOW (ref 0.7–4.0)
MCH: 24.2 pg — ABNORMAL LOW (ref 26.0–34.0)
MCHC: 29.8 g/dL — ABNORMAL LOW (ref 30.0–36.0)
MCV: 81.2 fL (ref 80.0–100.0)
Monocytes Absolute: 1.2 10*3/uL — ABNORMAL HIGH (ref 0.1–1.0)
Monocytes Relative: 8 %
NEUTROS ABS: 13 10*3/uL — AB (ref 1.7–7.7)
Neutrophils Relative %: 90 %
Platelets: 299 10*3/uL (ref 150–400)
RBC: 6.65 MIL/uL — ABNORMAL HIGH (ref 4.22–5.81)
RDW: 20.1 % — ABNORMAL HIGH (ref 11.5–15.5)
WBC: 14.5 10*3/uL — ABNORMAL HIGH (ref 4.0–10.5)
nRBC: 0 % (ref 0.0–0.2)

## 2018-11-19 LAB — COMPREHENSIVE METABOLIC PANEL
ALT: 48 U/L — ABNORMAL HIGH (ref 0–44)
AST: 22 U/L (ref 15–41)
Albumin: 3.3 g/dL — ABNORMAL LOW (ref 3.5–5.0)
Alkaline Phosphatase: 174 U/L — ABNORMAL HIGH (ref 38–126)
Anion gap: 12 (ref 5–15)
BUN: 67 mg/dL — ABNORMAL HIGH (ref 6–20)
CO2: 26 mmol/L (ref 22–32)
Calcium: 9.9 mg/dL (ref 8.9–10.3)
Chloride: 92 mmol/L — ABNORMAL LOW (ref 98–111)
Creatinine, Ser: 1.19 mg/dL (ref 0.61–1.24)
GFR calc Af Amer: >60 mL/min (ref >60)
GFR calc non Af Amer: >60 mL/min (ref >60)
Glucose, Bld: 766 mg/dL (ref 70–99)
Potassium: 5.2 mmol/L — ABNORMAL HIGH (ref 3.5–5.1)
Sodium: 130 mmol/L — ABNORMAL LOW (ref 135–145)
Total Bilirubin: 1.7 mg/dL — ABNORMAL HIGH (ref 0.3–1.2)
Total Protein: 8.5 g/dL — ABNORMAL HIGH (ref 6.5–8.1)

## 2018-11-19 LAB — CBG MONITORING, ED

## 2018-11-19 LAB — LIPASE, BLOOD: Lipase: 51 U/L (ref 11–51)

## 2018-11-19 MED ORDER — INSULIN REGULAR(HUMAN) IN NACL 100-0.9 UT/100ML-% IV SOLN
INTRAVENOUS | Status: DC
  Administered 2018-11-20: 5.4 [IU]/h via INTRAVENOUS
  Filled 2018-11-19: qty 100

## 2018-11-19 MED ORDER — SODIUM CHLORIDE 0.9 % IV SOLN: INTRAVENOUS | Status: DC

## 2018-11-19 MED ORDER — SODIUM CHLORIDE 0.9 % IV BOLUS: 1000.0000 mL | Freq: Once | INTRAVENOUS | Status: DC

## 2018-11-19 MED ORDER — HYDROMORPHONE HCL 1 MG/ML IJ SOLN
1.0000 mg | Freq: Once | INTRAMUSCULAR | Status: AC
  Administered 2018-11-19: 1 mg via INTRAVENOUS
  Filled 2018-11-19: qty 1

## 2018-11-19 MED ORDER — SODIUM CHLORIDE 0.9 % IV BOLUS
1000.0000 mL | Freq: Once | INTRAVENOUS | Status: AC
  Administered 2018-11-20: 1000 mL via INTRAVENOUS

## 2018-11-19 MED ORDER — DEXTROSE-NACL 5-0.45 % IV SOLN
INTRAVENOUS | Status: DC
  Administered 2018-11-20: 05:00:00 via INTRAVENOUS

## 2018-11-19 MED ORDER — SODIUM CHLORIDE 0.9 % IV BOLUS
1000.0000 mL | Freq: Once | INTRAVENOUS | Status: AC
  Administered 2018-11-19: 1000 mL via INTRAVENOUS

## 2018-11-19 NOTE — ED Notes (Signed)
Bed: VO45 Expected date:  Expected time:  Means of arrival:  Comments: 44M hyperglycemia hx esophageal cancer.

## 2018-11-19 NOTE — ED Triage Notes (Signed)
Pt is presented from by EMS, collaborates report that he is experiencing elevated BG readings despite taking his antidiabetics as prescribed. Currently receiving chemo treatments and tube feedings.

## 2018-11-19 NOTE — ED Provider Notes (Signed)
[facility] DEPT Provider Note   CSN: [id number] Arrival date & time: 11/19/18  2140     History   Chief Complaint Chief Complaint  Patient presents with  . Hyperglycemia  . CA Pt    HPI Darrell Sutton is a 58 y.o. male.  Patient with past medical history remarkable for esophageal cancer with metastases to surrounding lymph nodes unlikely to his liver, presents to the emergency department feeling dehydrated.  All of his nutrition is via feeding tube.  He states that he does not feel that he has been being adequately hydrated.  Additionally, he states that his blood sugar has been running high.  He states that he has had a slight cough, but denies any fever.  Denies any other new symptoms.  He states that he has chronic upper abdominal pain, which she rates as a 7 out of 10.  Had recent MRCP and biliary drain placed.  Denies any complaints or complications related to the biliary drain or to his feeding tube.  The history is provided by the patient. No language interpreter was used.    Past Medical History:  Diagnosis Date  . Anxiety 2009   started with divorce  . Cancer [facility])    cancer of lower third of esophagus  . Diabetes mellitus without complication ([place])   . Gastric ulcer    from NSAID overuse  . GERD (gastroesophageal reflux disease)   . Hyperlipidemia   . Hypertension   . MI (myocardial infarction) ([place]) 2011   has two stents  . Obesity    highest weight in 500's    Patient Active Problem List   Diagnosis Date Noted  . Choledocholithiasis 11/07/2018  . Abnormal LFTs 11/07/2018  . Acute urinary retention 10/19/2018  . 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 Protein-calorie malnutrition, moderate ([place]) 10/17/2018  . Jejunostomy tube present 10/17/2018  . Port-A-Cath in place ([facility]) 10/17/2018  . Goals of care, counseling/discussion 10/17/2018  . Cancer of lower third of esophagus ([place]) 09/26/2018  . Coronary  artery disease involving native coronary artery of native heart without angina pectoris 08/31/2018  . Dizziness 08/31/2018  . Type 2 diabetes mellitus with complication, without long-term current use of insulin ([place]) 08/31/2018  . Gastroesophageal reflux disease 08/24/2018  . Hiatal hernia 08/24/2018  . History of MI (myocardial infarction) 08/24/2018  . Mass of bladder 08/24/2018  . Obesity   . Hypertension   . Hyperlipidemia   . Diabetes mellitus without complication ([place])   . Anxiety 11/16/2007    Past Surgical History:  Procedure Laterality Date  . [facility] SURGERY  2010  . core biopsy  10/11/2018   core biopsy of lymph nodes in Interventional Radiology  . GASTRIC BYPASS  11/24/2008  . GASTROJEJUNOSTOMY N/A 10/17/2018   Procedure: LAPAROSCOPIC PLACEMENT OF  LEFT FEEDING JEJUNOSTOMY TUBE;  Surgeon: Michael Boston, MD;  Location: [facility] ORS;  Service: General;  Laterality: N/A;  . IR INT EXT BILIARY DRAIN WITH CHOLANGIOGRAM  11/09/2018  . JOINT REPLACEMENT     Bilateral knee  . PORTACATH PLACEMENT Right 10/17/2018   Procedure: INSERTION PORT-A-CATH WITH ULTRASOUND AND FLUORO;  Surgeon: Michael Boston, MD;  Location: [facility] ORS;  Service: General;  Laterality: Right;  . TONSILECTOMY/ADENOIDECTOMY WITH MYRINGOTOMY          Home Medications    Prior to Admission medications   Medication Sig Start Date End Date Taking? Authorizing Provider  bisacodyl (DULCOLAX) 10 MG suppository Place 1 suppository (10 mg  total) rectally as needed for moderate constipation. 11/11/18   Aline August, MD  cloNIDine (CATAPRES - DOSED IN MG/24 HR) 0.1 mg/24hr patch Place 1 patch (0.1 mg total) onto the skin once a week. 10/31/18   Tanner, Lyndon Code., [name]  hydrochlorothiazide (HYDRODIURIL) 25 MG tablet CRUSH 1 TABLET AND ADMINISTER PER TUBE EVERY MORNING 11/17/18   [name], PA  HYDROcodone-acetaminophen (HYCET) 7.5-325 mg/15 ml solution Take 15 mLs by mouth every 6 (six) hours as needed for moderate pain.  11/12/18   Joy, Shawn C, [name]  insulin aspart protamine- aspart (NOVOLOG MIX 70/30) (70-30) 100 UNIT/ML injection Inject 0.07 mLs (7 Units total) into the skin 2 (two) times daily with a meal. 10/31/18   Tanner, Lyndon Code., [name]  insulin regular (NOVOLIN R,HUMULIN R) 100 units/mL injection For glucose 151 to 200 use 2 units, for 201 to 250 use 5 units, for 251-300 use 7 units, for 301-350 use 9 units, for 351 to 400 use 12 units. Patient taking differently: Inject 2-12 Units into the skin 2 (two) times daily before a meal. For glucose 151 to 200 use 2 units, for 201 to 250 use 5 units, for 251-300 use 7 units, for 301-350 use 9 units, for 351 to 400 use 12 units. 10/31/18 10/31/19  Tanner, Lyndon Code., [name]  LORazepam (ATIVAN) 0.5 MG tablet Place 1 tablet (0.5 mg total) under the tongue every 6 (six) hours as needed for anxiety. 10/31/18   Tanner, Lyndon Code., [name]  metoprolol tartrate (LOPRESSOR) 25 MG tablet CRUSH 1 TABLET AND ADMINISTER VIA TUBE TWICE A DAY 11/13/18   [name], PA  Nutritional Supplements (FEEDING SUPPLEMENT, GLUCERNA 1.5 CAL,) LIQD Place 1,000 mLs into feeding tube continuous. Start @ 3ml/hr, increase by 36ml every 8 hours to a goal of 23ml/hr 11/11/18   Aline August, MD  ondansetron [facility]) 4 MG/5ML solution Place 5 mLs (4 mg total) into feeding tube every 8 (eight) hours as needed for nausea or vomiting. 10/25/18   [name], PA  polyethylene glycol (MIRALAX / GLYCOLAX) packet 17 g by Per J Tube route daily as needed for mild constipation. Patient not taking: Reported on 1Oct 05, 202019 11/11/18   Aline August, MD  scopolamine (TRANSDERM-SCOP) 1 MG/3DAYS Place 1 patch (1.5 mg total) onto the skin every 3 (three) days. 10/31/18   Harle Stanford., [name]  Syringe, Disposable, 3 ML MISC Insulin syringes and needles. Patient not taking: Reported on 11/07/2018 10/18/18   Arrien, Jimmy Picket, MD    Family History Family History  Problem Relation Age of Onset  . Hyperlipidemia  Mother   . Hypertension Mother   . Hyperlipidemia Father   . Hypertension Father   . Stroke Father   . Cancer Father        NHL  . Diabetes Brother   . Heart attack Brother 8  . Hyperlipidemia Brother   . Hypertension Brother   . Heart attack Maternal Grandmother   . Heart attack Maternal Grandfather   . Early death Maternal Grandfather   . Esophageal cancer Paternal Grandmother   . Colon cancer Paternal Grandfather   . Colon cancer Cousin   . Stomach cancer Neg Hx     Social History Social History   Tobacco Use  . Smoking status: Never Smoker  . Smokeless tobacco: Never Used  Substance Use Topics  . Alcohol use: Never    Frequency: Never  . Drug use: Not Currently    Types: Marijuana    Comment: Youth years- not since  earlt 90's     Allergies   Penicillins   Review of Systems Review of Systems  All other systems reviewed and are negative.    Physical Exam Updated Vital Signs BP (!) 143/99 (BP Location: Right Arm)   Pulse (!) 116   Temp (!) 96 F (35.6 C) (Oral) Comment: Pt uses sterile water to rinse and spit frequently  Resp 16   Ht 6' (1.829 m)   Wt 120.2 kg   SpO2 95%   BMI 35.94 kg/m   Physical Exam Vitals signs and nursing note reviewed.  Constitutional:      Appearance: He is well-developed.     Comments: Chronically ill-appearing  HENT:     Head: Normocephalic and atraumatic.  Eyes:     General: No scleral icterus.       Right eye: No discharge.        Left eye: No discharge.     Conjunctiva/sclera: Conjunctivae normal.     Pupils: Pupils are equal, round, and reactive to light.  Neck:     Musculoskeletal: Normal range of motion and neck supple.     Vascular: No JVD.  Cardiovascular:     Rate and Rhythm: Normal rate and regular rhythm.     Heart sounds: Normal heart sounds. No murmur. No friction rub. No gallop.   Pulmonary:     Effort: Pulmonary effort is normal. No respiratory distress.     Breath sounds: Normal breath sounds.  No wheezing or rales.  Chest:     Chest wall: No tenderness.  Abdominal:     General: There is no distension.     Palpations: Abdomen is soft. There is no mass.     Tenderness: There is no abdominal tenderness. There is no guarding or rebound.     Comments: Functioning biliary drain in place in right upper abdomen Left upper abdomen remarkable for feeding tube No surrounding erythema, moderate discomfort  Musculoskeletal: Normal range of motion.        General: No tenderness.  Skin:    General: Skin is warm and dry.  Neurological:     Mental Status: He is alert and oriented to person, place, and time.  Psychiatric:        Behavior: Behavior normal.        Thought Content: Thought content normal.        Judgment: Judgment normal.      ED Treatments / Results  Labs (all labs ordered are listed, but only abnormal results are displayed) Labs Reviewed  CBC WITH DIFFERENTIAL/PLATELET - Abnormal; Notable for the following components:      Result Value   WBC 14.5 (*)    RBC 6.65 (*)    HCT 54.0 (*)    MCH 24.2 (*)    MCHC 29.8 (*)    RDW 20.1 (*)    Neutro Abs 13.0 (*)    Lymphs Abs 0.1 (*)    Monocytes Absolute 1.2 (*)    Abs Immature Granulocytes 0.13 (*)    All other components within normal limits  COMPREHENSIVE METABOLIC PANEL - Abnormal; Notable for the following components:   Sodium 130 (*)    Potassium 5.2 (*)    Chloride 92 (*)    Glucose, Bld 766 (*)    BUN 67 (*)    Total Protein 8.5 (*)    Albumin 3.3 (*)    ALT 48 (*)    Alkaline Phosphatase 174 (*)    Total Bilirubin 1.7 (*)  All other components within normal limits  CBG MONITORING, ED - Abnormal; Notable for the following components:   Glucose-Capillary >600 (*)    All other components within normal limits  LIPASE, BLOOD    EKG None  Radiology Dg Chest 2 View  Result Date: 11/19/2018 CLINICAL DATA:  Stage IV esophageal cancer with spread to the lymph nodes. Elevated blood gas readings. EXAM:  CHEST - 2 VIEW COMPARISON:  None. FINDINGS: The heart size and mediastinal contours are within normal limits. Both lungs are clear. Port catheter tip terminates in distal SVC-RA juncture. The visualized skeletal structures are unremarkable. IMPRESSION: No active cardiopulmonary disease. Electronically Signed   By: Ashley Royalty M.D.   On: 11/19/2018 22:38    Procedures Procedures (including critical care time) CRITICAL CARE Performed by: Montine Circle  Significant hyperglycemia, blood glucose is 766, K is 5.2, treated with insulin infusion and requiring admission Total critical care time: 46 minutes  Critical care time was exclusive of separately billable procedures and treating other patients.  Critical care was necessary to treat or prevent imminent or life-threatening deterioration.  Critical care was time spent personally by me on the following activities: development of treatment plan with patient and/or surrogate as well as nursing, discussions with consultants, evaluation of patient's response to treatment, examination of patient, obtaining history from patient or surrogate, ordering and performing treatments and interventions, ordering and review of laboratory studies, ordering and review of radiographic studies, pulse oximetry and re-evaluation of patient's condition.  Medications Ordered in ED Medications  sodium chloride 0.9 % bolus 1,000 mL (has no administration in time range)  HYDROmorphone (DILAUDID) injection 1 mg (has no administration in time range)     Initial Impression / Assessment and Plan / ED Course  I have reviewed the triage vital signs and the nursing notes.  Pertinent labs & imaging results that were available during my care of the patient were reviewed by me and considered in my medical decision making (see chart for details).    Patient with esophageal cancer with mets to surrounding lymph nodes and likely to the liver, presents feeling dehydrated.  He  called his oncologist, and was told to come to the ED to get "tanked up."  Additionally, he states that his blood sugar has been running high.  He also states that he has had a cough.  Will check labs and chest x-ray.  Will give fluids.  Will reassess.  CMP shows glucose of 766, K is 5.2, no elevated anion gap.  We will continue with fluids.  Will start glucose infusion.  Patient will need admission to the hospital.  Patient discussed with Dr. Vanita Panda, who agrees with the plan.  Final Clinical Impressions(s) / ED Diagnoses   Final diagnoses:  Hyperglycemia  Hyperkalemia  Dehydration    ED Discharge Orders    None       Montine Circle, [name] 11/20/18 0009    Carmin Muskrat, MD 11/20/18 [phone]

## 2018-11-20 ENCOUNTER — Other Ambulatory Visit: Payer: Self-pay

## 2018-11-20 ENCOUNTER — Encounter (HOSPITAL_COMMUNITY): Payer: Self-pay

## 2018-11-20 DIAGNOSIS — C159 Malignant neoplasm of esophagus, unspecified: Secondary | ICD-10-CM

## 2018-11-20 DIAGNOSIS — N179 Acute kidney failure, unspecified: Secondary | ICD-10-CM

## 2018-11-20 DIAGNOSIS — E1101 Type 2 diabetes mellitus with hyperosmolarity with coma: Secondary | ICD-10-CM | POA: Diagnosis present

## 2018-11-20 DIAGNOSIS — R739 Hyperglycemia, unspecified: Secondary | ICD-10-CM

## 2018-11-20 DIAGNOSIS — Z66 Do not resuscitate: Secondary | ICD-10-CM

## 2018-11-20 DIAGNOSIS — R5381 Other malaise: Secondary | ICD-10-CM

## 2018-11-20 DIAGNOSIS — C155 Malignant neoplasm of lower third of esophagus: Secondary | ICD-10-CM

## 2018-11-20 DIAGNOSIS — E875 Hyperkalemia: Secondary | ICD-10-CM

## 2018-11-20 DIAGNOSIS — E1165 Type 2 diabetes mellitus with hyperglycemia: Secondary | ICD-10-CM

## 2018-11-20 DIAGNOSIS — E669 Obesity, unspecified: Secondary | ICD-10-CM

## 2018-11-20 LAB — BASIC METABOLIC PANEL
ANION GAP: 13 (ref 5–15)
Anion gap: 11 (ref 5–15)
Anion gap: 12 (ref 5–15)
Anion gap: 11 (ref 5–15)
BUN: 73 mg/dL — ABNORMAL HIGH (ref 6–20)
BUN: 71 mg/dL — ABNORMAL HIGH (ref 6–20)
BUN: 72 mg/dL — ABNORMAL HIGH (ref 6–20)
BUN: 76 mg/dL — ABNORMAL HIGH (ref 6–20)
CHLORIDE: 99 mmol/L (ref 98–111)
CO2: 28 mmol/L (ref 22–32)
CO2: 30 mmol/L (ref 22–32)
CO2: 30 mmol/L (ref 22–32)
CO2: 26 mmol/L (ref 22–32)
Calcium: 10.1 mg/dL (ref 8.9–10.3)
Calcium: 10 mg/dL (ref 8.9–10.3)
Calcium: 10 mg/dL (ref 8.9–10.3)
Calcium: 9.2 mg/dL (ref 8.9–10.3)
Chloride: 101 mmol/L (ref 98–111)
Chloride: 101 mmol/L (ref 98–111)
Chloride: 102 mmol/L (ref 98–111)
Creatinine, Ser: 1.14 mg/dL (ref 0.61–1.24)
Creatinine, Ser: 1.07 mg/dL (ref 0.61–1.24)
Creatinine, Ser: 1.11 mg/dL (ref 0.61–1.24)
Creatinine, Ser: 1.16 mg/dL (ref 0.61–1.24)
GFR calc Af Amer: >60 mL/min (ref >60)
GFR calc Af Amer: >60 mL/min (ref >60)
GFR calc Af Amer: >60 mL/min (ref >60)
GFR calc Af Amer: >60 mL/min (ref >60)
GFR calc non Af Amer: >60 mL/min (ref >60)
GFR calc non Af Amer: >60 mL/min (ref >60)
GFR calc non Af Amer: >60 mL/min (ref >60)
GFR calc non Af Amer: >60 mL/min (ref >60)
Glucose, Bld: 155 mg/dL — ABNORMAL HIGH (ref 70–99)
Glucose, Bld: 202 mg/dL — ABNORMAL HIGH (ref 70–99)
Glucose, Bld: 187 mg/dL — ABNORMAL HIGH (ref 70–99)
Glucose, Bld: 366 mg/dL — ABNORMAL HIGH (ref 70–99)
POTASSIUM: 5.5 mmol/L — AB (ref 3.5–5.1)
Potassium: 4.8 mmol/L (ref 3.5–5.1)
Potassium: 4.9 mmol/L (ref 3.5–5.1)
Potassium: 5.6 mmol/L — ABNORMAL HIGH (ref 3.5–5.1)
Sodium: 142 mmol/L (ref 135–145)
Sodium: 140 mmol/L (ref 135–145)
Sodium: 143 mmol/L (ref 135–145)
Sodium: 139 mmol/L (ref 135–145)

## 2018-11-20 LAB — GLUCOSE, CAPILLARY
GLUCOSE-CAPILLARY: 212 mg/dL — AB (ref 70–99)
GLUCOSE-CAPILLARY: 165 mg/dL — AB (ref 70–99)
Glucose-Capillary: 154 mg/dL — ABNORMAL HIGH (ref 70–99)
Glucose-Capillary: 155 mg/dL — ABNORMAL HIGH (ref 70–99)
Glucose-Capillary: 178 mg/dL — ABNORMAL HIGH (ref 70–99)
Glucose-Capillary: 188 mg/dL — ABNORMAL HIGH (ref 70–99)
Glucose-Capillary: 192 mg/dL — ABNORMAL HIGH (ref 70–99)
Glucose-Capillary: 242 mg/dL — ABNORMAL HIGH (ref 70–99)
Glucose-Capillary: 358 mg/dL — ABNORMAL HIGH (ref 70–99)
Glucose-Capillary: 404 mg/dL — ABNORMAL HIGH (ref 70–99)
Glucose-Capillary: 463 mg/dL — ABNORMAL HIGH (ref 70–99)
Glucose-Capillary: 575 mg/dL (ref 70–99)
Glucose-Capillary: >600 mg/dL (ref 70–99)

## 2018-11-20 LAB — CBC
HEMATOCRIT: 54.5 % — AB (ref 39.0–52.0)
HEMOGLOBIN: 16.2 g/dL (ref 13.0–17.0)
MCH: 23.9 pg — ABNORMAL LOW (ref 26.0–34.0)
MCHC: 29.7 g/dL — ABNORMAL LOW (ref 30.0–36.0)
MCV: 80.4 fL (ref 80.0–100.0)
Platelets: 232 10*3/uL (ref 150–400)
RBC: 6.78 MIL/uL — AB (ref 4.22–5.81)
RDW: 20.4 % — ABNORMAL HIGH (ref 11.5–15.5)
WBC: 11.6 10*3/uL — ABNORMAL HIGH (ref 4.0–10.5)
nRBC: 0 % (ref 0.0–0.2)

## 2018-11-20 LAB — MRSA PCR SCREENING: MRSA by PCR: NEGATIVE

## 2018-11-20 MED ORDER — INSULIN ASPART 100 UNIT/ML ~~LOC~~ SOLN
0.0000 [IU] | SUBCUTANEOUS | Status: DC
  Administered 2018-11-20: 5 [IU] via SUBCUTANEOUS
  Administered 2018-11-20 (×2): 3 [IU] via SUBCUTANEOUS
  Administered 2018-11-20 – 2018-11-21 (×2): 15 [IU] via SUBCUTANEOUS
  Administered 2018-11-21 (×2): 8 [IU] via SUBCUTANEOUS
  Administered 2018-11-21: 15 [IU] via SUBCUTANEOUS
  Administered 2018-11-21: 11 [IU] via SUBCUTANEOUS
  Administered 2018-11-21: 8 [IU] via SUBCUTANEOUS
  Administered 2018-11-22: 5 [IU] via SUBCUTANEOUS
  Administered 2018-11-22: 11 [IU] via SUBCUTANEOUS
  Administered 2018-11-22: 15 [IU] via SUBCUTANEOUS

## 2018-11-20 MED ORDER — JEVITY 1.2 CAL PO LIQD: 1000.0000 mL | ORAL | Status: DC

## 2018-11-20 MED ORDER — BENZONATATE 100 MG PO CAPS: 100.0000 mg | ORAL_CAPSULE | Freq: Three times a day (TID) | ORAL | Status: DC | PRN

## 2018-11-20 MED ORDER — INSULIN GLARGINE 100 UNIT/ML ~~LOC~~ SOLN
15.0000 [IU] | Freq: Every day | SUBCUTANEOUS | Status: DC
  Administered 2018-11-20: 15 [IU] via SUBCUTANEOUS
  Filled 2018-11-20: qty 0.15

## 2018-11-20 MED ORDER — INSULIN ASPART 100 UNIT/ML ~~LOC~~ SOLN
5.0000 [IU] | Freq: Once | SUBCUTANEOUS | Status: AC
  Administered 2018-11-20: 5 [IU] via SUBCUTANEOUS

## 2018-11-20 MED ORDER — FREE WATER
100.0000 mL | Status: DC
  Administered 2018-11-20 – 2018-11-22 (×13): 100 mL

## 2018-11-20 MED ORDER — CLONIDINE HCL 0.1 MG/24HR TD PTWK
0.1000 mg | MEDICATED_PATCH | TRANSDERMAL | Status: DC
  Administered 2018-11-20: 0.1 mg via TRANSDERMAL
  Filled 2018-11-20: qty 1

## 2018-11-20 MED ORDER — VITAL AF 1.2 CAL PO LIQD
1000.0000 mL | ORAL | Status: DC
  Administered 2018-11-20 – 2018-11-22 (×3): 1000 mL
  Filled 2018-11-20 (×3): qty 1000

## 2018-11-20 MED ORDER — ENOXAPARIN SODIUM 60 MG/0.6ML ~~LOC~~ SOLN
60.0000 mg | SUBCUTANEOUS | Status: DC
  Administered 2018-11-20 – 2018-11-22 (×3): 60 mg via SUBCUTANEOUS
  Filled 2018-11-20 (×4): qty 0.6

## 2018-11-20 MED ORDER — ONDANSETRON HCL 4 MG/5ML PO SOLN
4.0000 mg | Freq: Three times a day (TID) | ORAL | Status: DC | PRN
  Administered 2018-11-21: 4 mg
  Filled 2018-11-20 (×3): qty 5

## 2018-11-20 MED ORDER — INSULIN NPH (HUMAN) (ISOPHANE) 100 UNIT/ML ~~LOC~~ SUSP
8.0000 [IU] | Freq: Two times a day (BID) | SUBCUTANEOUS | Status: DC
  Filled 2018-11-20: qty 10

## 2018-11-20 MED ORDER — GLUCERNA 1.2 CAL PO LIQD: 1000.0000 mL | ORAL | Status: DC

## 2018-11-20 MED ORDER — OXYCODONE HCL 5 MG/5ML PO SOLN: 10.0000 mg | ORAL | Status: DC | PRN

## 2018-11-20 MED ORDER — CHLORHEXIDINE GLUCONATE 0.12 % MT SOLN
15.0000 mL | Freq: Two times a day (BID) | OROMUCOSAL | Status: DC
  Administered 2018-11-20 – 2018-11-21 (×3): 15 mL via OROMUCOSAL
  Filled 2018-11-20 (×3): qty 15

## 2018-11-20 MED ORDER — ORAL CARE MOUTH RINSE
15.0000 mL | Freq: Two times a day (BID) | OROMUCOSAL | Status: DC
  Administered 2018-11-20 – 2018-11-21 (×3): 15 mL via OROMUCOSAL

## 2018-11-20 MED ORDER — LORAZEPAM 0.5 MG PO TABS: 0.5000 mg | ORAL_TABLET | Freq: Four times a day (QID) | ORAL | Status: DC | PRN

## 2018-11-20 MED ORDER — HYDROMORPHONE HCL 1 MG/ML IJ SOLN
1.0000 mg | INTRAMUSCULAR | Status: DC | PRN
  Administered 2018-11-20 – 2018-11-22 (×17): 1 mg via INTRAVENOUS
  Filled 2018-11-20 (×17): qty 1

## 2018-11-20 MED ORDER — SCOPOLAMINE 1 MG/3DAYS TD PT72
1.0000 | MEDICATED_PATCH | TRANSDERMAL | Status: DC
  Administered 2018-11-21: 1.5 mg via TRANSDERMAL
  Filled 2018-11-20: qty 1

## 2018-11-20 NOTE — Progress Notes (Signed)
Pt.'s CBG was 404 and axillary temperature was 93.5 degrees F. Provider notified. Awaiting orders. Will continue to monitor pt.  Mariann Laster, RN

## 2018-11-20 NOTE — Progress Notes (Signed)
Rechecked temp rectally and received a reading of 99.4 degrees F. Warming blanket will not be needed at this time.  Mariann Laster, RN

## 2018-11-20 NOTE — H&P (Signed)
History and Physical  Darrell Sutton IRW:[id number] DOB: 1961-01-21 DOA: 11/19/2018 2143  Referring physician: Andris Baumann [facility] ED) PCP: [name], PA  Outpatient Specialists: Ky Barban (oncology)  HISTORY   Chief Complaint: dehydration and elevated blood sugars at home  HPI: Darrell Sutton is a 58 y.o. 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 Patient reports that over the past few days, he has been noting elevated blood sugars higher than 300s and states that he subjectively feels dehydrated. Having abdominal pain (R sided and across upper abdomen) since biliary drain placement, which has remained stable and not worsened over the past few days. No fevers or chills. Non productive intermittent cough, unchanged for month or so.    Review of Systems:  - no fevers/chills - unchanged dry cough - no chest pain, dyspnea on exertion - no edema, PND, orthopnea - no nausea/vomiting; no tarry, melanotic or bloody stools - no dysuria, increased urinary frequency - no weight changes Rest of systems reviewed are negative, except as per above history.   ED course:  Vitals Blood pressure (!) 143/99, pulse (!) 116, temperature (!) 96 F (35.6 C), temperature source Oral, resp. rate 16, height 6' (1.829 m), weight 120.2 kg, SpO2 95 %. Received insulin drip / glucostabilizer and NS bolus x 2L.   Past Medical History:  Diagnosis Date  . Anxiety 2009   started with divorce  . Cancer [facility])    cancer of lower third of esophagus  . Diabetes mellitus without complication ([place])   . Gastric ulcer    from NSAID overuse  . GERD (gastroesophageal reflux disease)   . Hyperlipidemia   . Hypertension   . MI (myocardial infarction) ([place]) 2011   has two stents  . Obesity    highest weight in 500's   Past  Surgical History:  Procedure Laterality Date  . [place] SURGERY  2010  . core biopsy  10/11/2018   core biopsy of lymph nodes in Interventional Radiology  . GASTRIC BYPASS  11/24/2008  . GASTROJEJUNOSTOMY N/A 10/17/2018   Procedure: LAPAROSCOPIC PLACEMENT OF  LEFT FEEDING JEJUNOSTOMY TUBE;  Surgeon: Michael Boston, MD;  Location: [facility] ORS;  Service: General;  Laterality: N/A;  . IR INT EXT BILIARY DRAIN WITH CHOLANGIOGRAM  11/09/2018  . JOINT REPLACEMENT     Bilateral knee  . PORTACATH PLACEMENT Right 10/17/2018   Procedure: INSERTION PORT-A-CATH WITH ULTRASOUND AND FLUORO;  Surgeon: Michael Boston, MD;  Location: [facility] ORS;  Service: General;  Laterality: Right;  . TONSILECTOMY/ADENOIDECTOMY WITH MYRINGOTOMY      Social History:  reports that he has never smoked. He has never used smokeless tobacco. He reports previous drug use. Drug: Marijuana. He reports that he does not drink alcohol.  Allergies  Allergen Reactions  . Penicillins Hives    Has patient had a PCN reaction causing immediate rash, facial/tongue/throat swelling, SOB or lightheadedness with hypotension: Yes Has patient had a PCN reaction causing severe rash involving mucus membranes or skin necrosis: No Has patient had a PCN reaction that required hospitalization: No; was already in hosp Has patient had a PCN reaction occurring within the last 10 years: No If all of the above answers are "NO", then may proceed with Cephalosporin use.     Family History  Problem Relation Age of Onset  . Hyperlipidemia Mother   .  Hypertension Mother   . Hyperlipidemia Father   . Hypertension Father   . Stroke Father   . Cancer Father        NHL  . Diabetes Brother   . Heart attack Brother 39  . Hyperlipidemia Brother   . Hypertension Brother   . Heart attack Maternal Grandmother   . Heart attack Maternal Grandfather   . Early death Maternal Grandfather   . Esophageal cancer Paternal Grandmother   . Colon cancer Paternal Grandfather    . Colon cancer Cousin   . Stomach cancer Neg Hx       Prior to Admission medications   Medication Sig Start Date End Date Taking? Authorizing Provider  bisacodyl (DULCOLAX) 10 MG suppository Place 1 suppository (10 mg total) rectally as needed for moderate constipation. 11/11/18  Yes Aline August, MD  cloNIDine (CATAPRES - DOSED IN MG/24 HR) 0.1 mg/24hr patch Place 1 patch (0.1 mg total) onto the skin once a week. 10/31/18  Yes Tanner, Lyndon Code., [name]  HYDROcodone-acetaminophen (HYCET) 7.5-325 mg/15 ml solution Take 15 mLs by mouth every 6 (six) hours as needed for moderate pain. 11/12/18  Yes Joy, Shawn C, [name]  insulin aspart protamine- aspart (NOVOLOG MIX 70/30) (70-30) 100 UNIT/ML injection Inject 0.07 mLs (7 Units total) into the skin 2 (two) times daily with a meal. 10/31/18  Yes Tanner, Lyndon Code., [name]  insulin regular (NOVOLIN R,HUMULIN R) 100 units/mL injection For glucose 151 to 200 use 2 units, for 201 to 250 use 5 units, for 251-300 use 7 units, for 301-350 use 9 units, for 351 to 400 use 12 units. Patient taking differently: Inject 2-12 Units into the skin 2 (two) times daily before a meal. For glucose 151 to 200 use 2 units, for 201 to 250 use 5 units, for 251-300 use 7 units, for 301-350 use 9 units, for 351 to 400 use 12 units. 10/31/18 10/31/19 Yes Tanner, Lyndon Code., [name]  LORazepam (ATIVAN) 0.5 MG tablet Place 1 tablet (0.5 mg total) under the tongue every 6 (six) hours as needed for anxiety. 10/31/18  Yes Tanner, Lyndon Code., [name]  Nutritional Supplements (FEEDING SUPPLEMENT, GLUCERNA 1.5 CAL,) LIQD Place 1,000 mLs into feeding tube continuous. Start @ 49ml/hr, increase by 63ml every 8 hours to a goal of 73ml/hr 11/11/18  Yes Aline August, MD  ondansetron (ZOFRAN) 4 MG/5ML solution Place 5 mLs (4 mg total) into feeding tube every 8 (eight) hours as needed for nausea or vomiting. 10/25/18  Yes [name], PA  scopolamine (TRANSDERM-SCOP) 1 MG/3DAYS Place 1 patch (1.5 mg total) onto the  skin every 3 (three) days. 10/31/18  Yes Harle Stanford., [name]  Syringe, Disposable, 3 ML MISC Insulin syringes and needles. 10/18/18  Yes Arrien, Jimmy Picket, MD  hydrochlorothiazide (HYDRODIURIL) 25 MG tablet CRUSH 1 TABLET AND ADMINISTER PER TUBE EVERY MORNING Patient not taking: Reported on 11/19/2018 11/17/18   [name], PA  metoprolol tartrate (LOPRESSOR) 25 MG tablet CRUSH 1 TABLET AND ADMINISTER VIA TUBE TWICE A DAY Patient not taking: Reported on 11/19/2018 11/13/18   [name], PA  polyethylene glycol (MIRALAX / GLYCOLAX) packet 17 g by Per J Tube route daily as needed for mild constipation. Patient not taking: Reported on 109/04/202019 11/11/18   Aline August, MD    PHYSICAL EXAM   Temp:  [96 F (35.6 C)] 96 F (35.6 C) (01/05 2154) Pulse Rate:  [116] 116 (01/05 2154) Resp:  [16] 16 (01/05 2154) BP: (143)/(99) 143/99 (01/05 2154) SpO2:  [  95 %] 95 % (01/05 2154) Weight:  [120.2 kg] 120.2 kg (01/05 2154)  BP (!) 143/99 (BP Location: Right Arm)   Pulse (!) 116   Temp (!) 96 F (35.6 C) (Oral) Comment: Pt uses sterile water to rinse and spit frequently  Resp 16   Ht 6' (1.829 m)   Wt 120.2 kg   SpO2 95%   BMI 35.94 kg/m    GEN well-nourished elderly caucasian male; resting in bed  HEENT NCAT alopecia; EOM intact PERRL; clear oropharynx, no cervical LAD; dry mucus membranes;  JVP estimated 4 cm H2O above RA; no HJR ; no carotid bruits b/l ;  CV regular normal rate; normal S1 and S2; no m/r/g or S3/S4; no parasternal heave  R chest port in place; dressing clean/dry/intact RESP CTA b/l; breathing unlabored and symmetric  ABD soft NT ND +normoactive BS; jejunostomy tube in place; biliary drain in RUQ clean/dry/intact EXT warm throughout b/l; no peripheral edema b/l  PULSES  DP and radials 2+ intact b/l  SKIN/MSK no rashes or lesions  NEURO/PSYCH AAOx4; no focal deficits   DATA   LABS ON ADMISSION:  Basic Metabolic Panel: Recent Labs  Lab 11/19/18 2316    NA 130*  K 5.2*  CL 92*  CO2 26  GLUCOSE 766*  BUN 67*  CREATININE 1.19  CALCIUM 9.9   CBC: Recent Labs  Lab 11/19/18 2316  WBC 14.5*  NEUTROABS 13.0*  HGB 16.1  HCT 54.0*  MCV 81.2  PLT 299   Liver Function Tests: Recent Labs  Lab 11/19/18 2316  AST 22  ALT 48*  ALKPHOS 174*  BILITOT 1.7*  PROT 8.5*  ALBUMIN 3.3*   Recent Labs  Lab 11/19/18 2316  LIPASE 51   No results for input(s): AMMONIA in the last 168 hours. Coagulation:  Lab Results  Component Value Date   INR 1.16 11/08/2018   INR 1.11 11/07/2018   INR 1.08 10/11/2018   No results found for: PTT Lactic Acid, Venous:  No results found for: LATICACIDVEN Cardiac Enzymes: No results for input(s): CKTOTAL, CKMB, CKMBINDEX, TROPONINI in the last 168 hours. Urinalysis: No results found for: COLORURINE, APPEARANCEUR, LABSPEC, PHURINE, GLUCOSEU, HGBUR, BILIRUBINUR, KETONESUR, PROTEINUR, UROBILINOGEN, NITRITE, LEUKOCYTESUR  BNP (last 3 results) No results for input(s): PROBNP in the last 8760 hours. CBG: Recent Labs  Lab 11/19/18 2200  GLUCAP >600*    Radiological Exams on Admission: Dg Chest 2 View  Result Date: 11/19/2018 CLINICAL DATA:  Stage IV esophageal cancer with spread to the lymph nodes. Elevated blood gas readings. EXAM: CHEST - 2 VIEW COMPARISON:  None. FINDINGS: The heart size and mediastinal contours are within normal limits. Both lungs are clear. Port catheter tip terminates in distal SVC-RA juncture. The visualized skeletal structures are unremarkable. IMPRESSION: No active cardiopulmonary disease. Electronically Signed   By: Ashley Royalty M.D.   On: 11/19/2018 22:38    EKG: Independently reviewed from prior EKG 08/23/18 NSR with nonspecific T wave abnl laterally  I have reviewed the patient's previous electronic chart records, labs, and other data.   ASSESSMENT AND PLAN   Assessment: DAVIDMICHAEL ZARAZUA is a 58 y.o. male with metastatic esophageal cancer who presented from home with  reported elevated blood glucose readings and concern for dehydration. Sugars in 700s on admission; other labs consistent with dehydration. Likely more related to dehydration more so than under-dosed insulin regimen (since he had only been administering insulin BID), although he probably would benefit from long acting insulin as well.  Admit as observation overnight for insulin drip and plan for nutrition re-assessment for enteral needs and free water requirement.     Active Problems:   Hyperglycemic hyperosmolar nonketotic coma ([place])   Plan:    # Hyperglycemia and concern for HHS - likely combination of dehydration and under-dosed insulin > sugars in 700s on admission - insulin drip and IVF with transition to D5-1/2NS per protocol - q1h fingersticks while on drip - q4h BMP  - nutrition consult re: glucerna regimen and free water requirement - continue IVF until nutrition consult - consider starting lantus at ~15 units in AM pending sugars  # AKI initial Cr 1.19 - likely pre-renal  > baseline Cr less than 0.8-1.0 - expect improvement with IVF as above - repeat AM labs  # IDDM > at home patient using sliding scale BID (2-12 units) - consider starting lantus in AM - ultimate insulin requirement depends feeding regimen  # Metastatic esophageal CA s/p feeding tube and recent biliary drain  - dilaudid IV 1mg  q3h prn pain - liquid oxycodone via feeding tube q4 prn pain - scop patch to start tomorrow - zofran liquid via feeding tube prn nausea - resume home ativan 0.5mg  oral (crush under tongue) q6h prn anxiety/nausea  # HTN at home on clonidine patch and metoprolol and HCTZ > BP in 160-170s on admission  - permissive HTN to systolic 800L  - replace clonidine patch in AM (per pharmacy recs, estimate due 1/7)  - hold off HCTZ (consider discontinuing)  - holding metoprolol for now (at home via J tube)  DVT Prophylaxis: lovenox Code Status:  DNR Family Communication: discussed plan  with patient  Disposition Plan: observation for hyperglycemia control; pending nutrition coinsult   Patient contact: Extended Emergency Contact Information Primary Emergency Contact: [facility] Address: [address]          [place], [place] [address] [place] Phone: [phone] Mobile Phone: [phone] Relation: Spouse  Time spent: > 35 mins  Colbert Ewing, MD [facility] Pager [phone]  If 7PM-7AM, please contact night-coverage [email or web address] Password [facility] 11/20/2018, 12:32 AM

## 2018-11-20 NOTE — Progress Notes (Signed)
Darrell Sutton   DOB:September 28, 1961   GD#:[id number]   DQQ#:[id number]  Oncology follow-up note  Subjective: Patient is known to me, under my care for his metastatic esophageal cancer.  He was recently hospitalized for obstructive jaundice, status post percutaneous biliary draining tube placement.  He was readmitted yesterday for dehydration and hypoglycemia with blood glucose in 700's.  He feels quite fatigued, drowsy when I saw him in the stepdown unit.    Objective:  Vitals:   11/20/18 1100 11/20/18 1200  BP: (!) 145/92 (!) 153/100  Pulse: 99 98  Resp: 11 10  Temp:    SpO2: 94% 93%    Body mass index is 35.94 kg/m.  Intake/Output Summary (Last 24 hours) at 11/20/2018 1248 Last data filed at 11/20/2018 1027 Gross per 24 hour  Intake 1931.96 ml  Output 800 ml  Net 1131.96 ml     Sclerae unicteric  Oropharynx clear  No peripheral adenopathy  Lungs clear -- no rales or rhonchi  Heart regular rate and rhythm  Abdomen soft, (+) biliary drain in the right upper quadrant  MSK no focal spinal tenderness, no peripheral edema  Neuro nonfocal    CBG (last 3)  Recent Labs    11/20/18 0915 11/20/18 1014 11/20/18 1124  GLUCAP 192* 165* 154*     Labs:  Lab Results  Component Value Date   WBC 11.6 (H) 11/20/2018   HGB 16.2 11/20/2018   HCT 54.5 (H) 11/20/2018   MCV 80.4 11/20/2018   PLT 232 11/20/2018   NEUTROABS 13.0 (H) 11/19/2018    CMP Latest Ref Rng & Units 11/20/2018 11/20/2018 11/20/2018  Glucose 70 - 99 mg/dL 187(H) 202(H) 155(H)  BUN 6 - 20 mg/dL 72(H) 71(H) 73(H)  Creatinine 0.61 - 1.24 mg/dL 1.11 1.07 1.14  Sodium 135 - 145 mmol/L 143 140 142  Potassium 3.5 - 5.1 mmol/L 5.5(H) 4.9 4.8  Chloride 98 - 111 mmol/L 101 99 101  CO2 22 - 32 mmol/L 30 30 28   Calcium 8.9 - 10.3 mg/dL 10.0 10.0 10.1  Total Protein 6.5 - 8.1 g/dL - - -  Total Bilirubin 0.3 - 1.2 mg/dL - - -  Alkaline Phos 38 - 126 U/L - - -  AST 15 - 41 U/L - - -  ALT 0 - 44 U/L - - -     Urine  Studies No results for input(s): UHGB, CRYS in the last 72 hours.  Invalid input(s): UACOL, UAPR, USPG, UPH, UTP, UGL, UKET, UBIL, UNIT, UROB, [place], UEPI, UWBC, Duwayne Heck [place], [place]  Basic Metabolic Panel: Recent Labs  Lab 11/19/18 2316 11/20/18 0617 11/20/18 0800 11/20/18 1135  NA 130* 142 140 143  K 5.2* 4.8 4.9 5.5*  CL 92* 101 99 101  CO2 26 28 30 30   GLUCOSE 766* 155* 202* 187*  BUN 67* 73* 71* 72*  CREATININE 1.19 1.14 1.07 1.11  CALCIUM 9.9 10.1 10.0 10.0   GFR Estimated Creatinine Clearance: 98.2 mL/min (by C-G formula based on SCr of 1.11 mg/dL). Liver Function Tests: Recent Labs  Lab 11/19/18 2316  AST 22  ALT 48*  ALKPHOS 174*  BILITOT 1.7*  PROT 8.5*  ALBUMIN 3.3*   Recent Labs  Lab 11/19/18 2316  LIPASE 51   No results for input(s): AMMONIA in the last 168 hours. Coagulation profile No results for input(s): INR, PROTIME in the last 168 hours.  CBC: Recent Labs  Lab 11/19/18 2316 11/20/18 0400  WBC 14.5* 11.6*  NEUTROABS 13.0*  --  HGB 16.1 16.2  HCT 54.0* 54.5*  MCV 81.2 80.4  PLT 299 232   Cardiac Enzymes: No results for input(s): CKTOTAL, CKMB, CKMBINDEX, TROPONINI in the last 168 hours. BNP: Invalid input(s): POCBNP CBG: Recent Labs  Lab 11/20/18 0725 11/20/18 0822 11/20/18 0915 11/20/18 1014 11/20/18 1124  GLUCAP 178* 188* 192* 165* 154*   D-Dimer No results for input(s): DDIMER in the last 72 hours. Hgb A1c No results for input(s): HGBA1C in the last 72 hours. Lipid Profile No results for input(s): CHOL, HDL, LDLCALC, TRIG, CHOLHDL, LDLDIRECT in the last 72 hours. Thyroid function studies No results for input(s): TSH, T4TOTAL, T3FREE, THYROIDAB in the last 72 hours.  Invalid input(s): FREET3 Anemia work up No results for input(s): VITAMINB12, FOLATE, FERRITIN, TIBC, IRON, RETICCTPCT in the last 72 hours. Microbiology Recent Results (from the past 240 hour(s))  MRSA PCR Screening     Status: None    Collection Time: 11/20/18  1:41 AM  Result Value Ref Range Status   MRSA by PCR NEGATIVE NEGATIVE Final    Comment:        The GeneXpert MRSA Assay (FDA approved for NASAL specimens only), is one component of a comprehensive MRSA colonization surveillance program. It is not intended to diagnose MRSA infection nor to guide or monitor treatment for MRSA infections. Performed at [facility], [place] [address] [place], [place] [address]       Studies:  Dg Chest 2 View  Result Date: 11/19/2018 CLINICAL DATA:  Stage IV esophageal cancer with spread to the lymph nodes. Elevated blood gas readings. EXAM: CHEST - 2 VIEW COMPARISON:  None. FINDINGS: The heart size and mediastinal contours are within normal limits. Both lungs are clear. Port catheter tip terminates in distal SVC-RA juncture. The visualized skeletal structures are unremarkable. IMPRESSION: No active cardiopulmonary disease. Electronically Signed   By: Ashley Royalty M.D.   On: 11/19/2018 22:38    Assessment: 58 y.o. with recently diagnosed metastatic esophageal cancer, status post palliative radiation, recently hospitalized for obstructive jaundice, status post biliary drain, was admitted for hypoglycemia and dehydration  1.  Diabetes with severe hyperglycemia 2.  AKI and dehydration, improved  3.  Metastatic esophageal adenocarcinoma, status post palliative radiation 4.  Obstructive jaundice, status post percutaneous biliary drain on 11/09/18  5. Deconditioning  6. Code status DNR/DNI  7. Obesity    Plan:  -His hypoglycemia has significantly improved with insulin -We discussed his dehydration is probably from hypoglycemia and high.  Output -Patient is concerned about his quality of life, and how much time he has left.  He has agreed DNR and DNI, his wife is his power of attorney.  I supported to his decision -Patient also asked about hospice.  Due to his young age, and has not had chemotherapy, I encouraged  him to consider palliative chemotherapy to prolong his life, if his overall condition improves.  Patient feels encouraged, and agrees with the plan.  He is also realistic, if his overall condition continue declining, and not a candidate for chemotherapy, he is willing to take palliative care and hospice. -I have spoke with his wife on the phone when I was visiting him also, and answered her questions. -I agree with long acting insulin, he knows to monitor his BG at home  -I will f/u as needed in the hospital. He has f/u appointment with me in my clinic on 1/9.    Truitt Merle, MD 11/20/2018  12:48 PM

## 2018-11-20 NOTE — Progress Notes (Signed)
PROGRESS NOTE    MARKEIS ALLMAN  TZG:[id number] DOB: 01/21/1961 DOA: 11/19/2018 PCP: [name], PA    Brief Narrative:  58 y.o. 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 Patient reports that over the past few days, he has been noting elevated blood sugars higher than 300s and states that he subjectively feels dehydrated. Having abdominal pain (R sided and across upper abdomen) since biliary drain placement, which has remained stable and not worsened over the past few days. No fevers or chills. Non productive intermittent cough, unchanged for month or so.   Assessment & Plan:   Active Problems:   Cancer of lower third of esophagus (HCC)   Hyperglycemic hyperosmolar nonketotic coma (HCC)   Hyperglycemia   # Hyperglycemia and concern for HHS - likely combination of dehydration and under-dosed insulin after initiating tube feeding -transitioned off insulin gtt this AM with 15 units lantus -Discussed with Diabetic Coordinator with recommendation to continue on BID NPH 8 units starting 1/7 AM with continued SSI coverage  # AKI initial Cr 1.19 - likely pre-renal  - baseline Cr less than 1 -Given IVF overnight. Improved  # IDDM -Per above, on SSI coverage with 70/30 insulin -Insulin plan per above  # Metastatic esophageal CA s/p feeding tube and recent biliary drain  - continue dilaudid IV 1mg  q3h prn pain - liquid oxycodone via feeding tube q4 prn pain - zofran liquid via feeding tube prn nausea - resumed home ativan 0.5mg  oral (crush under tongue) q6h prn anxiety/nausea  # HTN at home on clonidine patch and metoprolol and HCTZ -BP in 160-170s on admission -continue clonidine patch -diuretics on hold per above arf - holding metoprolol for now (at home via J tube). Per med rec, pt has  not been taking   DVT prophylaxis: Lovenox subQ Code Status: DNR Family Communication: Pt in room, family not at bedside Disposition Plan: Possible d/c home in 24 hrs if stable  Consultants:     Procedures:     Antimicrobials: Anti-infectives (From admission, onward)   None       Subjective: Coughing this AM  Objective: Vitals:   11/20/18 1500 11/20/18 1600 11/20/18 1700 11/20/18 1729  BP: (!) 139/99 (!) 126/95 (!) 145/106   Pulse: 100 98 (!) 109 97  Resp: 12 11 17 11   Temp:  (!) 97.5 F (36.4 C)    TempSrc:  Oral    SpO2: 93% 94% (!) 85% 94%  Weight:      Height:        Intake/Output Summary (Last 24 hours) at 11/20/2018 1734 Last data filed at 11/20/2018 1400 Gross per 24 hour  Intake 2367.17 ml  Output 800 ml  Net 1567.17 ml   Filed Weights   11/19/18 2154  Weight: 120.2 kg    Examination:  General exam: Appears calm and comfortable  Respiratory system: Clear to auscultation. Respiratory effort normal. Cardiovascular system: S1 & S2 heard, RRR Gastrointestinal system: Abdomen is nondistended, soft and nontender. No organomegaly or masses felt. Normal bowel sounds heard. Central nervous system: Alert and oriented. No focal neurological deficits. Extremities: Symmetric 5 x 5 power. Skin: No rashes, lesions  Psychiatry: Judgement and insight appear normal. Mood & affect appropriate.   Data Reviewed: I have personally reviewed following labs and imaging studies  CBC: Recent Labs  Lab 11/19/18 2316 11/20/18 0400  WBC 14.5* 11.6*  NEUTROABS 13.0*  --   HGB 16.1 16.2  HCT 54.0* 54.5*  MCV 81.2 80.4  PLT 299 202   Basic Metabolic Panel: Recent Labs  Lab 11/19/18 2316 11/20/18 0617 11/20/18 0800 11/20/18 1135 11/20/18 1520  NA 130* 142 140 143 139  K 5.2* 4.8 4.9 5.5* 5.6*  CL 92* 101 99 101 102  CO2 26 28 30 30 26   GLUCOSE 766* 155* 202* 187* 366*  BUN 67* 73* 71* 72* 76*  CREATININE 1.19 1.14 1.07 1.11 1.16  CALCIUM 9.9 10.1 10.0  10.0 9.2   GFR: Estimated Creatinine Clearance: 94 mL/min (by C-G formula based on SCr of 1.16 mg/dL). Liver Function Tests: Recent Labs  Lab 11/19/18 2316  AST 22  ALT 48*  ALKPHOS 174*  BILITOT 1.7*  PROT 8.5*  ALBUMIN 3.3*   Recent Labs  Lab 11/19/18 2316  LIPASE 51   No results for input(s): AMMONIA in the last 168 hours. Coagulation Profile: No results for input(s): INR, PROTIME in the last 168 hours. Cardiac Enzymes: No results for input(s): CKTOTAL, CKMB, CKMBINDEX, TROPONINI in the last 168 hours. BNP (last 3 results) No results for input(s): PROBNP in the last 8760 hours. HbA1C: No results for input(s): HGBA1C in the last 72 hours. CBG: Recent Labs  Lab 11/20/18 0822 11/20/18 0915 11/20/18 1014 11/20/18 1124 11/20/18 1508  GLUCAP 188* 192* 165* 154* 242*   Lipid Profile: No results for input(s): CHOL, HDL, LDLCALC, TRIG, CHOLHDL, LDLDIRECT in the last 72 hours. Thyroid Function Tests: No results for input(s): TSH, T4TOTAL, FREET4, T3FREE, THYROIDAB in the last 72 hours. Anemia Panel: No results for input(s): VITAMINB12, FOLATE, FERRITIN, TIBC, IRON, RETICCTPCT in the last 72 hours. Sepsis Labs: No results for input(s): PROCALCITON, LATICACIDVEN in the last 168 hours.  Recent Results (from the past 240 hour(s))  MRSA PCR Screening     Status: None   Collection Time: 11/20/18  1:41 AM  Result Value Ref Range Status   MRSA by PCR NEGATIVE NEGATIVE Final    Comment:        The GeneXpert MRSA Assay (FDA approved for NASAL specimens only), is one component of a comprehensive MRSA colonization surveillance program. It is not intended to diagnose MRSA infection nor to guide or monitor treatment for MRSA infections. Performed at [facility], [place] [address] [place], [place] [address]      Radiology Studies: Dg Chest 2 View  Result Date: 11/19/2018 CLINICAL DATA:  Stage IV esophageal cancer with spread to the lymph nodes.  Elevated blood gas readings. EXAM: CHEST - 2 VIEW COMPARISON:  None. FINDINGS: The heart size and mediastinal contours are within normal limits. Both lungs are clear. Port catheter tip terminates in distal SVC-RA juncture. The visualized skeletal structures are unremarkable. IMPRESSION: No active cardiopulmonary disease. Electronically Signed   By: Ashley Royalty M.D.   On: 11/19/2018 22:38    Scheduled Meds: . chlorhexidine  15 mL Mouth Rinse BID  . cloNIDine  0.1 mg Transdermal Weekly  . enoxaparin (LOVENOX) injection  60 mg Subcutaneous Q24H  . free water  100 mL Per Tube Q4H  . insulin aspart  0-15 Units Subcutaneous Q4H  . [START ON 11/21/2018] insulin NPH Human  8 Units Subcutaneous BID AC & HS  . mouth rinse  15 mL Mouth Rinse q12n4p  . [START ON 11/21/2018] scopolamine  1 patch Transdermal Q72H   Continuous Infusions: . sodium chloride     And  .  sodium chloride    . feeding supplement (VITAL AF 1.2 CAL) 80 mL/hr at 11/20/18 1400  . insulin Stopped (11/20/18 1028)     LOS: 0 days   Marylu Lund, MD [facility] Pager On Amion  If 7PM-7AM, please contact night-coverage 11/20/2018, 5:34 PM

## 2018-11-20 NOTE — Progress Notes (Signed)
Initial Nutrition Assessment  DOCUMENTATION CODES:   Non-severe (moderate) malnutrition in context of chronic illness, Obesity unspecified  INTERVENTION:  - Will order TF: Vital AF 1.2 @ 80 ml/hr via J-tube with 100 mL free water every 4 hours via G-tube. This regimen will provide 2304 kcal (96% estimated kcal need), 144 grams of protein, 212 grams of carb, and 2157 mL free water.    NUTRITION DIAGNOSIS:   Moderate Malnutrition related to chronic illness, cancer and cancer related treatments as evidenced by mild fat depletion, mild muscle depletion.  GOAL:   Patient will meet greater than or equal to 90% of their needs  MONITOR:   TF tolerance, Weight trends, Labs, I & O's  REASON FOR ASSESSMENT:   Malnutrition Screening Tool, Consult Enteral/tube feeding initiation and management  ASSESSMENT:   58 y.o. male with metastatic esophageal cancer s/p feeding J-tube placement, liver metastases causing biliary obstruction s/p recent biliary drain. He presented from home with reported elevated blood glucose readings and concern for dehydration. Patient reported that over the past few days he has been noting elevated blood sugars >300s and stated that he feels dehydrated. Having abdominal pain (R sided and across upper abdomen) since biliary drain placement, which has remained stable and not worsened over the past few days. Non productive intermittent cough, unchanged for month or so.  Patient has been NPO since admission. Patient last seen by a RD on 12/26. Patient reports that TF has remained the same: Glucerna 1.5 @ 70 ml/hr x24 hours. He will turn it off for a few minutes to use the bathroom. This regimen provides 2520 kcal, 139 grams of protein, 223 grams of carb, and 1275 mL free water.   Talked with Pharmacist earlier this AM. Glucerna 1.5 is not available. Will order TF as outlined above.   Patient reports that he has abdominal pain around his diaphragm which he attributes to  biliary drain. He denies any other abdominal pain or every having discomfort with receiving TF. He denies nausea at this time.   At home he would try to consume ice chips or clear liquids but stated he would be unable to fully swallow and that liquids would get "frothy" in his esophagus and he would need to cough it back up. Patient states that surgeon had informed him that it would "be a long and difficult road" to being able to take PO items again.   Per chart review, current weight is 265 lb and weight on 10/31/18 was 282 lb. This indicates 17 lb weight loss (6% body weight) in the past 3 weeks. Notes indicate that Lasix had previously been stopped but that it was recently restarted. Will monitor weight trends closely.    Medications reviewed; sliding scale Novolog, 15 units Lantus/day. Labs reviewed; CBGs: 165->600 mg/dL since 0155, BUN: 75 mg/dL. IVF; D5-1/2 NS @ 125 mL/hr (510 kcal).     NUTRITION - FOCUSED PHYSICAL EXAM:    Most Recent Value  Orbital Region  No depletion  Upper Arm Region  Mild depletion  Thoracic and Lumbar Region  Unable to assess  Buccal Region  Mild depletion  Temple Region  Mild depletion  Clavicle Bone Region  No depletion  Clavicle and Acromion Bone Region  Mild depletion  Scapular Bone Region  No depletion  Dorsal Hand  No depletion  Patellar Region  Unable to assess  Anterior Thigh Region  Unable to assess  Posterior Calf Region  Unable to assess  Edema (RD Assessment)  Unable to  assess  Hair  Reviewed  Eyes  Reviewed  Mouth  Reviewed  Skin  Reviewed  Nails  Reviewed       Diet Order:   Diet Order    None      EDUCATION NEEDS:   No education needs have been identified at this time  Skin:  Skin Assessment: Reviewed RN Assessment  Last BM:  PTA/unknown  Height:   Ht Readings from Last 1 Encounters:  11/19/18 6' (1.829 m)    Weight:   Wt Readings from Last 1 Encounters:  11/19/18 120.2 kg    Ideal Body Weight:  80.91  kg  BMI:  Body mass index is 35.94 kg/m.  Estimated Nutritional Needs:   Kcal:  1771-1657 kcal  Protein:  125-140 grams  Fluid:  >/= 2.4 L/day     Jarome Matin, MS, RD, LDN, [facility] Inpatient Clinical Dietitian Pager # [phone] After hours/weekend pager # [phone]

## 2018-11-20 NOTE — Progress Notes (Signed)
Inpatient Diabetes Program Recommendations  AACE/ADA: New Consensus Statement on Inpatient Glycemic Control (2015)  Target Ranges:  Prepandial:   less than 140 mg/dL      Peak postprandial:   less than 180 mg/dL (1-2 hours)      Critically ill patients:  140 - 180 mg/dL   Lab Results  Component Value Date   GLUCAP 154 (H) 11/20/2018   HGBA1C 8.4 (H) 10/17/2018    Review of Glycemic Control  Diabetes history: DM2 Outpatient Diabetes medications: Novolog 70/30 7 units bid, Humulin R 2-12 units s/s Current orders for Inpatient glycemic control: Lantus 15 units QD, Novolog 0-15 units Q4H.  Spoke with pt regarding his glucose control at home. States he's only checking blood sugars 2x/day and "they're always high." Pt states wife has been telling him when and how much insulin to give himself since he's been feeling poorly lately. Will speak with wife later this afternoon or in am. Pt reports no hypoglycemia at home.  On TFs (Vital AF 1.2 cal) @ 80/hour.   Inpatient Diabetes Program Recommendations:     Agree with orders at present.   Will speak with pt and wife about NPH bid and Humulin R Q4-6H. Pt does not have insurance and needs affordable insulin. NPH in bid dosing to replace Lantus. Regular insulin in place of Novolog. Also discussed monitoring blood sugars more often - at least 4x/day (Q6H) and following s/s with Regular insulin.  Continue to follow. Will meet with wife either later this afternoon or in am when she's available.   Thank you. Lorenda Peck, RD, LDN, CDE Inpatient Diabetes Coordinator [phone]

## 2018-11-20 NOTE — ED Notes (Signed)
ED TO INPATIENT HANDOFF REPORT  Name/Age/Gender Darrell Sutton 58 y.o. male  Code Status    Code Status Orders  (From admission, onward)         Start     Ordered   11/20/18 0026  Do not attempt resuscitation (DNR)  Continuous    Question Answer Comment  In the event of cardiac or respiratory ARREST Do not call a "code blue"   In the event of cardiac or respiratory ARREST Do not perform Intubation, CPR, defibrillation or ACLS   In the event of cardiac or respiratory ARREST Use medication by any route, position, wound care, and other measures to relive pain and suffering. May use oxygen, suction and manual treatment of airway obstruction as needed for comfort.      11/20/18 0026        Code Status History    Date Active Date Inactive Code Status Order ID Comments User Context   11/07/2018 1752 11/11/2018 1722 DNR [id number]  Aline August, MD ED   10/18/2018 0718 10/19/2018 1921 Full Code [id number]  Michael Boston, MD Inpatient   10/17/2018 1724 10/18/2018 0717 Full Code [id number]  Michael Boston, MD Inpatient      Home/SNF/Other Home  Chief Complaint Oncology Patient/Herperglycemic  Level of Care/Admitting Diagnosis ED Disposition    ED Disposition Condition Comment   [place]: [facility] [[id number]]  Level of Care: Stepdown [14]  Admit to SDU based on following criteria: Severe physiological/psychological symptoms:  Any diagnosis requiring assessment & intervention at least every 4 hours on an ongoing basis to obtain desired patient outcomes including stability and rehabilitation  Diagnosis: Hyperglycemic hyperosmolar nonketotic coma [facility]) [[id number]]  Admitting Physician: Colbert Ewing [[id number]]  Attending Physician: Colbert Ewing [[id number]]  PT Class (Do Not Modify): Observation [104]  PT Acc Code (Do Not Modify): Observation [10022]       Medical History Past Medical History:  Diagnosis Date  . Anxiety 2009   started with divorce   . Cancer [facility])    cancer of lower third of esophagus  . Diabetes mellitus without complication ([place])   . Gastric ulcer    from NSAID overuse  . GERD (gastroesophageal reflux disease)   . Hyperlipidemia   . Hypertension   . MI (myocardial infarction) ([place]) 2011   has two stents  . Obesity    highest weight in 500's    Allergies Allergies  Allergen Reactions  . Penicillins Hives    Has patient had a PCN reaction causing immediate rash, facial/tongue/throat swelling, SOB or lightheadedness with hypotension: Yes Has patient had a PCN reaction causing severe rash involving mucus membranes or skin necrosis: No Has patient had a PCN reaction that required hospitalization: No; was already in hosp Has patient had a PCN reaction occurring within the last 10 years: No If all of the above answers are "NO", then may proceed with Cephalosporin use.     IV Location/Drains/Wounds Patient Lines/Drains/Airways Status   Active Line/Drains/Airways    Name:   Placement date:   Placement time:   Site:   Days:   Implanted Port 10/17/18 Right Chest   10/17/18    1551    Chest   34   Biliary Tube Cook slip-coat 10 Fr. RUQ   11/09/18    1635    RUQ   11   Gastrostomy/Enterostomy Jejunostomy 18 Fr. LUQ   10/17/18    1429    LUQ   34  Incision (Closed) 10/17/18 Neck Right;Upper   10/17/18    1541     34   Incision (Closed) 10/17/18 Neck Right;Lower   10/17/18    1543     34   Incision - 3 Ports Abdomen Right;Upper Right;Mid Mid;Lower   10/17/18    1410     34          Labs/Imaging Results for orders placed or performed during the hospital encounter of 11/19/18 (from the past 48 hour(s))  CBG monitoring, ED     Status: Abnormal   Collection Time: 11/19/18 10:00 PM  Result Value Ref Range   Glucose-Capillary >600 (HH) 70 - 99 mg/dL  CBC with Differential/Platelet     Status: Abnormal   Collection Time: 11/19/18 11:16 PM  Result Value Ref Range   WBC 14.5 (H) 4.0 - 10.5 K/uL   RBC 6.65 (H)  4.22 - 5.81 MIL/uL   Hemoglobin 16.1 13.0 - 17.0 g/dL   HCT 54.0 (H) 39.0 - 52.0 %   MCV 81.2 80.0 - 100.0 fL   MCH 24.2 (L) 26.0 - 34.0 pg   MCHC 29.8 (L) 30.0 - 36.0 g/dL   RDW 20.1 (H) 11.5 - 15.5 %   Platelets 299 150 - 400 K/uL   nRBC 0.0 0.0 - 0.2 %   Neutrophils Relative % 90 %   Neutro Abs 13.0 (H) 1.7 - 7.7 K/uL   Lymphocytes Relative 1 %   Lymphs Abs 0.1 (L) 0.7 - 4.0 K/uL   Monocytes Relative 8 %   Monocytes Absolute 1.2 (H) 0.1 - 1.0 K/uL   Eosinophils Relative 0 %   Eosinophils Absolute 0.0 0.0 - 0.5 K/uL   Basophils Relative 0 %   Basophils Absolute 0.0 0.0 - 0.1 K/uL   Immature Granulocytes 1 %   Abs Immature Granulocytes 0.13 (H) 0.00 - 0.07 K/uL    Comment: Performed at [facility], [place] [address] [place], [place] [address]  Comprehensive metabolic panel     Status: Abnormal   Collection Time: 11/19/18 11:16 PM  Result Value Ref Range   Sodium 130 (L) 135 - 145 mmol/L   Potassium 5.2 (H) 3.5 - 5.1 mmol/L   Chloride 92 (L) 98 - 111 mmol/L   CO2 26 22 - 32 mmol/L   Glucose, Bld 766 (HH) 70 - 99 mg/dL    Comment: CRITICAL RESULT CALLED TO, READ BACK BY AND VERIFIED WITH: GARCIA,K AT 2345 ON 11/19/2018 BY MOSLEY,J    BUN 67 (H) 6 - 20 mg/dL   Creatinine, Ser 1.19 0.61 - 1.24 mg/dL   Calcium 9.9 8.9 - 10.3 mg/dL   Total Protein 8.5 (H) 6.5 - 8.1 g/dL   Albumin 3.3 (L) 3.5 - 5.0 g/dL   AST 22 15 - 41 U/L   ALT 48 (H) 0 - 44 U/L   Alkaline Phosphatase 174 (H) 38 - 126 U/L   Total Bilirubin 1.7 (H) 0.3 - 1.2 mg/dL   GFR calc non Af Amer >60 >60 mL/min   GFR calc Af Amer >60 >60 mL/min   Anion gap 12 5 - 15    Comment: Performed at [facility], [place] [address], [place], [place] [address]  Lipase, blood     Status: None   Collection Time: 11/19/18 11:16 PM  Result Value Ref Range   Lipase 51 11 - 51 U/L    Comment: Performed at [facility], [place] [address] [place], [place] [address]  Dg Chest 2  View  Result Date: 11/19/2018 CLINICAL DATA:  Stage IV esophageal cancer with spread to the lymph nodes. Elevated blood gas readings. EXAM: CHEST - 2 VIEW COMPARISON:  None. FINDINGS: The heart size and mediastinal contours are within normal limits. Both lungs are clear. Port catheter tip terminates in distal SVC-RA juncture. The visualized skeletal structures are unremarkable. IMPRESSION: No active cardiopulmonary disease. Electronically Signed   By: Ashley Royalty M.D.   On: 11/19/2018 22:38   None  Pending Labs Unresulted Labs (From admission, onward)    Start     Ordered   11/21/18 0500  Magnesium  Daily,   R     11/20/18 0036   11/20/18 0500  CBC  Daily,   R     11/20/18 0036   11/20/18 2979  Basic metabolic panel  Now then every 4 hours,   R     11/20/18 0040          Vitals/Pain Today's Vitals   11/19/18 2326 11/19/18 2330 11/19/18 2345 11/20/18 0000  BP:    (!) 170/117  Pulse:  (!) 104 (!) 102 (!) 107  Resp:      Temp:      TempSrc:      SpO2:  (!) 89% 91% 95%  Weight:      Height:      PainSc: Asleep       Isolation Precautions No active isolations  Medications Medications  dextrose 5 %-0.45 % sodium chloride infusion ( Intravenous Hold 11/20/18 0003)  insulin regular, human (MYXREDLIN) 100 units/ 100 mL infusion (has no administration in time range)  sodium chloride 0.9 % bolus 1,000 mL (1,000 mLs Intravenous New Bag/Given 11/20/18 0035)    And  sodium chloride 0.9 % bolus 1,000 mL (has no administration in time range)    And  0.9 %  sodium chloride infusion (has no administration in time range)  cloNIDine (CATAPRES - Dosed in mg/24 hr) patch 0.1 mg (has no administration in time range)  scopolamine (TRANSDERM-SCOP) 1 MG/3DAYS 1.5 mg (has no administration in time range)  ondansetron (ZOFRAN) 4 MG/5ML solution 4 mg (has no administration in time range)  enoxaparin (LOVENOX) injection 60 mg (has no administration in time range)  HYDROmorphone (DILAUDID) injection 1  mg (has no administration in time range)  oxyCODONE (ROXICODONE) 5 MG/5ML solution 10 mg (has no administration in time range)  sodium chloride 0.9 % bolus 1,000 mL (0 mLs Intravenous Stopped 11/20/18 0016)  HYDROmorphone (DILAUDID) injection 1 mg (1 mg Intravenous Given 11/19/18 2315)    Mobility non-ambulatory

## 2018-11-20 NOTE — Progress Notes (Signed)
CBG was 463. Provider notified. Awaiting orders. Will continue to monitor.  Mariann Laster, RN

## 2018-11-20 NOTE — Progress Notes (Signed)
Lovenox per Pharmacy for DVT Prophylaxis    Pharmacy has been consulted from dosing enoxaparin (lovenox) in this patient for DVT prophylaxis.  The pharmacist has reviewed pertinent labs (Hgb _16.1__; PLT_299__), patient weight (_120__kg) and renal function (CrCl__>90_mL/min) and decided that enoxaparin _60_mg SQ Q24Hrs is appropriate for this patient.  The pharmacy department will sign off at this time.  Please reconsult pharmacy if status changes or for further issues.  Thank you  Cyndia Diver PharmD, BCPS  11/20/2018, 12:33 AM

## 2018-11-21 DIAGNOSIS — E860 Dehydration: Secondary | ICD-10-CM

## 2018-11-21 LAB — GLUCOSE, CAPILLARY
GLUCOSE-CAPILLARY: 342 mg/dL — AB (ref 70–99)
Glucose-Capillary: 260 mg/dL — ABNORMAL HIGH (ref 70–99)
Glucose-Capillary: 261 mg/dL — ABNORMAL HIGH (ref 70–99)
Glucose-Capillary: 273 mg/dL — ABNORMAL HIGH (ref 70–99)
Glucose-Capillary: 357 mg/dL — ABNORMAL HIGH (ref 70–99)
Glucose-Capillary: 461 mg/dL — ABNORMAL HIGH (ref 70–99)

## 2018-11-21 LAB — BASIC METABOLIC PANEL
ANION GAP: 10 (ref 5–15)
BUN: 75 mg/dL — ABNORMAL HIGH (ref 6–20)
CO2: 28 mmol/L (ref 22–32)
Calcium: 9.4 mg/dL (ref 8.9–10.3)
Chloride: 101 mmol/L (ref 98–111)
Creatinine, Ser: 1.13 mg/dL (ref 0.61–1.24)
GFR calc Af Amer: >60 mL/min (ref >60)
Glucose, Bld: 356 mg/dL — ABNORMAL HIGH (ref 70–99)
Potassium: 4.3 mmol/L (ref 3.5–5.1)
Sodium: 139 mmol/L (ref 135–145)

## 2018-11-21 MED ORDER — CHLORHEXIDINE GLUCONATE 0.12 % MT SOLN
15.0000 mL | Freq: Two times a day (BID) | OROMUCOSAL | Status: DC
  Administered 2018-11-21 – 2018-11-22 (×2): 15 mL via OROMUCOSAL
  Filled 2018-11-21 (×3): qty 15

## 2018-11-21 MED ORDER — INSULIN ASPART 100 UNIT/ML ~~LOC~~ SOLN
10.0000 [IU] | Freq: Once | SUBCUTANEOUS | Status: AC
  Administered 2018-11-21: 10 [IU] via SUBCUTANEOUS

## 2018-11-21 MED ORDER — INSULIN NPH (HUMAN) (ISOPHANE) 100 UNIT/ML ~~LOC~~ SUSP
12.0000 [IU] | Freq: Two times a day (BID) | SUBCUTANEOUS | Status: DC
  Administered 2018-11-21: 12 [IU] via SUBCUTANEOUS

## 2018-11-21 MED ORDER — ORAL CARE MOUTH RINSE
15.0000 mL | Freq: Two times a day (BID) | OROMUCOSAL | Status: DC
  Administered 2018-11-21 – 2018-11-22 (×2): 15 mL via OROMUCOSAL

## 2018-11-21 MED ORDER — INSULIN NPH (HUMAN) (ISOPHANE) 100 UNIT/ML ~~LOC~~ SUSP
15.0000 [IU] | Freq: Two times a day (BID) | SUBCUTANEOUS | Status: DC
  Administered 2018-11-21 – 2018-11-22 (×2): 15 [IU] via SUBCUTANEOUS
  Filled 2018-11-21: qty 10

## 2018-11-21 MED ORDER — INSULIN NPH (HUMAN) (ISOPHANE) 100 UNIT/ML ~~LOC~~ SUSP: 10.0000 [IU] | Freq: Two times a day (BID) | SUBCUTANEOUS | Status: DC

## 2018-11-21 NOTE — Progress Notes (Signed)
Provider paged for third time. Still awaiting orders. Will continue to monitor.  Mariann Laster, RN

## 2018-11-21 NOTE — Progress Notes (Signed)
Re-ckecked CBG and received a reading of 461. Provider paged again.  Awaiting orders.  Mariann Laster, RN

## 2018-11-21 NOTE — Progress Notes (Signed)
PROGRESS NOTE    Darrell Sutton  BOF:[id number] DOB: 06/18/1961 DOA: 11/19/2018 PCP: [name], PA    Brief Narrative:  58 y.o. 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 Patient reports that over the past few days, he has been noting elevated blood sugars higher than 300s and states that he subjectively feels dehydrated. Having abdominal pain (R sided and across upper abdomen) since biliary drain placement, which has remained stable and not worsened over the past few days. No fevers or chills. Non productive intermittent cough, unchanged for month or so.   Assessment & Plan:   Active Problems:   Cancer of lower third of esophagus (HCC)   Hyperglycemic hyperosmolar nonketotic coma ([place])   Hyperglycemia   # Hyperglycemia and concern for HHS - likely combination of dehydration and under-dosed insulin after initiating tube feeding -transitioned off insulin gtt this AM with 15 units lantus -Discussed with Diabetic Coordinator with initial recommendation to continue on BID NPH 8 units starting 1/7 AM with continued SSI coverage -glucose remains uncontrolled with lowest glucose of 260 this AM. Discussed with diabetic coordinator. -Increase NPH to 15 units BID with continued SSI. Given uncontrolled glucose, will continue to monitor overnight incase additional adjustments are needed  # AKI initial Cr 1.19 - likely pre-renal  - baseline Cr less than 1 - Given IVF overnight. Improved  # IDDM - Insulin plan per above  # Metastatic esophageal CA s/p feeding tube and recent biliary drain  - continue dilaudid IV 1mg  q3h prn pain - liquid oxycodone via feeding tube q4 prn pain - zofran liquid via feeding tube prn nausea - resumed home ativan 0.5mg  oral (crush under tongue) q6h prn  anxiety/nausea  # HTN at home on clonidine patch and metoprolol and HCTZ -BP in 160-170s on admission -continue clonidine patch -diuretics on hold per above arf - holding metoprolol for now (at home via J tube). Per med rec, pt has not been taking   DVT prophylaxis: Lovenox subQ Code Status: DNR Family Communication: Pt in room, family not at bedside Disposition Plan: Possible d/c home if glucose improves  Consultants:     Procedures:     Antimicrobials: Anti-infectives (From admission, onward)   None      Subjective: Without complaints at this time  Objective: Vitals:   11/21/18 0900 11/21/18 1143 11/21/18 1212 11/21/18 1407  BP: (!) 137/91   (!) 150/99  Pulse: (!) 102 (!) 113  (!) 110  Resp: 13 18  16   Temp:   97.6 F (36.4 C)   TempSrc:   Axillary   SpO2: 94% 92%  94%  Weight:      Height:        Intake/Output Summary (Last 24 hours) at 11/21/2018 1522 Last data filed at 11/21/2018 1500 Gross per 24 hour  Intake 2000 ml  Output 1175 ml  Net 825 ml   Filed Weights   11/19/18 2154 11/21/18 0422  Weight: 120.2 kg 113.7 kg    Examination: General exam: Awake, laying in bed, in nad Respiratory system: Normal respiratory effort, no wheezing Cardiovascular system: regular rate, s1, s2 Gastrointestinal system: Soft, nondistended, positive BS, drain in place Central nervous system: CN2-12 grossly intact, strength intact Extremities: Perfused, no clubbing Skin: Normal skin turgor, no notable skin lesions seen Psychiatry: Mood normal // no visual hallucinations   Data  Reviewed: I have personally reviewed following labs and imaging studies  CBC: Recent Labs  Lab 11/19/18 2316 11/20/18 0400  WBC 14.5* 11.6*  NEUTROABS 13.0*  --   HGB 16.1 16.2  HCT 54.0* 54.5*  MCV 81.2 80.4  PLT 299 500   Basic Metabolic Panel: Recent Labs  Lab 11/20/18 0617 11/20/18 0800 11/20/18 1135 11/20/18 1520 11/21/18 0345  NA 142 140 143 139 139  K 4.8 4.9 5.5*  5.6* 4.3  CL 101 99 101 102 101  CO2 28 30 30 26 28   GLUCOSE 155* 202* 187* 366* 356*  BUN 73* 71* 72* 76* 75*  CREATININE 1.14 1.07 1.11 1.16 1.13  CALCIUM 10.1 10.0 10.0 9.2 9.4   GFR: Estimated Creatinine Clearance: 93.9 mL/min (by C-G formula based on SCr of 1.13 mg/dL). Liver Function Tests: Recent Labs  Lab 11/19/18 2316  AST 22  ALT 48*  ALKPHOS 174*  BILITOT 1.7*  PROT 8.5*  ALBUMIN 3.3*   Recent Labs  Lab 11/19/18 2316  LIPASE 51   No results for input(s): AMMONIA in the last 168 hours. Coagulation Profile: No results for input(s): INR, PROTIME in the last 168 hours. Cardiac Enzymes: No results for input(s): CKTOTAL, CKMB, CKMBINDEX, TROPONINI in the last 168 hours. BNP (last 3 results) No results for input(s): PROBNP in the last 8760 hours. HbA1C: No results for input(s): HGBA1C in the last 72 hours. CBG: Recent Labs  Lab 11/20/18 2346 11/21/18 0046 11/21/18 0349 11/21/18 0825 11/21/18 1202  GLUCAP 463* 461* 342* 260* 273*   Lipid Profile: No results for input(s): CHOL, HDL, LDLCALC, TRIG, CHOLHDL, LDLDIRECT in the last 72 hours. Thyroid Function Tests: No results for input(s): TSH, T4TOTAL, FREET4, T3FREE, THYROIDAB in the last 72 hours. Anemia Panel: No results for input(s): VITAMINB12, FOLATE, FERRITIN, TIBC, IRON, RETICCTPCT in the last 72 hours. Sepsis Labs: No results for input(s): PROCALCITON, LATICACIDVEN in the last 168 hours.  Recent Results (from the past 240 hour(s))  MRSA PCR Screening     Status: None   Collection Time: 11/20/18  1:41 AM  Result Value Ref Range Status   MRSA by PCR NEGATIVE NEGATIVE Final    Comment:        The GeneXpert MRSA Assay (FDA approved for NASAL specimens only), is one component of a comprehensive MRSA colonization surveillance program. It is not intended to diagnose MRSA infection nor to guide or monitor treatment for MRSA infections. Performed at [facility], [place] [address], [place], [place] [address]      Radiology Studies: Dg Chest 2 View  Result Date: 11/19/2018 CLINICAL DATA:  Stage IV esophageal cancer with spread to the lymph nodes. Elevated blood gas readings. EXAM: CHEST - 2 VIEW COMPARISON:  None. FINDINGS: The heart size and mediastinal contours are within normal limits. Both lungs are clear. Port catheter tip terminates in distal SVC-RA juncture. The visualized skeletal structures are unremarkable. IMPRESSION: No active cardiopulmonary disease. Electronically Signed   By: Ashley Royalty M.D.   On: 11/19/2018 22:38    Scheduled Meds: . chlorhexidine  15 mL Mouth Rinse BID  . cloNIDine  0.1 mg Transdermal Weekly  . enoxaparin (LOVENOX) injection  60 mg Subcutaneous Q24H  . free water  100 mL Per Tube Q4H  . insulin aspart  0-15 Units Subcutaneous Q4H  . insulin NPH Human  15 Units Subcutaneous BID AC & HS  . mouth rinse  15 mL Mouth Rinse q12n4p  . scopolamine  1 patch Transdermal  Q72H   Continuous Infusions: . sodium chloride     And  . sodium chloride    . feeding supplement (VITAL AF 1.2 CAL) 1,000 mL (11/21/18 0111)     LOS: 0 days   Marylu Lund, MD [facility] Pager On Amion  If 7PM-7AM, please contact night-coverage 11/21/2018, 3:22 PM

## 2018-11-22 ENCOUNTER — Telehealth: Payer: Self-pay | Admitting: Hematology

## 2018-11-22 ENCOUNTER — Other Ambulatory Visit: Payer: Self-pay | Admitting: Radiology

## 2018-11-22 DIAGNOSIS — E860 Dehydration: Secondary | ICD-10-CM

## 2018-11-22 DIAGNOSIS — K219 Gastro-esophageal reflux disease without esophagitis: Secondary | ICD-10-CM

## 2018-11-22 DIAGNOSIS — E875 Hyperkalemia: Secondary | ICD-10-CM

## 2018-11-22 DIAGNOSIS — E118 Type 2 diabetes mellitus with unspecified complications: Secondary | ICD-10-CM

## 2018-11-22 DIAGNOSIS — I2510 Atherosclerotic heart disease of native coronary artery without angina pectoris: Secondary | ICD-10-CM

## 2018-11-22 DIAGNOSIS — I10 Essential (primary) hypertension: Secondary | ICD-10-CM

## 2018-11-22 LAB — GLUCOSE, CAPILLARY
Glucose-Capillary: 222 mg/dL — ABNORMAL HIGH (ref 70–99)
Glucose-Capillary: 231 mg/dL — ABNORMAL HIGH (ref 70–99)
Glucose-Capillary: 329 mg/dL — ABNORMAL HIGH (ref 70–99)
Glucose-Capillary: 382 mg/dL — ABNORMAL HIGH (ref 70–99)

## 2018-11-22 LAB — BASIC METABOLIC PANEL
Anion gap: 11 (ref 5–15)
BUN: 98 mg/dL — ABNORMAL HIGH (ref 6–20)
CO2: 23 mmol/L (ref 22–32)
Calcium: 8.2 mg/dL — ABNORMAL LOW (ref 8.9–10.3)
Chloride: 106 mmol/L (ref 98–111)
Creatinine, Ser: 1.22 mg/dL (ref 0.61–1.24)
GFR calc Af Amer: >60 mL/min (ref >60)
GFR calc non Af Amer: >60 mL/min (ref >60)
Glucose, Bld: 231 mg/dL — ABNORMAL HIGH (ref 70–99)
Potassium: 3.8 mmol/L (ref 3.5–5.1)
Sodium: 140 mmol/L (ref 135–145)

## 2018-11-22 MED ORDER — INSULIN NPH (HUMAN) (ISOPHANE) 100 UNIT/ML ~~LOC~~ SUSP: 18.0000 [IU] | Freq: Two times a day (BID) | SUBCUTANEOUS | Status: DC

## 2018-11-22 MED ORDER — FREE WATER: 100.0000 mL | Status: AC

## 2018-11-22 MED ORDER — HYDROCODONE-ACETAMINOPHEN 7.5-325 MG/15ML PO SOLN: 15.0000 mL | Freq: Four times a day (QID) | ORAL | 0 refills | Status: AC | PRN

## 2018-11-22 MED ORDER — INSULIN NPH (HUMAN) (ISOPHANE) 100 UNIT/ML ~~LOC~~ SUSP: 18.0000 [IU] | Freq: Two times a day (BID) | SUBCUTANEOUS | 1 refills | Status: DC

## 2018-11-22 MED ORDER — INSULIN NPH (HUMAN) (ISOPHANE) 100 UNIT/ML ~~LOC~~ SUSP
3.0000 [IU] | Freq: Once | SUBCUTANEOUS | Status: AC
  Administered 2018-11-22: 3 [IU] via SUBCUTANEOUS
  Filled 2018-11-22: qty 10

## 2018-11-22 MED ORDER — INSULIN ASPART 100 UNIT/ML ~~LOC~~ SOLN
0.0000 [IU] | Freq: Three times a day (TID) | SUBCUTANEOUS | Status: DC
  Administered 2018-11-22: 7 [IU] via SUBCUTANEOUS

## 2018-11-22 MED ORDER — HEPARIN SOD (PORK) LOCK FLUSH 100 UNIT/ML IV SOLN
500.0000 [IU] | INTRAVENOUS | Status: AC | PRN
  Administered 2018-11-22: 500 [IU]

## 2018-11-22 MED ORDER — SODIUM CHLORIDE 0.9% FLUSH: 10.0000 mL | INTRAVENOUS | Status: DC | PRN

## 2018-11-22 MED ORDER — METOPROLOL TARTRATE 25 MG PO TABS: 12.5000 mg | ORAL_TABLET | Freq: Two times a day (BID) | ORAL | 0 refills | Status: AC

## 2018-11-22 MED ORDER — INSULIN ASPART PROT & ASPART (70-30 MIX) 100 UNIT/ML ~~LOC~~ SUSP: 18.0000 [IU] | Freq: Two times a day (BID) | SUBCUTANEOUS | 0 refills | Status: DC

## 2018-11-22 MED ORDER — INSULIN REGULAR HUMAN 100 UNIT/ML IJ SOLN: INTRAMUSCULAR | 0 refills | Status: DC

## 2018-11-22 MED ORDER — INSULIN ASPART 100 UNIT/ML ~~LOC~~ SOLN: 0.0000 [IU] | Freq: Four times a day (QID) | SUBCUTANEOUS | 0 refills | Status: DC

## 2018-11-22 MED ORDER — SODIUM CHLORIDE 0.9 % IV BOLUS
500.0000 mL | Freq: Once | INTRAVENOUS | Status: AC
  Administered 2018-11-22: 500 mL via INTRAVENOUS

## 2018-11-22 NOTE — Progress Notes (Signed)
Inpatient Diabetes Program Recommendations  AACE/ADA: New Consensus Statement on Inpatient Glycemic Control (2015)  Target Ranges:  Prepandial:   less than 140 mg/dL      Peak postprandial:   less than 180 mg/dL (1-2 hours)      Critically ill patients:  140 - 180 mg/dL   Lab Results  Component Value Date   GLUCAP 222 (H) 11/22/2018   HGBA1C 8.4 (H) 10/17/2018    Review of Glycemic Control  Ready for discharge today. Instructed pt regarding going home on Novolin N (ReliOn) bid (8am and HS) and ReliOn Regular insulin 0-20 units Q6H (to cover TFs and correction). To be d/ced on Vital AF 1.2 at 80/hour.  Inpatient Diabetes Program Recommendations:     Novolin N 18 units bid Regular insulin 0-20 units Q6H.  Have discussed importance of checking blood sugars at least QID and taking logbook to PCP for any needed adjustments to insulin regimen.  Also discussed hypoglycemia s/s and treatment.   Thank you. Lorenda Peck, RD, LDN, CDE Inpatient Diabetes Coordinator [phone]

## 2018-11-22 NOTE — Care Management Note (Signed)
Case Management Note  Patient Details  Name: JRU PENSE MRN: [id number] Date of Birth: 23-Oct-1961  Subjective/Objective:                  DISCHARGE  Action/Plan: Home ith advanced hhc for resumption of hhc and tube feedings  Expected Discharge Date:  11/22/18               Expected Discharge Plan:     In-House Referral:     Discharge planning Services     Post Acute Care Choice:  Durable Medical Equipment Choice offered to:  Spouse  DME Arranged:  Tube feeding DME Agency:     HH Arranged:  RN, Disease Management [place] Agency:  [facility]  Status of Service:     If discussed at [place] of Stay Meetings, dates discussed:    Additional Comments:  Leeroy Cha, RN 11/22/2018, 3:01 PM

## 2018-11-22 NOTE — Progress Notes (Signed)
Discharge instructions and medications discussed with patient and wife.  AVS and prescriptions given to wife.  All questions answered.

## 2018-11-22 NOTE — Telephone Encounter (Signed)
R/s appt per 1/8 sch message - pt wife is aware of appt date and time

## 2018-11-22 NOTE — Progress Notes (Signed)
Blood sugar levels increased overnight from daytime levels. 357 @ 818pm and 382 @ 1218am. Gave 15 untis of novolog each time plus Humulin 15 units overnight. Tube feeding tubing was changed at 0130.

## 2018-11-22 NOTE — Discharge Summary (Signed)
Physician Discharge Summary  Darrell Sutton EXB:[id number] DOB: 05-09-1961 DOA: 11/19/2018  PCP: [name], PA  Admit date: 11/19/2018 Discharge date: 11/22/2018  Time spent: 60 minutes  Recommendations for Outpatient Follow-up:  1. Follow-up with [name], oncology next week as scheduled. 2. Follow-up with [name], PA in 1 to 2 weeks.  On follow-up patient will need a basic metabolic profile done to follow-up on electrolytes and renal function.  Patient's diabetes will need to be reassessed and further recommendations of patient's insulin made at that time. 3. Follow-up with Dr. Percival Spanish, cardiology as scheduled.   Discharge Diagnoses:  Principal Problem:   Hyperglycemic hyperosmolar nonketotic coma ([place]) Active Problems:   Hypertension   Hyperlipidemia   Anxiety   Gastroesophageal reflux disease   Coronary artery disease involving native coronary artery of native heart without angina pectoris   Type 2 diabetes mellitus with complication, without long-term current use of insulin ([place])   Cancer of lower third of esophagus ([place])   Hyperglycemia   Discharge Condition: Stable and improved  Diet recommendation: Heart healthy/carb modified  Filed Weights   11/19/18 2154 11/21/18 0422 11/22/18 0706  Weight: 120.2 kg 113.7 kg 116.5 kg    History of present illness:  Per Dr. Gentry Roch Darrell Sutton is a 58 y.o. 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 Patient reported that over the past few days, he has been noting elevated blood sugars higher than 300s and stated that he subjectively feels dehydrated. Had abdominal pain (R sided and across upper abdomen) since biliary drain placement, which has remained stable and not worsened over the past few days. No fevers or chills. Non productive  intermittent cough, unchanged for month or so.    Review of Systems:  - no fevers/chills - unchanged dry cough - no chest pain, dyspnea on exertion - no edema, PND, orthopnea - no nausea/vomiting; no tarry, melanotic or bloody stools - no dysuria, increased urinary frequency - no weight changes Rest of systems reviewed are negative, except as per above history.   ED course:  Vitals Blood pressure (!) 143/99, pulse (!) 116, temperature (!) 96 F (35.6 C), temperature source Oral, resp. rate 16, height 6' (1.829 m), weight 120.2 kg, SpO2 95 %. Received insulin drip / glucostabilizer and NS bolus   Hospital Course:  #Hyperglycemia and concern for HHS - likely combination of dehydration and under-dosed insulin after initiating tube feeding -Patient on admission was noted to have blood glucose levels significantly elevated of greater than 600 on admission.  Comprehensive metabolic profile obtained on admission noted to have a blood glucose level of 766.  Bicarb was 26.  Patient was placed on insulin drip and placed in the stepdown unit.  Patient placed on IV fluids and monitored.  Patient improved clinically and was subsequently transitioned off the insulin drip to 15 units of Lantus daily.  -Discussed with Diabetic Coordinator with initial recommendation to continue on BID NPH 8 units starting 1/7 AM with continued SSI coverage -glucose remained uncontrolled with lowest glucose of 260 this AM of 11/21/2018.  Patient was placed on NPH and dose increased to 15 units twice daily as well as sliding scale insulin.  Blood glucose levels improved however on morning of discharge blood glucose was 222.  NPH dose has been increased to 18 units twice daily and patient placed on  a resistant scale insulin.  Outpatient follow-up.  #AKI initial Cr 1.19 - likely pre-renal -Baseline creatinine noted to be less than 1.  Patient hydrated with IV fluids with improvement with renal function.  Outpatient  follow-up with PCP.   #IDDM - Insulin plan per above  #Metastatic esophageal CA s/p feeding tube and recent biliary drain -Patient maintained on IV Dilaudid every 3 hours as needed for pain during the hospitalization as well as oxycodone via feeding tube every 4 hours as needed.  Patient was also maintained on Zofran as needed for nausea.  Patient's pain was managed.  Patient resumed on home regimen of Ativan 0.5 mg every 6 hours as needed anxiety and nausea.  Patient was seen by his oncologist, [name] who will follow up with patient in the outpatient setting in 1 week.  Patient will be discharged home on 1 weeks worth of Hycet 7.5/325 every 6 hours as needed.  Outpatient follow-up with oncology.   # HTN at home on clonidine patch and metoprolol and HCTZ -BP in 160-170s on admission -Patient maintained on clonidine patch.  Patient's diuretics of HCTZ were held.  Metoprolol was held.  Metoprolol per med rec patient had not been taking.  Patient with a history of coronary artery disease and as such we will rate resume metoprolol half home dose.  Outpatient follow-up with PCP.   Procedures:  Chest x-ray 11/19/2018    Consultations:  None  Discharge Exam: Vitals:   11/22/18 0934 11/22/18 1353  BP: 108/84 118/78  Pulse:  95  Resp:    Temp:    SpO2:  96%    General: NAD Cardiovascular: RRR Respiratory: CTAB  Discharge Instructions   Discharge Instructions    Increase activity slowly   Complete by:  As directed      Allergies as of 11/22/2018      Reactions   Penicillins Hives   Has patient had a PCN reaction causing immediate rash, facial/tongue/throat swelling, SOB or lightheadedness with hypotension: Yes Has patient had a PCN reaction causing severe rash involving mucus membranes or skin necrosis: No Has patient had a PCN reaction that required hospitalization: No; was already in hosp Has patient had a PCN reaction occurring within the last 10 years: No If all of  the above answers are "NO", then may proceed with Cephalosporin use.      Medication List    STOP taking these medications   hydrochlorothiazide 25 MG tablet Commonly known as:  HYDRODIURIL   insulin aspart protamine- aspart (70-30) 100 UNIT/ML injection Commonly known as:  NOVOLOG MIX 70/30     TAKE these medications   bisacodyl 10 MG suppository Commonly known as:  DULCOLAX Place 1 suppository (10 mg total) rectally as needed for moderate constipation.   cloNIDine 0.1 mg/24hr patch Commonly known as:  CATAPRES - Dosed in mg/24 hr Place 1 patch (0.1 mg total) onto the skin once a week.   feeding supplement (GLUCERNA 1.5 CAL) Liqd Place 1,000 mLs into feeding tube continuous. Start @ 64ml/hr, increase by 77ml every 8 hours to a goal of 31ml/hr   free water Soln Place 100 mLs into feeding tube every 4 (four) hours.   HYDROcodone-acetaminophen 7.5-325 mg/15 ml solution Commonly known as:  HYCET Place 15 mLs into feeding tube every 6 (six) hours as needed for up to 7 days for moderate pain. What changed:  how to take this   insulin NPH Human 100 UNIT/ML injection Commonly known as:  HUMULIN N,NOVOLIN N  Inject 0.18 mLs (18 Units total) into the skin 2 (two) times daily at 8 am and 10 pm.   insulin regular 100 units/mL injection Commonly known as:  NOVOLIN R,HUMULIN R For glucose 121 to 150 use 3 units, for 151 to 200 use 4 units, for 201-250 use 7 units, for 251-300 use 11 units, for 301 to 350 use 15 units. FOR 351-400 use 20 units What changed:  additional instructions   LORazepam 0.5 MG tablet Commonly known as:  ATIVAN Place 1 tablet (0.5 mg total) under the tongue every 6 (six) hours as needed for anxiety.   metoprolol tartrate 25 MG tablet Commonly known as:  LOPRESSOR Take 0.5 tablets (12.5 mg total) by mouth 2 (two) times daily. What changed:  See the new instructions.   ondansetron 4 MG/5ML solution Commonly known as:  ZOFRAN Place 5 mLs (4 mg total) into  feeding tube every 8 (eight) hours as needed for nausea or vomiting.   polyethylene glycol packet Commonly known as:  MIRALAX / GLYCOLAX 17 g by Per J Tube route daily as needed for mild constipation.   scopolamine 1 MG/3DAYS Commonly known as:  TRANSDERM-SCOP Place 1 patch (1.5 mg total) onto the skin every 3 (three) days.   Syringe (Disposable) 3 ML Misc Insulin syringes and needles.      Allergies  Allergen Reactions  . Penicillins Hives    Has patient had a PCN reaction causing immediate rash, facial/tongue/throat swelling, SOB or lightheadedness with hypotension: Yes Has patient had a PCN reaction causing severe rash involving mucus membranes or skin necrosis: No Has patient had a PCN reaction that required hospitalization: No; was already in hosp Has patient had a PCN reaction occurring within the last 10 years: No If all of the above answers are "NO", then may proceed with Cephalosporin use.    Follow-up Information    [name], [place]. Schedule an appointment as soon as possible for a visit in 2 week(s).   Specialty:  Physician Assistant Why:  f/u in 1-2 weeks. Contact information: [place] [address] [phone]        [name], MD .   Specialty:  Cardiology Contact information: [address] [place] [address] [phone]        Truitt Merle, MD Follow up.   Specialties:  Hematology, Oncology Why:  f/u as scheduled next week. Contact information: [place] [address] [phone]            The results of significant diagnostics from this hospitalization (including imaging, microbiology, ancillary and laboratory) are listed below for reference.    Significant Diagnostic Studies: Dg Chest 2 View  Result Date: 11/19/2018 CLINICAL DATA:  Stage IV esophageal cancer with spread to the lymph nodes. Elevated blood gas readings. EXAM: CHEST - 2 VIEW COMPARISON:  None. FINDINGS:  The heart size and mediastinal contours are within normal limits. Both lungs are clear. Port catheter tip terminates in distal SVC-RA juncture. The visualized skeletal structures are unremarkable. IMPRESSION: No active cardiopulmonary disease. Electronically Signed   By: Ashley Royalty M.D.   On: 11/19/2018 22:38   US Abdomen Complete  Result Date: 11/07/2018 CLINICAL DATA:  Jaundice, rule out biliary obstruction EXAM: ABDOMEN ULTRASOUND COMPLETE COMPARISON:  CT 08/22/2018 FINDINGS: Gallbladder: Gallbladder is distended to 6 cm in diameter. There is sludge within the gallbladder. No echogenic gallstones. Negative sonographic Murphy's sign. No pericholecystic fluid. Significant gallbladder wall thickening. Common bile duct: Diameter: Dilated  9 mm. Liver: No focal lesion identified. Within normal limits in parenchymal echogenicity. Portal vein is patent on color Doppler imaging with normal direction of blood flow towards the liver. IVC: No abnormality visualized. Pancreas: Visualized portion unremarkable. Spleen: Size and appearance within normal limits. Right Kidney: Length: 12.6 cm. Cystic lesion with thin septation is coming anechoic measures 2.3 cm. This corresponds to low-density cyst on comparison CT. Left Kidney: Length: 14.3 cm.  There is Abdominal aorta: No aneurysm visualized. Other findings: None. IMPRESSION: 1. Dilatation of the common bile duct and gallbladder without obstructing lesion identified. No gallstones are evident but there is sludge within the lumen gallbladder. Consider contrast MRCP to further evaluate etiology of potential distal biliary obstruction. 2. No intrahepatic biliary duct dilatation identified. Electronically Signed   By: Suzy Bouchard M.D.   On: 11/07/2018 14:52   Mr 3d Recon At Scanner  Result Date: 11/07/2018 CLINICAL DATA:  Jaundice. Elevated liver function tests. History of esophageal cancer. EXAM: MRI ABDOMEN WITHOUT AND WITH CONTRAST (INCLUDING MRCP) TECHNIQUE:  Multiplanar multisequence MR imaging of the abdomen was performed both before and after the administration of intravenous contrast. Heavily T2-weighted images of the biliary and pancreatic ducts were obtained, and three-dimensional MRCP images were rendered by post processing. CONTRAST:  10 cc Gadavist COMPARISON:  Multiple exams, including ultrasound 11/07/2018 and PET-CT from 09/29/2018 FINDINGS: Lower chest: Distal esophageal wall thickening with surrounding edema noted in this patient with history of esophageal mass. There is scarring or atelectasis in the right lower lobe. Hepatobiliary: Sludge in the gallbladder. Mild intrahepatic biliary dilatation with abrupt tapered/beaked appearance of the common bile duct approximately corresponding to its course along the pancreatic head. No well-defined filling defect. The common hepatic duct measures about 8 mm in diameter proximal to this area of narrowing. In the area of narrowing there is subtly accentuated enhancement in the expected vicinity of the dorsal pancreatic duct. The appearance is not classic for pancreatic malignancy, there is no associated hypoenhancement in this region. Pancreas: No discrete pancreatic mass is identified. As noted above, the narrowed segment of the CBD in the vicinity of the pancreatic head may potentially have faintly associated accentuated enhancement. Spleen:  Unremarkable Adrenals/Urinary Tract: Right mid kidney Bosniak category 2 cyst anteriorly. Otherwise unremarkable. Adrenal glands normal. Stomach/Bowel: There is wall thickening in the gastric cardia with surrounding stranding and indistinctness of tissue planes. Jejunostomy tube noted. Vascular/Lymphatic: A porta hepatis node measures 1.6 cm in short axis on image 48/1102. Another porta hepatis node measures 1.2 cm in short axis on image 45/1102. Smaller periaortic lymph nodes are present and there are small lymph nodes around the distal esophagus and gastric cardia. Other:   No supplemental non-categorized findings. Musculoskeletal: Unremarkable IMPRESSION: 1. Marked narrowing in a 2.3 cm segment of the common bile duct corresponding with the segment of the duct along the pancreas. I do not see a definite pancreatic lesion but there is some faintly accentuated enhancement in the CBD in this vicinity. This could be from localized inflammation/cholangitis or a stricture. The CBD proximal to this stricture is 0.8 cm in diameter. Sludge noted in the gallbladder without definite stones. 2. Distal esophageal wall thickening and thickening in the proximal stomach. There are also postoperative findings in the stomach. Surrounding inflammatory stranding is noted around the distal esophagus and proximal stomach, possibly therapy related. Surrounding small lymph nodes noted along with enlarged lymph nodes in the porta hepatis which are nonspecific and could be reactive or malignant. 3. Scarring or atelectasis  in the right lower lobe. Electronically Signed   By: Van Clines M.D.   On: 11/07/2018 19:06   Mr Abdomen Mrcp Moise Boring Contast  Result Date: 11/07/2018 CLINICAL DATA:  Jaundice. Elevated liver function tests. History of esophageal cancer. EXAM: MRI ABDOMEN WITHOUT AND WITH CONTRAST (INCLUDING MRCP) TECHNIQUE: Multiplanar multisequence MR imaging of the abdomen was performed both before and after the administration of intravenous contrast. Heavily T2-weighted images of the biliary and pancreatic ducts were obtained, and three-dimensional MRCP images were rendered by post processing. CONTRAST:  10 cc Gadavist COMPARISON:  Multiple exams, including ultrasound 11/07/2018 and PET-CT from 09/29/2018 FINDINGS: Lower chest: Distal esophageal wall thickening with surrounding edema noted in this patient with history of esophageal mass. There is scarring or atelectasis in the right lower lobe. Hepatobiliary: Sludge in the gallbladder. Mild intrahepatic biliary dilatation with abrupt  tapered/beaked appearance of the common bile duct approximately corresponding to its course along the pancreatic head. No well-defined filling defect. The common hepatic duct measures about 8 mm in diameter proximal to this area of narrowing. In the area of narrowing there is subtly accentuated enhancement in the expected vicinity of the dorsal pancreatic duct. The appearance is not classic for pancreatic malignancy, there is no associated hypoenhancement in this region. Pancreas: No discrete pancreatic mass is identified. As noted above, the narrowed segment of the CBD in the vicinity of the pancreatic head may potentially have faintly associated accentuated enhancement. Spleen:  Unremarkable Adrenals/Urinary Tract: Right mid kidney Bosniak category 2 cyst anteriorly. Otherwise unremarkable. Adrenal glands normal. Stomach/Bowel: There is wall thickening in the gastric cardia with surrounding stranding and indistinctness of tissue planes. Jejunostomy tube noted. Vascular/Lymphatic: A porta hepatis node measures 1.6 cm in short axis on image 48/1102. Another porta hepatis node measures 1.2 cm in short axis on image 45/1102. Smaller periaortic lymph nodes are present and there are small lymph nodes around the distal esophagus and gastric cardia. Other:  No supplemental non-categorized findings. Musculoskeletal: Unremarkable IMPRESSION: 1. Marked narrowing in a 2.3 cm segment of the common bile duct corresponding with the segment of the duct along the pancreas. I do not see a definite pancreatic lesion but there is some faintly accentuated enhancement in the CBD in this vicinity. This could be from localized inflammation/cholangitis or a stricture. The CBD proximal to this stricture is 0.8 cm in diameter. Sludge noted in the gallbladder without definite stones. 2. Distal esophageal wall thickening and thickening in the proximal stomach. There are also postoperative findings in the stomach. Surrounding inflammatory  stranding is noted around the distal esophagus and proximal stomach, possibly therapy related. Surrounding small lymph nodes noted along with enlarged lymph nodes in the porta hepatis which are nonspecific and could be reactive or malignant. 3. Scarring or atelectasis in the right lower lobe. Electronically Signed   By: Van Clines M.D.   On: 11/07/2018 19:06   Ir Int Lianne Cure Biliary Drain With Cholangiogram  Result Date: 11/09/2018 INDICATION: History of metastatic esophageal cancer, now with malignant biliary obstruction with associated jaundice and elevated bilirubin levels, presumably secondary to porta hepatis adenopathy. Patient with history of prior gastric bypass surgery and as such is not a candidate for attempted ERCP. Request made for placement of a percutaneous biliary drainage catheter for malignant biliary obstruction. EXAM: ULTRASOUND AND FLUOROSCOPIC GUIDED PERCUTANEOUS TRANSHEPATIC CHOLANGIOGRAM AND BILIARY TUBE PLACEMENT COMPARISON:  MRCP - 11/07/2018 MEDICATIONS: Vancomycin 1 gm IV; the antibiotic was administered within an appropriate time frame prior to the initiation of  the procedure. CONTRAST:  20 cc Isovue-300, administered into biliary tree ANESTHESIA/SEDATION: Moderate (conscious) sedation was employed during this procedure. A total of Versed 4 mg, Dilaudid 3 mg IV and Fentanyl 50 mcg was administered intravenously. Moderate Sedation Time: 36 minutes. The patient's level of consciousness and vital signs were monitored continuously by radiology nursing throughout the procedure under my direct supervision. FLUOROSCOPY TIME:  6 minutes (017 mGy) COMPLICATIONS: None immediate. TECHNIQUE: Informed written consent was obtained from the patient and the patient's wife after a discussion of the risks, benefits and alternatives to treatment. Questions regarding the procedure were encouraged and answered. A timeout was performed prior to the initiation of the procedure. The right upper  abdominal quadrant was prepped and draped in the usual sterile fashion, and a sterile drape was applied covering the operative field. Maximum barrier sterile technique with sterile gowns and gloves were used for the procedure. A timeout was performed prior to the initiation of the procedure. Ultrasound scanning was performed of the midline of the upper abdomen demonstrated very minimal left-sided intrahepatic biliary ductal dilatation. After lying soft tissues were anesthetized with 1% lidocaine with epinephrine, two attempts were made access the left biliary tree under direct ultrasound fluoroscopic guided however this ultimately proved unsuccessful secondary to lack of significant left-sided intrahepatic biliary duct dilatation. As such, a spot along the right mid axillary line was marked fluoroscopically inferior to the right costophrenic angle. A peripheral very mildly dilated duct within the anterior aspect of the right lobe of the liver was targeted under direct ultrasound guidance after the overlying soft tissues were anesthetized with 1% lidocaine with epinephrine. Contrast injection demonstrates intra biliary puncture. The access bile duct was cannulated with a Nitrex wire which was advanced to the central aspect of the biliary tree. Tract was dilated with the inner 3 French catheter of the Accustick set. Contrast injection confirmed appropriate peripheral access. Next, a Nitrex wire was advanced to the level of the duodenum. The track was dilated with the remainder of the [place] set. A 4 French angled glide catheter was advanced through the outer sheath the Accustick set to the level of the duodenum. Contrast injection confirmed appropriate positioning Next, over a Amplatz wire, the tract was dilated and a 10.2 [place] biliary drainage catheter was advanced with coil ultimately locked within the duodenum. Contrast was injected and a completion radiograph was obtained. The catheter was connected to a  drainage bag which yielded the brisk return of dark black colored bile. The catheter was secured to the skin with an interrupted suture. The patient tolerated the procedure well without immediate postprocedural complication. FINDINGS: Sonographic evaluation demonstrates very mild dilatation of the left intrahepatic biliary tree. Attempted though ultimately unsuccessful attempted left-sided biliary drainage catheter placement secondary lack of significant left-sided intrahepatic biliary ductal dilatation. Sonographic evaluation demonstrates mild dilatation of the right intrahepatic biliary tree which was ultimately successfully opacified and cannulated, allowing placement of a 10 French percutaneous biliary drainage catheter via the peripheral aspect of a very mildly dilated bile duct within the anterior segment of the right lobe of the liver with end coiled and locked within the duodenum. The radiopaque marker is located within the central aspect of the right anterior biliary tree. IMPRESSION: Successful placement of a 10.2 French percutaneous biliary drainage catheter with end coiled and locked within the duodenum. PLAN: - Biliary brush biopsy and formal cholangiogram could be performed following normalization of the patient's LFTs as clinically indicated. - Otherwise, fluoroscopic guided biliary stent placement could  be performed in 4-6 weeks as indicated. Electronically Signed   By: Sandi Mariscal M.D.   On: 11/09/2018 17:18    Microbiology: Recent Results (from the past 240 hour(s))  MRSA PCR Screening     Status: None   Collection Time: 11/20/18  1:41 AM  Result Value Ref Range Status   MRSA by PCR NEGATIVE NEGATIVE Final    Comment:        The GeneXpert MRSA Assay (FDA approved for NASAL specimens only), is one component of a comprehensive MRSA colonization surveillance program. It is not intended to diagnose MRSA infection nor to guide or monitor treatment for MRSA infections. Performed at  [facility], [place] [address] [place], [place] [address]      Labs: Basic Metabolic Panel: Recent Labs  Lab 11/20/18 0800 11/20/18 1135 11/20/18 1520 11/21/18 0345 11/22/18 0619  NA 140 143 139 139 140  K 4.9 5.5* 5.6* 4.3 3.8  CL 99 101 102 101 106  CO2 30 30 26 28 23   GLUCOSE 202* 187* 366* 356* 231*  BUN 71* 72* 76* 75* 98*  CREATININE 1.07 1.11 1.16 1.13 1.22  CALCIUM 10.0 10.0 9.2 9.4 8.2*   Liver Function Tests: Recent Labs  Lab 11/19/18 2316  AST 22  ALT 48*  ALKPHOS 174*  BILITOT 1.7*  PROT 8.5*  ALBUMIN 3.3*   Recent Labs  Lab 11/19/18 2316  LIPASE 51   No results for input(s): AMMONIA in the last 168 hours. CBC: Recent Labs  Lab 11/19/18 2316 11/20/18 0400  WBC 14.5* 11.6*  NEUTROABS 13.0*  --   HGB 16.1 16.2  HCT 54.0* 54.5*  MCV 81.2 80.4  PLT 299 232   Cardiac Enzymes: No results for input(s): CKTOTAL, CKMB, CKMBINDEX, TROPONINI in the last 168 hours. BNP: BNP (last 3 results) No results for input(s): BNP in the last 8760 hours.  ProBNP (last 3 results) No results for input(s): PROBNP in the last 8760 hours.  CBG: Recent Labs  Lab 11/21/18 2018 11/22/18 0018 11/22/18 0414 11/22/18 0734 11/22/18 1159  GLUCAP 357* 382* 329* 222* 231*       Signed:  Irine Seal MD.  [facility] 11/22/2018, 3:41 PM

## 2018-11-23 ENCOUNTER — Other Ambulatory Visit (HOSPITAL_COMMUNITY): Payer: Self-pay | Admitting: Interventional Radiology

## 2018-11-23 ENCOUNTER — Ambulatory Visit (HOSPITAL_COMMUNITY)
Admission: RE | Admit: 2018-11-23 | Discharge: 2018-11-23 | Disposition: A | Discharge status: Home / Self Care | Payer: Self-pay | Source: Ambulatory Visit | Attending: Interventional Radiology | Admitting: Interventional Radiology

## 2018-11-23 ENCOUNTER — Encounter (HOSPITAL_COMMUNITY): Payer: Self-pay

## 2018-11-23 ENCOUNTER — Inpatient Hospital Stay: Payer: Self-pay

## 2018-11-23 ENCOUNTER — Inpatient Hospital Stay: Payer: Self-pay | Admitting: Hematology

## 2018-11-23 ENCOUNTER — Other Ambulatory Visit: Payer: Self-pay

## 2018-11-23 DIAGNOSIS — K831 Obstruction of bile duct: Secondary | ICD-10-CM

## 2018-11-23 HISTORY — PX: IR EXCHANGE BILIARY DRAIN: IMG6046

## 2018-11-23 HISTORY — PX: IR ENDOLUMINAL BX OF BILIARY TREE: IMG6053

## 2018-11-23 LAB — CBC WITH DIFFERENTIAL/PLATELET
Abs Immature Granulocytes: 0.11 10*3/uL — ABNORMAL HIGH (ref 0.00–0.07)
Basophils Absolute: 0 10*3/uL (ref 0.0–0.1)
Basophils Relative: 0 %
Eosinophils Absolute: 0 10*3/uL (ref 0.0–0.5)
Eosinophils Relative: 0 %
HCT: 53.8 % — ABNORMAL HIGH (ref 39.0–52.0)
Hemoglobin: 16.3 g/dL (ref 13.0–17.0)
Immature Granulocytes: 1 %
Lymphocytes Relative: 2 %
Lymphs Abs: 0.2 10*3/uL — ABNORMAL LOW (ref 0.7–4.0)
MCH: 24.9 pg — ABNORMAL LOW (ref 26.0–34.0)
MCHC: 30.3 g/dL (ref 30.0–36.0)
MCV: 82.1 fL (ref 80.0–100.0)
MONO ABS: 0.8 10*3/uL (ref 0.1–1.0)
Monocytes Relative: 6 %
Neutro Abs: 11.9 10*3/uL — ABNORMAL HIGH (ref 1.7–7.7)
Neutrophils Relative %: 91 %
Platelets: 283 10*3/uL (ref 150–400)
RBC: 6.55 MIL/uL — ABNORMAL HIGH (ref 4.22–5.81)
RDW: 20.8 % — ABNORMAL HIGH (ref 11.5–15.5)
WBC: 13.1 10*3/uL — ABNORMAL HIGH (ref 4.0–10.5)
nRBC: 0 % (ref 0.0–0.2)

## 2018-11-23 LAB — PROTIME-INR
INR: 1.23
Prothrombin Time: 15.4 seconds — ABNORMAL HIGH (ref 11.4–15.2)

## 2018-11-23 LAB — COMPREHENSIVE METABOLIC PANEL
ALK PHOS: 130 U/L — AB (ref 38–126)
ALT: 46 U/L — ABNORMAL HIGH (ref 0–44)
AST: 32 U/L (ref 15–41)
Albumin: 3.2 g/dL — ABNORMAL LOW (ref 3.5–5.0)
Anion gap: 10 (ref 5–15)
BUN: 108 mg/dL — AB (ref 6–20)
CO2: 27 mmol/L (ref 22–32)
Calcium: 9.6 mg/dL (ref 8.9–10.3)
Chloride: 100 mmol/L (ref 98–111)
Creatinine, Ser: 1.42 mg/dL — ABNORMAL HIGH (ref 0.61–1.24)
GFR calc Af Amer: >60 mL/min (ref >60)
GFR, EST NON AFRICAN AMERICAN: 54 mL/min — AB (ref >60)
GLUCOSE: 395 mg/dL — AB (ref 70–99)
Potassium: 4.4 mmol/L (ref 3.5–5.1)
Sodium: 137 mmol/L (ref 135–145)
Total Bilirubin: 1.5 mg/dL — ABNORMAL HIGH (ref 0.3–1.2)
Total Protein: 8 g/dL (ref 6.5–8.1)

## 2018-11-23 LAB — GLUCOSE, CAPILLARY: Glucose-Capillary: 355 mg/dL — ABNORMAL HIGH (ref 70–99)

## 2018-11-23 MED ORDER — MIDAZOLAM HCL 2 MG/2ML IJ SOLN
INTRAMUSCULAR | Status: AC | PRN
  Administered 2018-11-23 (×3): 1 mg via INTRAVENOUS

## 2018-11-23 MED ORDER — MIDAZOLAM HCL 2 MG/2ML IJ SOLN
INTRAMUSCULAR | Status: AC
  Filled 2018-11-23: qty 6

## 2018-11-23 MED ORDER — VANCOMYCIN HCL IN DEXTROSE 1-5 GM/200ML-% IV SOLN
INTRAVENOUS | Status: AC
  Administered 2018-11-23: 1000 mg via INTRAVENOUS
  Filled 2018-11-23: qty 200

## 2018-11-23 MED ORDER — LIDOCAINE HCL 1 % IJ SOLN
INTRAMUSCULAR | Status: AC | PRN
  Administered 2018-11-23: 5 mL

## 2018-11-23 MED ORDER — IOPAMIDOL (ISOVUE-300) INJECTION 61%
50.0000 mL | Freq: Once | INTRAVENOUS | Status: AC | PRN
  Administered 2018-11-23: 30 mL

## 2018-11-23 MED ORDER — SODIUM CHLORIDE 0.9 % IV SOLN: INTRAVENOUS | Status: DC

## 2018-11-23 MED ORDER — HEPARIN SOD (PORK) LOCK FLUSH 100 UNIT/ML IV SOLN
500.0000 [IU] | INTRAVENOUS | Status: DC | PRN
  Filled 2018-11-23: qty 5

## 2018-11-23 MED ORDER — FENTANYL CITRATE (PF) 100 MCG/2ML IJ SOLN
INTRAMUSCULAR | Status: AC | PRN
  Administered 2018-11-23 (×2): 50 ug via INTRAVENOUS

## 2018-11-23 MED ORDER — IOPAMIDOL (ISOVUE-300) INJECTION 61%
INTRAVENOUS | Status: AC
  Administered 2018-11-23: 30 mL
  Filled 2018-11-23: qty 50

## 2018-11-23 MED ORDER — VANCOMYCIN HCL IN DEXTROSE 1-5 GM/200ML-% IV SOLN
1000.0000 mg | INTRAVENOUS | Status: AC
  Administered 2018-11-23: 1000 mg via INTRAVENOUS

## 2018-11-23 MED ORDER — LIDOCAINE HCL 1 % IJ SOLN
INTRAMUSCULAR | Status: AC
  Filled 2018-11-23: qty 20

## 2018-11-23 MED ORDER — SODIUM CHLORIDE 0.9% FLUSH: 5.0000 mL | Freq: Three times a day (TID) | INTRAVENOUS | Status: DC

## 2018-11-23 MED ORDER — FENTANYL CITRATE (PF) 100 MCG/2ML IJ SOLN
INTRAMUSCULAR | Status: AC
  Filled 2018-11-23: qty 4

## 2018-11-23 NOTE — Discharge Instructions (Signed)
Biliary Drainage Catheter Home Guide  A biliary drainage catheter is a thin, flexible tube that is inserted through your skin into the bile ducts in your liver. Bile is a thick yellow or green fluid that helps digest fat in foods. The purpose of a biliary drainage catheter is to keep bile from backing up into your liver. Backup of bile can occur when there is a blockage that prevents bile from moving from the bile ducts into the small intestine as it should. The blockage can be caused by gallstones, a tumor, or scar tissue. There are three types of biliary drainage:  · External biliary drainage. With this type, bile is only drained into a collection bag outside your body (external collection bag).  · Internal-external biliary drainage. With this type, bile is drained to an external collection bag as well as into your small intestine.  · Internal biliary drainage. With this type, bile is only drained into your small intestine.  General home care includes these daily actions:    · Inspection of your drainage catheter.  · Flushing your drainage catheter with saline.  · Emptying drainage from the collection bag (if present).  · Recording the amount of drainage.  · Checking the catheter insertion site for signs of infection. Check for:  ? Redness, swelling, or pain.  ? Fluid or blood.  ? Warmth.  ? Pus or a bad smell.  How do I inspect my drainage catheter?  · Check the dressing to make sure that it is dry and clean.  · Look at the skin around the drainage catheter when changing the dressing for any problems such as redness, rash, or skin breakdown.  · Check the drainage bag to make sure that drainage fluid is flowing into the bag well. Note the color and amount compared to other days.  · Check the drainage catheter and bag for any cracks or kinks in the tubing.  How do I change my dressing?  The dressing over the drainage catheter should be changed every other day, or more often if needed to keep the dressing dry. Your  health care provider will instruct you about how often to change your dressing.  Supplies needed:  · Mild soap and warm water.  · Split gauze pads, 4 x 4 inches (10 x 10 cm) to use as a dressing sponge.  · Gauze pads, 4 x 4 inches (10 x 10 cm) or adhesive dressing cover.  · Paper tape.  How to change the dressing:  1. Wash your hands with soap and water.  2. Gently remove the old dressing. Avoid using scissors to remove the dressing because they may damage the drainage catheter.  3. Wash the skin around the insertion site with mild soap and warm water, rinse well, then pat the area dry with a clean cloth.  4. Check the skin around the drainage catheter for redness or swelling, or for yellow or green discharge that has a bad smell.  5. If the drainage catheter was stitched (sutured) to the skin, inspect the suture to make sure it is still anchored in the skin.  6. Do not apply creams, ointments, or alcohol to the site. Allow the skin to air-dry completely before you apply a new dressing.  7. Place the drainage catheter through the slit in a dressing sponge. The dressing sponge should slide under the disk that holds the drainage catheter in place.  8. Cover the drainage catheter and the dressing sponge with a 4   x 4 inch (10 x 10 cm) gauze. The drainage catheter should rest on the gauze and not on the skin.  9. Tape the dressing to the skin.  10. You may be instructed to use an adhesive dressing covering over the top of this in place of the gauze and tape.  11. Wash your hands with soap and water.  How do I flush my drainage catheter?  Biliary drainagecatheters should be flushed daily, or as often as told by your health care provider. The end of the drainage catheter is closed using an IV cap. A syringe can be directly connected to the IV cap.  Supplies needed:  · Alcohol swab.  · 10 mL prefilled normal saline syringe.  How to flush the drainage catheter:  1. Wash your hands with soap and water.  2. If your drainage  catheter has a stopcock attached to it, turn the stopcock toward the drainage bag. This will allow the saline to flow in the direction of your body.  3. Clean the IV cap with an alcohol swab.  4. Screw the tip of a 10 mL normal saline syringe onto the IV cap.  5. Inject the saline over 5-10 seconds. If you feel resistance while injecting, stop immediately. Avoid  pulling back on the plunger. Doing that could increase your risk of infection.  6. Remove the syringe from the cap. Turn the stopcock so that fluid flows from your body into the drainage bag. You may notice more fluid flowing into the bag after you have completed the flush.  How do I attach a bag to my drainage catheter?  If you are having trouble with your internal biliary drain, you may be directed by your health care provider to use bag drainage until you can be seen to fix the problem. For this reason, you should always have a collection bag and connecting tubing at home. If you do not have these supplies, remember to ask for them at your next appointment.  1. Remove the bag and the connecting tubing from their packaging.  2. Connect the funnel end of the tubing to the bag's cone-shaped stem.  3. Remove the IV cap from the biliary drain. To do this, unscrew it and replace it with the screw-on end of the tubing.  4. Save the IV cap in a plastic storage bag that can be sealed.  How do I empty my collection bag?  Empty the collection bag whenever it becomes 2/3 full. Also empty it before you go to sleep. Most collection bags have a drainage valve at the bottom so the bag can be that allows them to be emptied easily.  1. Wash your hands with soap and water.  2. Hold the collection bag over the toilet, basin, or collection container. Use a measuring container if your health care provider told you to measure the drainage.  3. Unscrew the valve to open it, and allow the bag to drain.  4. Close the valve securely to avoid leakage.  5. Use a tissue or disposable  napkin to wipe the valve clean.  6. Wash the measuring container with soap and water.  7. Record the amount of drainage as told by your health care provider.  Contact a health care provider if:  · Your pain gets worse after it had improved, and it is not relieved with pain medicines.  · You have any questions about caring for your drainage catheter or collection bag.  · You have any of   these around your catheter insertion site or coming from it:  ? Skin breakdown.  ? Redness, swelling, or pain.  ? Fluid or blood.  ? Warmth to the touch.  ? Pus or a bad smell.  Get help right away if:  · You have a fever or chills.  · Your redness, swelling, or pain at the catheter insertion site gets worse, even though you are cleaning it well.  · You have leakage of bile around the drainage catheter.  · Your drainage catheter becomes blocked or clogged.  · Your drainage catheter comes out.  This information is not intended to replace advice given to you by your health care provider. Make sure you discuss any questions you have with your health care provider.  Document Released: 08/22/2013 Document Revised: 09/20/2016 Document Reviewed: 09/20/2016  [employer] Interactive Patient Education © 2019 [employer]

## 2018-11-23 NOTE — Procedures (Signed)
Pre procedural Diagnosis: Biliary Obstruction Post procedural Diagnosis: Same  Successful fluoroscopic biliary brush biopsy. Successful fluroscopic exchange of a right sided approach transhepatic10 Fr biliary drainage catheter with end coiled and locked within the duodenum.  Biliary drain capped for trial of internalization.   EBL: None  No immediate post procedural complications.  Ronny Bacon, MD Pager #: [phone]

## 2018-11-23 NOTE — H&P (Signed)
Chief Complaint: Patient was seen in consultation today for malignant biliary obstruction.  Referring Physician(s): Starla Link, [place]  Supervising Physician: Sandi Mariscal  Patient Status: [facility] - Out-pt  History of Present Illness: Darrell Sutton is a 58 y.o. 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 He was admitted to [facility] 11/07/2018 to 11/11/2018 for management of abnormal LFTs indicating biliary obstruction which is thought to be secondary to enlarged lymph nodes causing mass effect on the biliary system. He underwent an image-guided percutaneous biliary drain placement 11/09/2018 by Dr. Pascal Lux.  Patient presents today for possible image-guided cholangiogram with biliary brush biopsy. Patient awake and alert laying in bed with no complaints at this time. Denies fever, chills, chest pain, dyspnea, abdominal pain, or headache.   Past Medical History:  Diagnosis Date  . Anxiety 2009   started with divorce  . Cancer [facility])    cancer of lower third of esophagus  . Diabetes mellitus without complication ([place])   . Gastric ulcer    from NSAID overuse  . GERD (gastroesophageal reflux disease)   . Hyperlipidemia   . Hypertension   . MI (myocardial infarction) ([place]) 2011   has two stents  . Obesity    highest weight in 500's    Past Surgical History:  Procedure Laterality Date  . Ages SURGERY  2010  . core biopsy  10/11/2018   core biopsy of lymph nodes in Interventional Radiology  . GASTRIC BYPASS  11/24/2008  . GASTROJEJUNOSTOMY N/A 10/17/2018   Procedure: LAPAROSCOPIC PLACEMENT OF  LEFT FEEDING JEJUNOSTOMY TUBE;  Surgeon: Michael Boston, MD;  Location: [facility] ORS;  Service: General;  Laterality: N/A;  . IR INT EXT BILIARY DRAIN WITH CHOLANGIOGRAM  11/09/2018  . JOINT REPLACEMENT     Bilateral knee  . PORTACATH PLACEMENT Right 10/17/2018     Procedure: INSERTION PORT-A-CATH WITH ULTRASOUND AND FLUORO;  Surgeon: Michael Boston, MD;  Location: [facility] ORS;  Service: General;  Laterality: Right;  . TONSILECTOMY/ADENOIDECTOMY WITH MYRINGOTOMY      Allergies: Penicillins  Medications: Prior to Admission medications   Medication Sig Start Date End Date Taking? Authorizing Provider  cloNIDine (CATAPRES - DOSED IN MG/24 HR) 0.1 mg/24hr patch Place 1 patch (0.1 mg total) onto the skin once a week. 10/31/18  Yes Tanner, Lyndon Code., [name]  HYDROcodone-acetaminophen (HYCET) 7.5-325 mg/15 ml solution Place 15 mLs into feeding tube every 6 (six) hours as needed for up to 7 days for moderate pain. 11/22/18 11/29/18 Yes Eugenie Filler, MD  insulin NPH Human (HUMULIN N,NOVOLIN N) 100 UNIT/ML injection Inject 0.18 mLs (18 Units total) into the skin 2 (two) times daily at 8 am and 10 pm. 11/22/18  Yes Eugenie Filler, MD  LORazepam (ATIVAN) 0.5 MG tablet Place 1 tablet (0.5 mg total) under the tongue every 6 (six) hours as needed for anxiety. 10/31/18  Yes Tanner, Lyndon Code., [name]  Nutritional Supplements (FEEDING SUPPLEMENT, GLUCERNA 1.5 CAL,) LIQD Place 1,000 mLs into feeding tube continuous. Start @ 43ml/hr, increase by 38ml every 8 hours to a goal of 42ml/hr 11/11/18  Yes Aline August, MD  ondansetron [facility]) 4 MG/5ML solution Place 5 mLs (4 mg total) into feeding tube every 8 (eight) hours as needed for nausea or vomiting. 10/25/18  Yes [name], PA  bisacodyl (DULCOLAX) 10 MG suppository Place 1 suppository (10 mg total) rectally as needed for moderate constipation. 11/11/18  Aline August, MD  insulin regular (NOVOLIN R,HUMULIN R) 100 units/mL injection For glucose 121 to 150 use 3 units, for 151 to 200 use 4 units, for 201-250 use 7 units, for 251-300 use 11 units, for 301 to 350 use 15 units. FOR 351-400 use 20 units 11/22/18   Eugenie Filler, MD  metoprolol tartrate (LOPRESSOR) 25 MG tablet Take 0.5 tablets (12.5 mg total) by mouth 2 (two)  times daily. 11/22/18   Eugenie Filler, MD  polyethylene glycol [facility] Floria Raveling) packet 17 g by Per J Tube route daily as needed for mild constipation. 11/11/18   Aline August, MD  scopolamine (TRANSDERM-SCOP) 1 MG/3DAYS Place 1 patch (1.5 mg total) onto the skin every 3 (three) days. 10/31/18   Harle Stanford., [name]  Syringe, Disposable, 3 ML MISC Insulin syringes and needles. 10/18/18   Arrien, Jimmy Picket, MD  Water For Irrigation, Sterile (FREE WATER) SOLN Place 100 mLs into feeding tube every 4 (four) hours. 11/22/18   Eugenie Filler, MD     Family History  Problem Relation Age of Onset  . Hyperlipidemia Mother   . Hypertension Mother   . Hyperlipidemia Father   . Hypertension Father   . Stroke Father   . Cancer Father        NHL  . Diabetes Brother   . Heart attack Brother 12  . Hyperlipidemia Brother   . Hypertension Brother   . Heart attack Maternal Grandmother   . Heart attack Maternal Grandfather   . Early death Maternal Grandfather   . Esophageal cancer Paternal Grandmother   . Colon cancer Paternal Grandfather   . Colon cancer Cousin   . Stomach cancer Neg Hx     Social History   Socioeconomic History  . Marital status: Married    Spouse name: Not on file  . Number of children: 3  . Years of education: Not on file  . Highest education level: Not on file  Occupational History  . Not on file  Social Needs  . Financial resource strain: Not on file  . Food insecurity:    Worry: Not on file    Inability: Not on file  . Transportation needs:    Medical: No    Non-medical: No  Tobacco Use  . Smoking status: Never Smoker  . Smokeless tobacco: Never Used  Substance and Sexual Activity  . Alcohol use: Never    Frequency: Never  . Drug use: Not Currently    Types: Marijuana    Comment: Youth years- not since earlt 90's  . Sexual activity: Not on file  Lifestyle  . Physical activity:    Days per week: Not on file    Minutes per session: Not on  file  . Stress: Not on file  Relationships  . Social connections:    Talks on phone: Not on file    Gets together: Not on file    Attends religious service: Not on file    Active member of club or organization: Not on file    Attends meetings of clubs or organizations: Not on file    Relationship status: Not on file  Other Topics Concern  . Not on file  Social History Narrative   From [place], moved here Sep 2019 with his wife and son   Son with autism, starting at [employer] of Systems: A 12 point ROS discussed and pertinent positives are indicated in the HPI  above.  All other systems are negative.  Review of Systems  Constitutional: Negative for chills and fever.  Respiratory: Negative for shortness of breath and wheezing.   Cardiovascular: Negative for chest pain and palpitations.  Gastrointestinal: Negative for abdominal pain.  Neurological: Negative for headaches.    Vital Signs: There were no vitals taken for this visit.  Physical Exam Vitals signs and nursing note reviewed.  Constitutional:      General: He is not in acute distress.    Appearance: Normal appearance.  Cardiovascular:     Rate and Rhythm: Normal rate and regular rhythm.     Heart sounds: Normal heart sounds. No murmur.  Pulmonary:     Effort: Pulmonary effort is normal. No respiratory distress.     Breath sounds: Normal breath sounds. No wheezing.  Abdominal:     Comments: Biliary drain site c/d with drain intact.  Skin:    General: Skin is warm and dry.  Neurological:     Mental Status: He is alert and oriented to person, place, and time.  Psychiatric:        Mood and Affect: Mood normal.        Behavior: Behavior normal.        Thought Content: Thought content normal.        Judgment: Judgment normal.      MD Evaluation Airway: WNL Heart: WNL Abdomen: WNL Chest/ Lungs: WNL ASA  Classification: 3 Mallampati/Airway Score: One   Imaging: Dg Chest 2  View  Result Date: 11/19/2018 CLINICAL DATA:  Stage IV esophageal cancer with spread to the lymph nodes. Elevated blood gas readings. EXAM: CHEST - 2 VIEW COMPARISON:  None. FINDINGS: The heart size and mediastinal contours are within normal limits. Both lungs are clear. Port catheter tip terminates in distal SVC-RA juncture. The visualized skeletal structures are unremarkable. IMPRESSION: No active cardiopulmonary disease. Electronically Signed   By: Ashley Royalty M.D.   On: 11/19/2018 22:38   US Abdomen Complete  Result Date: 11/07/2018 CLINICAL DATA:  Jaundice, rule out biliary obstruction EXAM: ABDOMEN ULTRASOUND COMPLETE COMPARISON:  CT 08/22/2018 FINDINGS: Gallbladder: Gallbladder is distended to 6 cm in diameter. There is sludge within the gallbladder. No echogenic gallstones. Negative sonographic Murphy's sign. No pericholecystic fluid. Significant gallbladder wall thickening. Common bile duct: Diameter: Dilated 9 mm. Liver: No focal lesion identified. Within normal limits in parenchymal echogenicity. Portal vein is patent on color Doppler imaging with normal direction of blood flow towards the liver. IVC: No abnormality visualized. Pancreas: Visualized portion unremarkable. Spleen: Size and appearance within normal limits. Right Kidney: Length: 12.6 cm. Cystic lesion with thin septation is coming anechoic measures 2.3 cm. This corresponds to low-density cyst on comparison CT. Left Kidney: Length: 14.3 cm.  There is Abdominal aorta: No aneurysm visualized. Other findings: None. IMPRESSION: 1. Dilatation of the common bile duct and gallbladder without obstructing lesion identified. No gallstones are evident but there is sludge within the lumen gallbladder. Consider contrast MRCP to further evaluate etiology of potential distal biliary obstruction. 2. No intrahepatic biliary duct dilatation identified. Electronically Signed   By: Suzy Bouchard M.D.   On: 11/07/2018 14:52   Mr 3d Recon At  Scanner  Result Date: 11/07/2018 CLINICAL DATA:  Jaundice. Elevated liver function tests. History of esophageal cancer. EXAM: MRI ABDOMEN WITHOUT AND WITH CONTRAST (INCLUDING MRCP) TECHNIQUE: Multiplanar multisequence MR imaging of the abdomen was performed both before and after the administration of intravenous contrast. Heavily T2-weighted images of the biliary and pancreatic  ducts were obtained, and three-dimensional MRCP images were rendered by post processing. CONTRAST:  10 cc Gadavist COMPARISON:  Multiple exams, including ultrasound 11/07/2018 and PET-CT from 09/29/2018 FINDINGS: Lower chest: Distal esophageal wall thickening with surrounding edema noted in this patient with history of esophageal mass. There is scarring or atelectasis in the right lower lobe. Hepatobiliary: Sludge in the gallbladder. Mild intrahepatic biliary dilatation with abrupt tapered/beaked appearance of the common bile duct approximately corresponding to its course along the pancreatic head. No well-defined filling defect. The common hepatic duct measures about 8 mm in diameter proximal to this area of narrowing. In the area of narrowing there is subtly accentuated enhancement in the expected vicinity of the dorsal pancreatic duct. The appearance is not classic for pancreatic malignancy, there is no associated hypoenhancement in this region. Pancreas: No discrete pancreatic mass is identified. As noted above, the narrowed segment of the CBD in the vicinity of the pancreatic head may potentially have faintly associated accentuated enhancement. Spleen:  Unremarkable Adrenals/Urinary Tract: Right mid kidney Bosniak category 2 cyst anteriorly. Otherwise unremarkable. Adrenal glands normal. Stomach/Bowel: There is wall thickening in the gastric cardia with surrounding stranding and indistinctness of tissue planes. Jejunostomy tube noted. Vascular/Lymphatic: A porta hepatis node measures 1.6 cm in short axis on image 48/1102. Another  porta hepatis node measures 1.2 cm in short axis on image 45/1102. Smaller periaortic lymph nodes are present and there are small lymph nodes around the distal esophagus and gastric cardia. Other:  No supplemental non-categorized findings. Musculoskeletal: Unremarkable IMPRESSION: 1. Marked narrowing in a 2.3 cm segment of the common bile duct corresponding with the segment of the duct along the pancreas. I do not see a definite pancreatic lesion but there is some faintly accentuated enhancement in the CBD in this vicinity. This could be from localized inflammation/cholangitis or a stricture. The CBD proximal to this stricture is 0.8 cm in diameter. Sludge noted in the gallbladder without definite stones. 2. Distal esophageal wall thickening and thickening in the proximal stomach. There are also postoperative findings in the stomach. Surrounding inflammatory stranding is noted around the distal esophagus and proximal stomach, possibly therapy related. Surrounding small lymph nodes noted along with enlarged lymph nodes in the porta hepatis which are nonspecific and could be reactive or malignant. 3. Scarring or atelectasis in the right lower lobe. Electronically Signed   By: Van Clines M.D.   On: 11/07/2018 19:06   Mr Abdomen Mrcp Moise Boring Contast  Result Date: 11/07/2018 CLINICAL DATA:  Jaundice. Elevated liver function tests. History of esophageal cancer. EXAM: MRI ABDOMEN WITHOUT AND WITH CONTRAST (INCLUDING MRCP) TECHNIQUE: Multiplanar multisequence MR imaging of the abdomen was performed both before and after the administration of intravenous contrast. Heavily T2-weighted images of the biliary and pancreatic ducts were obtained, and three-dimensional MRCP images were rendered by post processing. CONTRAST:  10 cc Gadavist COMPARISON:  Multiple exams, including ultrasound 11/07/2018 and PET-CT from 09/29/2018 FINDINGS: Lower chest: Distal esophageal wall thickening with surrounding edema noted in this  patient with history of esophageal mass. There is scarring or atelectasis in the right lower lobe. Hepatobiliary: Sludge in the gallbladder. Mild intrahepatic biliary dilatation with abrupt tapered/beaked appearance of the common bile duct approximately corresponding to its course along the pancreatic head. No well-defined filling defect. The common hepatic duct measures about 8 mm in diameter proximal to this area of narrowing. In the area of narrowing there is subtly accentuated enhancement in the expected vicinity of the dorsal pancreatic duct. The  appearance is not classic for pancreatic malignancy, there is no associated hypoenhancement in this region. Pancreas: No discrete pancreatic mass is identified. As noted above, the narrowed segment of the CBD in the vicinity of the pancreatic head may potentially have faintly associated accentuated enhancement. Spleen:  Unremarkable Adrenals/Urinary Tract: Right mid kidney Bosniak category 2 cyst anteriorly. Otherwise unremarkable. Adrenal glands normal. Stomach/Bowel: There is wall thickening in the gastric cardia with surrounding stranding and indistinctness of tissue planes. Jejunostomy tube noted. Vascular/Lymphatic: A porta hepatis node measures 1.6 cm in short axis on image 48/1102. Another porta hepatis node measures 1.2 cm in short axis on image 45/1102. Smaller periaortic lymph nodes are present and there are small lymph nodes around the distal esophagus and gastric cardia. Other:  No supplemental non-categorized findings. Musculoskeletal: Unremarkable IMPRESSION: 1. Marked narrowing in a 2.3 cm segment of the common bile duct corresponding with the segment of the duct along the pancreas. I do not see a definite pancreatic lesion but there is some faintly accentuated enhancement in the CBD in this vicinity. This could be from localized inflammation/cholangitis or a stricture. The CBD proximal to this stricture is 0.8 cm in diameter. Sludge noted in the  gallbladder without definite stones. 2. Distal esophageal wall thickening and thickening in the proximal stomach. There are also postoperative findings in the stomach. Surrounding inflammatory stranding is noted around the distal esophagus and proximal stomach, possibly therapy related. Surrounding small lymph nodes noted along with enlarged lymph nodes in the porta hepatis which are nonspecific and could be reactive or malignant. 3. Scarring or atelectasis in the right lower lobe. Electronically Signed   By: Van Clines M.D.   On: 11/07/2018 19:06   Ir Int Lianne Cure Biliary Drain With Cholangiogram  Result Date: 11/09/2018 INDICATION: History of metastatic esophageal cancer, now with malignant biliary obstruction with associated jaundice and elevated bilirubin levels, presumably secondary to porta hepatis adenopathy. Patient with history of prior gastric bypass surgery and as such is not a candidate for attempted ERCP. Request made for placement of a percutaneous biliary drainage catheter for malignant biliary obstruction. EXAM: ULTRASOUND AND FLUOROSCOPIC GUIDED PERCUTANEOUS TRANSHEPATIC CHOLANGIOGRAM AND BILIARY TUBE PLACEMENT COMPARISON:  MRCP - 11/07/2018 MEDICATIONS: Vancomycin 1 gm IV; the antibiotic was administered within an appropriate time frame prior to the initiation of the procedure. CONTRAST:  20 cc Isovue-300, administered into biliary tree ANESTHESIA/SEDATION: Moderate (conscious) sedation was employed during this procedure. A total of Versed 4 mg, Dilaudid 3 mg IV and Fentanyl 50 mcg was administered intravenously. Moderate Sedation Time: 36 minutes. The patient's level of consciousness and vital signs were monitored continuously by radiology nursing throughout the procedure under my direct supervision. FLUOROSCOPY TIME:  6 minutes (510 mGy) COMPLICATIONS: None immediate. TECHNIQUE: Informed written consent was obtained from the patient and the patient's wife after a discussion of the risks,  benefits and alternatives to treatment. Questions regarding the procedure were encouraged and answered. A timeout was performed prior to the initiation of the procedure. The right upper abdominal quadrant was prepped and draped in the usual sterile fashion, and a sterile drape was applied covering the operative field. Maximum barrier sterile technique with sterile gowns and gloves were used for the procedure. A timeout was performed prior to the initiation of the procedure. Ultrasound scanning was performed of the midline of the upper abdomen demonstrated very minimal left-sided intrahepatic biliary ductal dilatation. After lying soft tissues were anesthetized with 1% lidocaine with epinephrine, two attempts were made access the left biliary  tree under direct ultrasound fluoroscopic guided however this ultimately proved unsuccessful secondary to lack of significant left-sided intrahepatic biliary duct dilatation. As such, a spot along the right mid axillary line was marked fluoroscopically inferior to the right costophrenic angle. A peripheral very mildly dilated duct within the anterior aspect of the right lobe of the liver was targeted under direct ultrasound guidance after the overlying soft tissues were anesthetized with 1% lidocaine with epinephrine. Contrast injection demonstrates intra biliary puncture. The access bile duct was cannulated with a Nitrex wire which was advanced to the central aspect of the biliary tree. Tract was dilated with the inner 3 French catheter of the [place] set. Contrast injection confirmed appropriate peripheral access. Next, a Nitrex wire was advanced to the level of the duodenum. The track was dilated with the remainder of the [place] set. A 4 French angled glide catheter was advanced through the outer sheath the [place] set to the level of the duodenum. Contrast injection confirmed appropriate positioning Next, over a Amplatz wire, the tract was dilated and a 10.2 [place]  biliary drainage catheter was advanced with coil ultimately locked within the duodenum. Contrast was injected and a completion radiograph was obtained. The catheter was connected to a drainage bag which yielded the brisk return of dark black colored bile. The catheter was secured to the skin with an interrupted suture. The patient tolerated the procedure well without immediate postprocedural complication. FINDINGS: Sonographic evaluation demonstrates very mild dilatation of the left intrahepatic biliary tree. Attempted though ultimately unsuccessful attempted left-sided biliary drainage catheter placement secondary lack of significant left-sided intrahepatic biliary ductal dilatation. Sonographic evaluation demonstrates mild dilatation of the right intrahepatic biliary tree which was ultimately successfully opacified and cannulated, allowing placement of a 10 French percutaneous biliary drainage catheter via the peripheral aspect of a very mildly dilated bile duct within the anterior segment of the right lobe of the liver with end coiled and locked within the duodenum. The radiopaque marker is located within the central aspect of the right anterior biliary tree. IMPRESSION: Successful placement of a 10.2 French percutaneous biliary drainage catheter with end coiled and locked within the duodenum. PLAN: - Biliary brush biopsy and formal cholangiogram could be performed following normalization of the patient's LFTs as clinically indicated. - Otherwise, fluoroscopic guided biliary stent placement could be performed in 4-6 weeks as indicated. Electronically Signed   By: Sandi Mariscal M.D.   On: 11/09/2018 17:18    Labs:  CBC: Recent Labs    11/10/18 0309 11/11/18 0340 11/19/18 2316 11/20/18 0400  WBC 11.4* 9.9 14.5* 11.6*  HGB 13.3 13.0 16.1 16.2  HCT 44.4 43.7 54.0* 54.5*  PLT 235 233 299 232    COAGS: Recent Labs    10/11/18 1134 11/07/18 1600 11/08/18 0500  INR 1.08 1.11 1.16    BMP: Recent  Labs    11/20/18 1135 11/20/18 1520 11/21/18 0345 11/22/18 0619  NA 143 139 139 140  K 5.5* 5.6* 4.3 3.8  CL 101 102 101 106  CO2 30 26 28 23   GLUCOSE 187* 366* 356* 231*  BUN 72* 76* 75* 98*  CALCIUM 10.0 9.2 9.4 8.2*  CREATININE 1.11 1.16 1.13 1.22  GFRNONAA >60 >60 >60 >60  GFRAA >60 >60 >60 >60    LIVER FUNCTION TESTS: Recent Labs    11/09/18 0342 11/10/18 0309 11/11/18 0340 11/19/18 2316  BILITOT 8.9* 4.9* 3.5* 1.7*  AST 146* 86* 47* 22  ALT 313* 266* 192* 48*  ALKPHOS 452* 413* 344*  174*  PROT 6.3* 6.4* 6.6 8.5*  ALBUMIN 2.8* 2.9* 2.8* 3.3*    TUMOR MARKERS: No results for input(s): AFPTM, CEA, CA199, CHROMGRNA in the last 8760 hours.  Assessment and Plan:  Malignant biliary obstruction. Plan for image-guided cholangiogram with biliary brush biopsy today with Dr. Pascal Lux. Patient is NPO. Afebrile. He does not take blood thinners. INR 1.23 seconds today.  Risks and benefits discussed with the patient including, but not limited to bleeding, infection, damage to adjacent structures or low yield requiring additional tests. Risks and benefits of cholangiogram discussed with the patient including, but not limited to bleeding, infection which may lead to sepsis or even death and damage to adjacent structures. This interventional procedure involves the use of X-rays and because of the nature of the planned procedure, it is possible that we will have prolonged use of X-ray fluoroscopy. Potential radiation risks to you include (but are not limited to) the following: - A slightly elevated risk for cancer  several years later in life. This risk is typically less than 0.5% percent. This risk is low in comparison to the normal incidence of human cancer, which is 33% for women and 50% for men according to the [place]. - Radiation induced injury can include skin redness, resembling a rash, tissue breakdown / ulcers and hair loss (which can be temporary or  permanent).  The likelihood of either of these occurring depends on the difficulty of the procedure and whether you are sensitive to radiation due to previous procedures, disease, or genetic conditions.  IF your procedure requires a prolonged use of radiation, you will be notified and given written instructions for further action.  It is your responsibility to monitor the irradiated area for the 2 weeks following the procedure and to notify your physician if you are concerned that you have suffered a radiation induced injury.   All of the patient's questions were answered, patient is agreeable to proceed. Consent signed and in chart.   Thank you for this interesting consult.  I greatly enjoyed meeting Darrell Sutton and look forward to participating in their care.  A copy of this report was sent to the requesting provider on this date.  Electronically Signed: Earley Abide, [name] 11/23/2018, 9:39 AM   I spent a total of 25 Minutes in face to face in clinical consultation, greater than 50% of which was counseling/coordinating care for malignant biliary obstruction.

## 2018-11-27 NOTE — Progress Notes (Signed)
[place]   Telephone:([phone] Fax:(336) [phone]   Clinic Follow up Note   Patient Care Team: [name], [place] as PCP - General (Physician Assistant) [name], MD as PCP - Cardiology (Cardiology) Truitt Merle, MD as Consulting Physician (Medical Oncology) [name] Kirke Corin, MD as Consulting Physician (Gastroenterology) [name], MD as Consulting Physician (Radiation Oncology) 11/28/2018  CHIEF COMPLAINT: F/u on esophageal cancer   SUMMARY OF ONCOLOGIC HISTORY: Oncology History   Cancer Staging Cancer of lower third of esophagus [facility]) Staging form: Esophagus - Adenocarcinoma, AJCC 8th Edition - Clinical stage from 10/11/2018: Stage IVB (cTX, cN2, pM1) - Signed by Truitt Merle, MD on 10/17/2018       Cancer of lower third of esophagus ([place])   09/20/2018 Procedure    Upper Endoscopy by Dr. [name] 09/20/18  IMPRESSION - Normal larynx. - Partially obstructing, likely malignant esophageal tumor was found in the lower third of the esophagus. Biopsied. - Gastric bypass with a normal-sized pouch and gastrojejunal anastomosis characterized by ulceration. - Normal examined jejunum.    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 Focal hypermetabolism in the distal esophagus is consistent with the patient's known malignancy. 2. Areas of ill-defined upper normal to mildly enlarged lymph nodes identified in the left supraclavicular region, left thoracic inlet, upper abdomen, and para-aortic retroperitoneal space. These areas also show stranding around the lymph nodes. Low level FDG accumulation in these lymph nodes is borderline for infectious versus neoplastic etiology. While metastatic  disease is certainly a consideration, infectious/inflammatory etiology is also possible and lymphoma cannot be excluded.    09/30/2018 PET scan    09/30/2018 PET IMPRESSION: 1. Focal hypermetabolism in the distal esophagus is consistent with the patient's known malignancy. 2. Areas of ill-defined upper normal to mildly enlarged lymph nodes identified in the left supraclavicular region, left thoracic inlet, upper abdomen, and para-aortic retroperitoneal space. These areas also show stranding around the lymph nodes. Low level FDG accumulation in these lymph nodes is borderline for infectious versus neoplastic etiology. While metastatic disease is certainly a consideration, infectious/inflammatory etiology is also possible and lymphoma cannot be excluded.     10/11/2018 Imaging    10/11/2018 [place] Core Biopsy FINDINGS: Bilobed abnormal lymph node or adjacent lymph nodes in the left supraclavicular region. This bilobed lymphadenopathy was successfully biopsied. Needle position was confirmed within this lesion.  IMPRESSION: Ultrasound-guided left supraclavicular lymph node biopsy.    10/11/2018 Cancer Staging    Staging form: Esophagus - Adenocarcinoma, AJCC 8th Edition - Clinical stage from 10/11/2018: Stage IVB (cTX, cN2, pM1) - Signed by Truitt Merle, MD on 10/17/2018    10/11/2018 Pathology Results    Left supraclavicular lymph node biopsy showed metastatic adenocarcinoma.     10/23/2018 - 11/09/2018 Radiation Therapy    With Dr. Lisbeth Renshaw    11/07/2018 -  Chemotherapy     (HERCEPTIN)    11/07/2018 Imaging    11/07/2018 MRI Abdomen IMPRESSION: 1. Marked narrowing in a 2.3 cm segment of the common bile duct corresponding with the segment of the duct along the pancreas. I do not see a definite pancreatic lesion but there is some faintly accentuated enhancement in the CBD in this vicinity. This could be  from localized inflammation/cholangitis or a stricture. The CBD proximal to  this stricture is 0.8 cm in diameter. Sludge noted in the gallbladder without definite stones. 2. Distal esophageal wall thickening and thickening in the proximal stomach. There are also postoperative findings in the stomach. Surrounding inflammatory stranding is noted around the distal esophagus and proximal stomach, possibly therapy related. Surrounding small lymph nodes noted along with enlarged lymph nodes in the porta hepatis which are nonspecific and could be reactive or malignant. 3. Scarring or atelectasis in the right lower lobe.    11/07/2018 Imaging    11/07/2018 US Abdomen IMPRESSION: 1. Dilatation of the common bile duct and gallbladder without obstructing lesion identified. No gallstones are evident but there is sludge within the lumen gallbladder. Consider contrast MRCP to further evaluate etiology of potential distal biliary obstruction. 2. No intrahepatic biliary duct dilatation identified.    11/07/2018 - 11/11/2018 Hospital Admission    Admit date: 11/07/2018 Admission diagnosis: Obstructive Jaundice Additional comments: PBD placement done    11/09/2018 Procedure    11/09/2018 PBD IMPRESSION: Successful placement of a 10.2 French percutaneous biliary drainage catheter with end coiled and locked within the duodenum.    11/13/2018 -  Chemotherapy    FOLFOX    11/19/2018 - 11/22/2018 Hospital Admission    Admit date: 11/19/2018 Admission diagnosis: Hyperglycemia Additional comments:      CURRENT THERAPY FOLFOX pending   INTERVAL HISTORY: Darrell Sutton is a 58 y.o. male who is here for follow-up. Since our last visit, he was admitted to the hospital for obstructive jaundice and underwent percutaneous biliary drainage placement. He was readmitted for hyperglycemia shortly after his initial admission. Today, he is here with his wife. He is on a wheelchair and is holding a plastic bag to spit as needed. He has a rash on his arms and forearms that he says has been  there since his hospital admission. He denies bleeding from gum or nose. He also complains of nausea for which he takes Zofran. His swallowing is improving and he is able to take soft food and smoothies. He has also been coughing a lot and says that his nebulizer is missing a piece and he doesn't have a rescue inhaler. His cough is productive of clear sputum, but denies fever. He reports lower abdominal cramps that happen 3-4 times daily and last 10-15 minutes. His BM are regular and he denies diarrhea or constipation. He requires pain medications. He takes 2 cans of nutrition and 1 can of water 3 times daily. He monitors his BG at home and has a log of records.   Pertinent positives and negatives of review of systems are listed and detailed within the above HPI.  REVIEW OF SYSTEMS:   Constitutional: Denies fevers, chills or abnormal weight loss Eyes: Denies blurriness of vision Ears, nose, mouth, throat, and face: Denies mucositis or sore throat Respiratory: Denies dyspnea or wheezes (+) productive cough Cardiovascular: Denies palpitation, chest discomfort or lower extremity swelling Gastrointestinal:  Denies heartburn or change in bowel habits (+) nausea (+) lower abdominal cramps Skin: (+) rash on his arms and forearms Lymphatics: Denies new lymphadenopathy or easy bruising Neurological:Denies numbness, tingling or new weaknesses Behavioral/Psych: Mood is stable, no new changes  All other systems were reviewed with the patient and are negative.  MEDICAL HISTORY:  Past Medical History:  Diagnosis Date  . Anxiety 2009   started with divorce  . Cancer [facility])    cancer of lower third of esophagus  . Diabetes  mellitus without complication ([place])   . Gastric ulcer    from NSAID overuse  . GERD (gastroesophageal reflux disease)   . Hyperlipidemia   . Hypertension   . MI (myocardial infarction) ([place]) 2011   has two stents  . Obesity    highest weight in 500's    SURGICAL HISTORY: Past  Surgical History:  Procedure Laterality Date  . [place] SURGERY  2010  . core biopsy  10/11/2018   core biopsy of lymph nodes in Interventional Radiology  . GASTRIC BYPASS  11/24/2008  . GASTROJEJUNOSTOMY N/A 10/17/2018   Procedure: LAPAROSCOPIC PLACEMENT OF  LEFT FEEDING JEJUNOSTOMY TUBE;  Surgeon: Michael Boston, MD;  Location: [facility];  Service: General;  Laterality: N/A;  . IR ENDOLUMINAL BX OF BILIARY TREE  11/23/2018  . IR EXCHANGE BILIARY DRAIN  11/23/2018  . IR INT EXT BILIARY DRAIN WITH CHOLANGIOGRAM  11/09/2018  . JOINT REPLACEMENT     Bilateral knee  . PORTACATH PLACEMENT Right 10/17/2018   Procedure: INSERTION PORT-A-CATH WITH ULTRASOUND AND FLUORO;  Surgeon: Michael Boston, MD;  Location: [facility];  Service: General;  Laterality: Right;  . TONSILECTOMY/ADENOIDECTOMY WITH MYRINGOTOMY      I have reviewed the social history and family history with the patient and they are unchanged from previous note.  ALLERGIES:  is allergic to penicillins.  MEDICATIONS:  Current Outpatient Medications  Medication Sig Dispense Refill  . bisacodyl (DULCOLAX) 10 MG suppository Place 1 suppository (10 mg total) rectally as needed for moderate constipation. 12 suppository 0  . cloNIDine (CATAPRES - DOSED IN MG/24 HR) 0.1 mg/24hr patch Place 1 patch (0.1 mg total) onto the skin once a week. 4 patch 12  . HYDROcodone-acetaminophen (HYCET) 7.5-325 mg/15 ml solution Place 15 mLs into feeding tube every 6 (six) hours as needed for up to 7 days for moderate pain. 473 mL 0  . insulin NPH Human (HUMULIN N,NOVOLIN N) 100 UNIT/ML injection Inject 0.18 mLs (18 Units total) into the skin 2 (two) times daily at 8 am and 10 pm. 10 mL 1  . insulin regular (NOVOLIN R,HUMULIN R) 100 units/mL injection For glucose 121 to 150 use 3 units, for 151 to 200 use 4 units, for 201-250 use 7 units, for 251-300 use 11 units, for 301 to 350 use 15 units. FOR 351-400 use 20 units 10 mL 0  . LORazepam (ATIVAN) 0.5 MG tablet Place 1  tablet (0.5 mg total) under the tongue every 6 (six) hours as needed for anxiety. 40 tablet 2  . Nutritional Supplements (FEEDING SUPPLEMENT, GLUCERNA 1.5 CAL,) LIQD Place 1,000 mLs into feeding tube continuous. Start @ 56ml/hr, increase by 67ml every 8 hours to a goal of 39ml/hr 1000 mL 30  . polyethylene glycol (MIRALAX / GLYCOLAX) packet 17 g by Per J Tube route daily as needed for mild constipation. 14 each 0  . scopolamine (TRANSDERM-SCOP) 1 MG/3DAYS Place 1 patch (1.5 mg total) onto the skin every 3 (three) days. 10 patch 5  . Syringe, Disposable, 3 ML MISC Insulin syringes and needles. 100 each 0  . Water For Irrigation, Sterile (FREE WATER) SOLN Place 100 mLs into feeding tube every 4 (four) hours.    Marland Kitchen albuterol (PROVENTIL HFA;VENTOLIN HFA) 108 (90 Base) MCG/ACT inhaler Inhale 2 puffs into the lungs every 6 (six) hours as needed for wheezing or shortness of breath. 1 Inhaler 2  . dicyclomine (BENTYL) 10 MG capsule Take 1 capsule (10 mg total) by mouth 4 (four) times daily -  before  meals and at bedtime. 30 capsule 1  . metoprolol tartrate (LOPRESSOR) 25 MG tablet Take 0.5 tablets (12.5 mg total) by mouth 2 (two) times daily. (Patient not taking: Reported on 11/28/2018) 30 tablet 0  . ondansetron (ZOFRAN) 4 MG/5ML solution Place 5 mLs (4 mg total) into feeding tube every 8 (eight) hours as needed for nausea or vomiting. 50 mL 1  . prochlorperazine (COMPAZINE) 10 MG tablet Take 1 tablet (10 mg total) by mouth every 6 (six) hours as needed for nausea or vomiting. 30 tablet 2   No current facility-administered medications for this visit.     PHYSICAL EXAMINATION: ECOG PERFORMANCE STATUS: 3 - Symptomatic, >50% confined to bed  Vitals:   11/28/18 1131  BP: 128/82  Pulse: 100  Resp: 18  Temp: 97.8 F (36.6 C)  SpO2: 98%   Filed Weights   11/28/18 1131  Weight: 255 lb 6.4 oz (115.8 kg)    GENERAL:alert, no distress and comfortable (+) on a wheelchair SKIN: skin color, texture, turgor  are normal, no rashes or significant lesions EYES: normal, Conjunctiva are pink and non-injected, sclera clear OROPHARYNX:no exudate, no erythema and lips, buccal mucosa, and tongue normal (+) coughing (+) spitting into a plastic bag NECK: supple, thyroid normal size, non-tender, without nodularity LYMPH:  no palpable lymphadenopathy in the cervical, axillary or inguinal LUNGS: clear to auscultation and percussion with normal breathing effort HEART: regular rate & rhythm and no murmurs and no lower extremity edema ABDOMEN:abdomen soft, non-tender and normal bowel sounds (+0 drainage tubes  Musculoskeletal:no cyanosis of digits and no clubbing  NEURO: alert & oriented x 3 with fluent speech, no focal motor/sensory deficits  LABORATORY DATA:  I have reviewed the data as listed CBC Latest Ref Rng & Units 11/28/2018 11/23/2018 11/20/2018  WBC 4.0 - 10.5 K/uL 16.4(H) 13.1(H) 11.6(H)  Hemoglobin 13.0 - 17.0 g/dL 15.3 16.3 16.2  Hematocrit 39.0 - 52.0 % 51.6 53.8(H) 54.5(H)  Platelets 150 - 400 K/uL 242 283 232     CMP Latest Ref Rng & Units 11/28/2018 11/23/2018 11/22/2018  Glucose 70 - 99 mg/dL 254(H) 395(H) 231(H)  BUN 6 - 20 mg/dL 60(H) 108(H) 98(H)  Creatinine 0.61 - 1.24 mg/dL 1.14 1.42(H) 1.22  Sodium 135 - 145 mmol/L 150(H) 137 140  Potassium 3.5 - 5.1 mmol/L 4.8 4.4 3.8  Chloride 98 - 111 mmol/L 107 100 106  CO2 22 - 32 mmol/L 34(H) 27 23  Calcium 8.9 - 10.3 mg/dL 9.4 9.6 8.2(L)  Total Protein 6.5 - 8.1 g/dL 7.3 8.0 -  Total Bilirubin 0.3 - 1.2 mg/dL 0.9 1.5(H) -  Alkaline Phos 38 - 126 U/L 196(H) 130(H) -  AST 15 - 41 U/L 28 32 -  ALT 0 - 44 U/L 57(H) 46(H) -   PROCEDURES  11/09/2018 PBD IMPRESSION: Successful placement of a 10.2 French percutaneous biliary drainage catheter with end coiled and locked within the duodenum.   RADIOGRAPHIC STUDIES: I have personally reviewed the radiological images as listed and agreed with the findings in the report.  11/07/2018 MRI  Abdomen IMPRESSION: 1. Marked narrowing in a 2.3 cm segment of the common bile duct corresponding with the segment of the duct along the pancreas. I do not see a definite pancreatic lesion but there is some faintly accentuated enhancement in the CBD in this vicinity. This could be from localized inflammation/cholangitis or a stricture. The CBD proximal to this stricture is 0.8 cm in diameter. Sludge noted in the gallbladder without definite stones. 2. Distal  esophageal wall thickening and thickening in the proximal stomach. There are also postoperative findings in the stomach. Surrounding inflammatory stranding is noted around the distal esophagus and proximal stomach, possibly therapy related. Surrounding small lymph nodes noted along with enlarged lymph nodes in the porta hepatis which are nonspecific and could be reactive or malignant. 3. Scarring or atelectasis in the right lower lobe.  11/07/2018 US Abdomen IMPRESSION: 1. Dilatation of the common bile duct and gallbladder without obstructing lesion identified. No gallstones are evident but there is sludge within the lumen gallbladder. Consider contrast MRCP to further evaluate etiology of potential distal biliary obstruction. 2. No intrahepatic biliary duct dilatation identified.    ASSESSMENT & PLAN:  Darrell Sutton is a 58 y.o. 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 Due to significant dysphagia, he was initially treated with palliative radiation first. Currently planned to start FOLFOX when he recovers well. He is also on PEG tube feeding due to significant dysphagia.  -He recently underwent percutaneous biliary draining tube placement for obstructive jaundice -He has been dealing with poorly controlled hyperglycemia lately, was recently hospitalized. -Labs reviewed, CBC showed Hg 15.3 WBC 16.4 Neutro Abs 15.3. CMP showed BG 254 NA 150 AST 28 ALT 57 AKP  196. -He is experiencing nausea, lower abdominal cramps, productive cough, and rash on his arms and forearms. He still has difficulties swallowing and is constantly spitting and drinking slowly, even though he states that his swallowing is overall improving, and he is able to drink clear liquid slowly now -He is out of his albuterol. I will refill today. I also gave him a prescription for nebulizer to replace his broken one at home  -f/u in 2 weeks, to see if he is ready to start chemo   2.  Transaminitis and hyperbilirubinemia -He developed obstructive jaundice lately, status post percutaneous biliary drainage tube placement, brushing was negative for malignant cells.  No liver mets on CT  -Etiology unclear, possible related to regional metastatic adenopathy  labs reviewed, CMP showed AST 28 ALT 57 AKP 196. -He has bilateral percutaneous biliary draining tube has been capped, he will f/u with IR for stent placement    3. Type 2 DMwith uncontrolled hyperglycemia -Currently on insulin. Continue and f/u with dietician. -I reviewed his BG log and discussed close monitoring of his BG at home and advised him to increase his NPH insulin from 18u to 25u and continue ISS. I also advised him to see his PCP for this. I informed him about symptoms of hypoglycemia and advised him to call for any concerns. -BG today is 254  4.Hypertension -Currently on liquid medications   5. Insomnia -Currently on Ativan 0.5 mg at bedtime as needed. Continue.  6. Nausea and abdominal cramps  -Currently uses zofran, I called in Compazine -I also called in Bentyl for him     7. History of gastric by-pass surgery    Plan  -f/u in 2 weeks with labs and port flush -I refilled Compazine, albuterol, and bentyl -increased his NPH, he will f/u with his new PCP soon   No problem-specific Assessment & Plan notes found for this encounter.   No orders of the defined types were placed in this encounter.  All  questions were answered. The patient knows to call the clinic with any problems, questions or concerns. No barriers to learning was detected. I spent 25 minutes counseling the patient face to face. The total time spent  in the appointment was 30 minutes and more than 50% was on counseling and review of test results  I, Noor Dweik am acting as scribe for Dr. Truitt Merle.  I have reviewed the above documentation for accuracy and completeness, and I agree with the above.     Truitt Merle, MD 11/28/2018

## 2018-11-28 ENCOUNTER — Other Ambulatory Visit: Payer: Self-pay

## 2018-11-28 ENCOUNTER — Inpatient Hospital Stay (HOSPITAL_BASED_OUTPATIENT_CLINIC_OR_DEPARTMENT_OTHER): Payer: Self-pay | Admitting: Hematology

## 2018-11-28 ENCOUNTER — Inpatient Hospital Stay: Payer: Self-pay | Attending: Hematology

## 2018-11-28 ENCOUNTER — Inpatient Hospital Stay: Payer: Self-pay

## 2018-11-28 ENCOUNTER — Encounter: Payer: Self-pay | Admitting: Hematology

## 2018-11-28 VITALS — BP 128/82 | HR 100 | Temp 97.8°F | Resp 18 | Ht 72.0 in | Wt 255.4 lb

## 2018-11-28 DIAGNOSIS — C155 Malignant neoplasm of lower third of esophagus: Secondary | ICD-10-CM

## 2018-11-28 DIAGNOSIS — R21 Rash and other nonspecific skin eruption: Secondary | ICD-10-CM | POA: Insufficient documentation

## 2018-11-28 DIAGNOSIS — G4700 Insomnia, unspecified: Secondary | ICD-10-CM

## 2018-11-28 DIAGNOSIS — I10 Essential (primary) hypertension: Secondary | ICD-10-CM | POA: Insufficient documentation

## 2018-11-28 DIAGNOSIS — C778 Secondary and unspecified malignant neoplasm of lymph nodes of multiple regions: Secondary | ICD-10-CM | POA: Insufficient documentation

## 2018-11-28 DIAGNOSIS — E1165 Type 2 diabetes mellitus with hyperglycemia: Secondary | ICD-10-CM

## 2018-11-28 DIAGNOSIS — Z9884 Bariatric surgery status: Secondary | ICD-10-CM

## 2018-11-28 DIAGNOSIS — R110 Nausea: Secondary | ICD-10-CM | POA: Insufficient documentation

## 2018-11-28 DIAGNOSIS — E669 Obesity, unspecified: Secondary | ICD-10-CM

## 2018-11-28 DIAGNOSIS — Z95828 Presence of other vascular implants and grafts: Secondary | ICD-10-CM

## 2018-11-28 DIAGNOSIS — E119 Type 2 diabetes mellitus without complications: Secondary | ICD-10-CM

## 2018-11-28 LAB — CBC WITH DIFFERENTIAL (CANCER CENTER ONLY)
Abs Immature Granulocytes: 0.11 10*3/uL — ABNORMAL HIGH (ref 0.00–0.07)
Basophils Absolute: 0 10*3/uL (ref 0.0–0.1)
Basophils Relative: 0 %
EOS PCT: 0 %
Eosinophils Absolute: 0 10*3/uL (ref 0.0–0.5)
HCT: 51.6 % (ref 39.0–52.0)
Hemoglobin: 15.3 g/dL (ref 13.0–17.0)
Immature Granulocytes: 1 %
LYMPHS PCT: 1 %
Lymphs Abs: 0.2 10*3/uL — ABNORMAL LOW (ref 0.7–4.0)
MCH: 24.4 pg — ABNORMAL LOW (ref 26.0–34.0)
MCHC: 29.7 g/dL — AB (ref 30.0–36.0)
MCV: 82.4 fL (ref 80.0–100.0)
Monocytes Absolute: 0.8 10*3/uL (ref 0.1–1.0)
Monocytes Relative: 5 %
Neutro Abs: 15.3 10*3/uL — ABNORMAL HIGH (ref 1.7–7.7)
Neutrophils Relative %: 93 %
Platelet Count: 242 10*3/uL (ref 150–400)
RBC: 6.26 MIL/uL — ABNORMAL HIGH (ref 4.22–5.81)
RDW: 21.2 % — ABNORMAL HIGH (ref 11.5–15.5)
WBC Count: 16.4 10*3/uL — ABNORMAL HIGH (ref 4.0–10.5)
nRBC: 0 % (ref 0.0–0.2)

## 2018-11-28 LAB — CMP (CANCER CENTER ONLY)
ALBUMIN: 2.7 g/dL — AB (ref 3.5–5.0)
ALT: 57 U/L — ABNORMAL HIGH (ref 0–44)
ANION GAP: 9 (ref 5–15)
AST: 28 U/L (ref 15–41)
Alkaline Phosphatase: 196 U/L — ABNORMAL HIGH (ref 38–126)
BUN: 60 mg/dL — ABNORMAL HIGH (ref 6–20)
CO2: 34 mmol/L — ABNORMAL HIGH (ref 22–32)
Calcium: 9.4 mg/dL (ref 8.9–10.3)
Chloride: 107 mmol/L (ref 98–111)
Creatinine: 1.14 mg/dL (ref 0.61–1.24)
GFR, Est AFR Am: >60 mL/min (ref >60)
GFR, Estimated: >60 mL/min (ref >60)
Glucose, Bld: 254 mg/dL — ABNORMAL HIGH (ref 70–99)
Potassium: 4.8 mmol/L (ref 3.5–5.1)
Sodium: 150 mmol/L — ABNORMAL HIGH (ref 135–145)
Total Bilirubin: 0.9 mg/dL (ref 0.3–1.2)
Total Protein: 7.3 g/dL (ref 6.5–8.1)

## 2018-11-28 MED ORDER — ALBUTEROL SULFATE HFA 108 (90 BASE) MCG/ACT IN AERS: 2.0000 | INHALATION_SPRAY | Freq: Four times a day (QID) | RESPIRATORY_TRACT | 2 refills | Status: AC | PRN

## 2018-11-28 MED ORDER — PROCHLORPERAZINE MALEATE 10 MG PO TABS: 10.0000 mg | ORAL_TABLET | Freq: Four times a day (QID) | ORAL | 2 refills | Status: AC | PRN

## 2018-11-28 MED ORDER — ONDANSETRON HCL 4 MG/5ML PO SOLN: 4.0000 mg | Freq: Three times a day (TID) | ORAL | 1 refills | Status: AC | PRN

## 2018-11-28 MED ORDER — DICYCLOMINE HCL 10 MG PO CAPS: 10.0000 mg | ORAL_CAPSULE | Freq: Three times a day (TID) | ORAL | 1 refills | Status: AC

## 2018-11-28 MED ORDER — HEPARIN SOD (PORK) LOCK FLUSH 100 UNIT/ML IV SOLN
500.0000 [IU] | Freq: Once | INTRAVENOUS | Status: AC
  Administered 2018-11-28: 500 [IU]
  Filled 2018-11-28: qty 5

## 2018-11-28 MED ORDER — SODIUM CHLORIDE 0.9% FLUSH
10.0000 mL | Freq: Once | INTRAVENOUS | Status: AC
  Administered 2018-11-28: 10 mL
  Filled 2018-11-28: qty 10

## 2018-11-28 NOTE — Progress Notes (Signed)
Faxed order for Nebulizer to [facility].

## 2018-11-29 ENCOUNTER — Telehealth: Payer: Self-pay | Admitting: Hematology

## 2018-11-29 NOTE — Telephone Encounter (Signed)
Scheduled appts per 01/14 los. Sent staff message for MD visit to be added.

## 2018-12-01 ENCOUNTER — Telehealth: Payer: Self-pay

## 2018-12-01 NOTE — Telephone Encounter (Signed)
On 11/28/2018 faxed order for Nebulizer to [facility].

## 2018-12-05 ENCOUNTER — Other Ambulatory Visit: Payer: Self-pay | Admitting: Radiology

## 2018-12-05 ENCOUNTER — Ambulatory Visit: Payer: Self-pay | Admitting: Physician Assistant

## 2018-12-05 ENCOUNTER — Encounter: Payer: Self-pay | Admitting: Physician Assistant

## 2018-12-05 VITALS — BP 102/60 | HR 101 | Temp 98.9°F | Ht 72.0 in | Wt 266.0 lb

## 2018-12-05 DIAGNOSIS — I10 Essential (primary) hypertension: Secondary | ICD-10-CM

## 2018-12-05 DIAGNOSIS — E119 Type 2 diabetes mellitus without complications: Secondary | ICD-10-CM

## 2018-12-05 DIAGNOSIS — R1310 Dysphagia, unspecified: Secondary | ICD-10-CM

## 2018-12-05 DIAGNOSIS — E870 Hyperosmolality and hypernatremia: Secondary | ICD-10-CM

## 2018-12-05 DIAGNOSIS — F419 Anxiety disorder, unspecified: Secondary | ICD-10-CM

## 2018-12-05 LAB — BASIC METABOLIC PANEL
BUN: 19 mg/dL (ref 6–23)
CO2: 31 meq/L (ref 19–32)
Calcium: 8.4 mg/dL (ref 8.4–10.5)
Chloride: 94 mEq/L — ABNORMAL LOW (ref 96–112)
Creatinine, Ser: 0.85 mg/dL (ref 0.40–1.50)
GFR: 92.85 mL/min (ref >60.00)
Glucose, Bld: 145 mg/dL — ABNORMAL HIGH (ref 70–99)
Potassium: 3.7 mEq/L (ref 3.5–5.1)
Sodium: 134 mEq/L — ABNORMAL LOW (ref 135–145)

## 2018-12-05 MED ORDER — PAROXETINE HCL 40 MG PO TABS: ORAL_TABLET | ORAL | 1 refills | Status: AC

## 2018-12-05 NOTE — Patient Instructions (Signed)
It was great to see you!  Start 20 mg paxil today. After 1 month, increase to 40 mg.  We will be sending you to endocrinology.  Please confirm your metoprolol medication at home.  After speaking with Dr. Juleen China here, I think it would be best to have [name] weigh in on your swallowing and need for swallow study -- I will defer that decision to them. That way they can review your goals of care and make sure we are all on the same page with your treatment options.  Take care,  [name] [name]

## 2018-12-05 NOTE — Progress Notes (Signed)
Darrell Sutton is a 58 y.o. male is here to follow up on Anxiety, Hypertension and Diabetes.  I acted as a Education administrator for [employer], [name] Anselmo Pickler, LPN  History of Present Illness:   Chief Complaint  Patient presents with  . Anxiety  . Hypertension  . Diabetes    Hypertension  This is a chronic problem. Episode onset: Pt just started Metoprolol again this past weekend since able to swallow again. Pertinent negatives include no blurred vision. (Dizziness) Risk factors for coronary artery disease include male gender and diabetes mellitus. Compliance problems: Pt was off Metoprolol 2-3 months due to unable to swallow.  Hypertensive end-organ damage includes CAD/MI.  Diabetes  He presents for his follow-up diabetic visit. He has type 2 diabetes mellitus. His disease course has been stable. There are no hypoglycemic associated symptoms. Pertinent negatives for diabetes include no blurred vision, no foot paresthesias, no foot ulcerations and no visual change. Hypoglycemia complications include hospitalization. Risk factors for coronary artery disease include diabetes mellitus and male sex. Current diabetic treatment includes insulin injections. He is following a diabetic diet. Meal planning includes avoidance of concentrated sweets. He has not had a previous visit with a dietitian. He never participates in exercise. Home blood sugar record trend: Sugars have been running 200-300's. An ACE inhibitor/angiotensin II receptor blocker is not being taken. He does not see a podiatrist.Eye exam is not current.   He was on Paxil 40 mg for years but stopped once he wasn't able to swallow well. He would like to resume it. Denies SI/HI.  Since around Christmas, he has had some return of swallowing function. He has been drinking oral glucerna shakes and smoothies that wife has prepared for him. He was told that he is dehydrated so he has been putting more free water in his tube. He is only using his  J-tube for free water at this time. Wife and husband report frequent coughing spells while eating. He has not had a swallow study as far as they are aware.   Health Maintenance Due  Topic Date Due  . FOOT EXAM  10/01/1971    Past Medical History:  Diagnosis Date  . Anxiety 2009   started with divorce  . Cancer [facility])    cancer of lower third of esophagus  . Diabetes mellitus without complication ([place])   . Gastric ulcer    from NSAID overuse  . GERD (gastroesophageal reflux disease)   . Hyperlipidemia   . Hypertension   . MI (myocardial infarction) ([place]) 2011   has two stents  . Obesity    highest weight in 500's     Social History   Socioeconomic History  . Marital status: Married    Spouse name: Not on file  . Number of children: 3  . Years of education: Not on file  . Highest education level: Not on file  Occupational History  . Not on file  Social Needs  . Financial resource strain: Not on file  . Food insecurity:    Worry: Not on file    Inability: Not on file  . Transportation needs:    Medical: No    Non-medical: No  Tobacco Use  . Smoking status: Never Smoker  . Smokeless tobacco: Never Used  Substance and Sexual Activity  . Alcohol use: Never    Frequency: Never  . Drug use: Not Currently    Types: Marijuana    Comment: Youth years- not since earlt 90's  .  Sexual activity: Not on file  Lifestyle  . Physical activity:    Days per week: Not on file    Minutes per session: Not on file  . Stress: Not on file  Relationships  . Social connections:    Talks on phone: Not on file    Gets together: Not on file    Attends religious service: Not on file    Active member of club or organization: Not on file    Attends meetings of clubs or organizations: Not on file    Relationship status: Not on file  . Intimate partner violence:    Fear of current or ex partner: Not on file    Emotionally abused: Not on file    Physically abused: Not on file     Forced sexual activity: Not on file  Other Topics Concern  . Not on file  Social History Narrative   From [place], moved here Sep 2019 with his wife and son   Son with autism, starting at [employer]       Past Surgical History:  Procedure Laterality Date  . [place] SURGERY  2010  . core biopsy  10/11/2018   core biopsy of lymph nodes in Interventional Radiology  . GASTRIC BYPASS  11/24/2008  . GASTROJEJUNOSTOMY N/A 10/17/2018   Procedure: LAPAROSCOPIC PLACEMENT OF  LEFT FEEDING JEJUNOSTOMY TUBE;  Surgeon: Michael Boston, MD;  Location: [facility];  Service: General;  Laterality: N/A;  . IR ENDOLUMINAL BX OF BILIARY TREE  11/23/2018  . IR EXCHANGE BILIARY DRAIN  11/23/2018  . IR INT EXT BILIARY DRAIN WITH CHOLANGIOGRAM  11/09/2018  . JOINT REPLACEMENT     Bilateral knee  . PORTACATH PLACEMENT Right 10/17/2018   Procedure: INSERTION PORT-A-CATH WITH ULTRASOUND AND FLUORO;  Surgeon: Michael Boston, MD;  Location: [facility];  Service: General;  Laterality: Right;  . TONSILECTOMY/ADENOIDECTOMY WITH MYRINGOTOMY      Family History  Problem Relation Age of Onset  . Hyperlipidemia Mother   . Hypertension Mother   . Hyperlipidemia Father   . Hypertension Father   . Stroke Father   . Cancer Father        NHL  . Diabetes Brother   . Heart attack Brother 43  . Hyperlipidemia Brother   . Hypertension Brother   . Heart attack Maternal Grandmother   . Heart attack Maternal Grandfather   . Early death Maternal Grandfather   . Esophageal cancer Paternal Grandmother   . Colon cancer Paternal Grandfather   . Colon cancer Cousin   . Stomach cancer Neg Hx     PMHx, SurgHx, SocialHx, FamHx, Medications, and Allergies were reviewed in the Visit Navigator and updated as appropriate.   Patient Active Problem List   Diagnosis Date Noted  . Dehydration   . Hyperkalemia   . Hyperglycemic hyperosmolar nonketotic coma ([place]) 11/20/2018  . Hyperglycemia   . Choledocholithiasis 11/07/2018  . Abnormal  LFTs 11/07/2018  . Acute urinary retention 10/19/2018  . 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 Protein-calorie malnutrition, moderate ([place]) 10/17/2018  . Jejunostomy tube present 10/17/2018  . Port-A-Cath in place ([facility]) 10/17/2018  . Goals of care, counseling/discussion 10/17/2018  . Cancer of lower third of esophagus ([place]) 09/26/2018  . Coronary artery disease involving native coronary artery of native heart without angina pectoris 08/31/2018  . Dizziness 08/31/2018  . Type 2 diabetes mellitus with complication, without long-term current use of insulin ([place]) 08/31/2018  . Gastroesophageal reflux disease 08/24/2018  . Hiatal hernia  08/24/2018  . History of MI (myocardial infarction) 08/24/2018  . Mass of bladder 08/24/2018  . Obesity   . Hypertension   . Hyperlipidemia   . Diabetes mellitus without complication ([place])   . Anxiety 11/16/2007    Social History   Tobacco Use  . Smoking status: Never Smoker  . Smokeless tobacco: Never Used  Substance Use Topics  . Alcohol use: Never    Frequency: Never  . Drug use: Not Currently    Types: Marijuana    Comment: Youth years- not since earlt 90's    Current Medications and Allergies:    Current Outpatient Medications:  .  albuterol (PROVENTIL HFA;VENTOLIN HFA) 108 (90 Base) MCG/ACT inhaler, Inhale 2 puffs into the lungs every 6 (six) hours as needed for wheezing or shortness of breath., Disp: 1 Inhaler, Rfl: 2 .  bisacodyl (DULCOLAX) 10 MG suppository, Place 1 suppository (10 mg total) rectally as needed for moderate constipation., Disp: 12 suppository, Rfl: 0 .  cloNIDine (CATAPRES - DOSED IN MG/24 HR) 0.1 mg/24hr patch, Place 1 patch (0.1 mg total) onto the skin once a week., Disp: 4 patch, Rfl: 12 .  dicyclomine (BENTYL) 10 MG capsule, Take 1 capsule (10 mg total) by mouth 4 (four) times daily -  before meals and at bedtime., Disp: 30 capsule, Rfl: 1 .  insulin NPH Human (HUMULIN N,NOVOLIN N) 100 UNIT/ML  injection, Inject 0.18 mLs (18 Units total) into the skin 2 (two) times daily at 8 am and 10 pm. (Patient taking differently: Inject 25 Units into the skin 2 (two) times daily at 8 am and 10 pm. ), Disp: 10 mL, Rfl: 1 .  insulin regular (NOVOLIN R,HUMULIN R) 100 units/mL injection, For glucose 121 to 150 use 3 units, for 151 to 200 use 4 units, for 201-250 use 7 units, for 251-300 use 11 units, for 301 to 350 use 15 units. FOR 351-400 use 20 units (Patient taking differently: 2 (two) times daily before a meal. For glucose 121 to 150 use 3 units, for 151 to 200 use 4 units, for 201-250 use 7 units, for 251-300 use 11 units, for 301 to 350 use 15 units. FOR 351-400 use 20 units), Disp: 10 mL, Rfl: 0 .  metoprolol tartrate (LOPRESSOR) 25 MG tablet, Take 0.5 tablets (12.5 mg total) by mouth 2 (two) times daily., Disp: 30 tablet, Rfl: 0 .  Nutritional Supplements (FEEDING SUPPLEMENT, GLUCERNA 1.5 CAL,) LIQD, Place 1,000 mLs into feeding tube continuous. Start @ 52ml/hr, increase by 35ml every 8 hours to a goal of 63ml/hr, Disp: 1000 mL, Rfl: 30 .  ondansetron (ZOFRAN) 4 MG/5ML solution, Place 5 mLs (4 mg total) into feeding tube every 8 (eight) hours as needed for nausea or vomiting., Disp: 50 mL, Rfl: 1 .  polyethylene glycol (MIRALAX / GLYCOLAX) packet, 17 g by Per J Tube route daily as needed for mild constipation., Disp: 14 each, Rfl: 0 .  prochlorperazine (COMPAZINE) 10 MG tablet, Take 1 tablet (10 mg total) by mouth every 6 (six) hours as needed for nausea or vomiting., Disp: 30 tablet, Rfl: 2 .  scopolamine (TRANSDERM-SCOP) 1 MG/3DAYS, Place 1 patch (1.5 mg total) onto the skin every 3 (three) days., Disp: 10 patch, Rfl: 5 .  Syringe, Disposable, 3 ML MISC, Insulin syringes and needles., Disp: 100 each, Rfl: 0 .  Water For Irrigation, Sterile (FREE WATER) SOLN, Place 100 mLs into feeding tube every 4 (four) hours., Disp: , Rfl:  .  LORazepam (ATIVAN) 0.5 MG tablet,  Place 1 tablet (0.5 mg total) under the  tongue every 6 (six) hours as needed for anxiety. (Patient not taking: Reported on 12/05/2018), Disp: 40 tablet, Rfl: 2 .  PARoxetine (PAXIL) 40 MG tablet, Take 1/2 tablet daily po x 1 month. Then increase to 1 tablet daily., Disp: 90 tablet, Rfl: 1   Allergies  Allergen Reactions  . Penicillins Hives    Has patient had a PCN reaction causing immediate rash, facial/tongue/throat swelling, SOB or lightheadedness with hypotension: Yes Has patient had a PCN reaction causing severe rash involving mucus membranes or skin necrosis: No Has patient had a PCN reaction that required hospitalization: No; was already in hosp Has patient had a PCN reaction occurring within the last 10 years: No If all of the above answers are "NO", then may proceed with Cephalosporin use.     Review of Systems   Review of Systems  Eyes: Negative for blurred vision.  Negative unless otherwise specified per HPI.   Vitals:   Vitals:   12/05/18 0912  BP: 102/60  Pulse: (!) 101  Temp: 98.9 F (37.2 C)  TempSrc: Oral  SpO2: 97%  Weight: 266 lb (120.7 kg)  Height: 6' (1.829 m)     Body mass index is 36.08 kg/m.   Physical Exam:    Physical Exam Vitals signs and nursing note reviewed.  Constitutional:      General: He is not in acute distress.    Appearance: He is well-developed. He is not ill-appearing or toxic-appearing.  Cardiovascular:     Rate and Rhythm: Normal rate and regular rhythm.     Pulses: Normal pulses.     Heart sounds: Normal heart sounds, S1 normal and S2 normal.     Comments: No LE edema Pulmonary:     Effort: Pulmonary effort is normal.     Breath sounds: Normal breath sounds.  Skin:    General: Skin is warm and dry.  Neurological:     Mental Status: He is alert.     GCS: GCS eye subscore is 4. GCS verbal subscore is 5. GCS motor subscore is 6.  Psychiatric:        Speech: Speech normal.        Behavior: Behavior normal. Behavior is cooperative.      Assessment and Plan:     Darrell Sutton was seen today for anxiety, hypertension and diabetes.  Diagnoses and all orders for this visit:  Hypernatremia Re-check BMP today. May need to adjust free water intake in tube if needed.  -     Basic metabolic panel  Diabetes mellitus without complication [facility]) Referral to endocrinology. He is on insulin and overall blood sugars are coming down. I would like to maximize his care and refer to specialist at this time. Patient and wife are agreeable.  Essential hypertension After discussion, wife is unsure of dosage of blood pressure medication, specifically, she thinks that he may be taking 25 mg metropolol BID (instead of 12.5 mg metoprolol BID). She is going to go home and check on this and let us know. His blood pressure medications may need to be de-escalated.  Dysphagia, unspecified type Will defer to oncology for further work-up. May need outpatient MBSS with recommendations. High risk for aspiration PNA.  Anxiety Restart Paxil 20 mg daily. After one month, increase to 40 mg daily. If problems or concerns, patient advised to follow-up with [place]. I discussed with patient that if they develop any SI, to tell someone immediately and seek medical  attention.  Other orders -     PARoxetine (PAXIL) 40 MG tablet; Take 1/2 tablet daily po x 1 month. Then increase to 1 tablet daily.  . Reviewed expectations re: course of current medical issues. . Discussed self-management of symptoms. . Outlined signs and symptoms indicating need for more acute intervention. . Patient verbalized understanding and all questions were answered. . See orders for this visit as documented in the electronic medical record. . Patient received an After Visit Summary.  CMA or LPN served as scribe during this visit. History, Physical, and Plan performed by medical provider. The above documentation has been reviewed and is accurate and complete.  [name], [name] [place] [place] 12/05/2018  Follow-up: No follow-ups on file.

## 2018-12-06 ENCOUNTER — Other Ambulatory Visit (HOSPITAL_COMMUNITY): Payer: Self-pay | Admitting: Interventional Radiology

## 2018-12-06 ENCOUNTER — Other Ambulatory Visit: Payer: Self-pay | Admitting: Hematology

## 2018-12-06 ENCOUNTER — Other Ambulatory Visit: Payer: Self-pay

## 2018-12-06 ENCOUNTER — Encounter (HOSPITAL_COMMUNITY): Payer: Self-pay

## 2018-12-06 ENCOUNTER — Ambulatory Visit (HOSPITAL_COMMUNITY)
Admission: RE | Admit: 2018-12-06 | Discharge: 2018-12-06 | Disposition: A | Discharge status: Home / Self Care | Payer: Self-pay | Source: Ambulatory Visit | Attending: Interventional Radiology | Admitting: Interventional Radiology

## 2018-12-06 DIAGNOSIS — Z808 Family history of malignant neoplasm of other organs or systems: Secondary | ICD-10-CM | POA: Insufficient documentation

## 2018-12-06 DIAGNOSIS — Z8711 Personal history of peptic ulcer disease: Secondary | ICD-10-CM | POA: Insufficient documentation

## 2018-12-06 DIAGNOSIS — I252 Old myocardial infarction: Secondary | ICD-10-CM | POA: Insufficient documentation

## 2018-12-06 DIAGNOSIS — I10 Essential (primary) hypertension: Secondary | ICD-10-CM | POA: Insufficient documentation

## 2018-12-06 DIAGNOSIS — E669 Obesity, unspecified: Secondary | ICD-10-CM | POA: Insufficient documentation

## 2018-12-06 DIAGNOSIS — E119 Type 2 diabetes mellitus without complications: Secondary | ICD-10-CM | POA: Insufficient documentation

## 2018-12-06 DIAGNOSIS — Z4682 Encounter for fitting and adjustment of non-vascular catheter: Secondary | ICD-10-CM | POA: Insufficient documentation

## 2018-12-06 DIAGNOSIS — Z880 Allergy status to penicillin: Secondary | ICD-10-CM | POA: Insufficient documentation

## 2018-12-06 DIAGNOSIS — K831 Obstruction of bile duct: Principal | ICD-10-CM

## 2018-12-06 DIAGNOSIS — Z8249 Family history of ischemic heart disease and other diseases of the circulatory system: Secondary | ICD-10-CM | POA: Insufficient documentation

## 2018-12-06 DIAGNOSIS — Z807 Family history of other malignant neoplasms of lymphoid, hematopoietic and related tissues: Secondary | ICD-10-CM | POA: Insufficient documentation

## 2018-12-06 DIAGNOSIS — Z9884 Bariatric surgery status: Secondary | ICD-10-CM | POA: Insufficient documentation

## 2018-12-06 DIAGNOSIS — Z833 Family history of diabetes mellitus: Secondary | ICD-10-CM | POA: Insufficient documentation

## 2018-12-06 DIAGNOSIS — E785 Hyperlipidemia, unspecified: Secondary | ICD-10-CM | POA: Insufficient documentation

## 2018-12-06 DIAGNOSIS — Z8501 Personal history of malignant neoplasm of esophagus: Secondary | ICD-10-CM | POA: Insufficient documentation

## 2018-12-06 DIAGNOSIS — Z794 Long term (current) use of insulin: Secondary | ICD-10-CM | POA: Insufficient documentation

## 2018-12-06 DIAGNOSIS — C801 Malignant (primary) neoplasm, unspecified: Secondary | ICD-10-CM

## 2018-12-06 DIAGNOSIS — Z823 Family history of stroke: Secondary | ICD-10-CM | POA: Insufficient documentation

## 2018-12-06 DIAGNOSIS — Z79899 Other long term (current) drug therapy: Secondary | ICD-10-CM | POA: Insufficient documentation

## 2018-12-06 DIAGNOSIS — K219 Gastro-esophageal reflux disease without esophagitis: Secondary | ICD-10-CM | POA: Insufficient documentation

## 2018-12-06 HISTORY — PX: IR ENDOLUMINAL BX OF BILIARY TREE: IMG6053

## 2018-12-06 HISTORY — PX: IR BILIARY STENT(S) EXISTING ACCESS INC DILATION CATH EXCHANGE: IMG6048

## 2018-12-06 LAB — CBC WITH DIFFERENTIAL/PLATELET
Abs Immature Granulocytes: 0.05 10*3/uL (ref 0.00–0.07)
Basophils Absolute: 0 10*3/uL (ref 0.0–0.1)
Basophils Relative: 0 %
EOS PCT: 1 %
Eosinophils Absolute: 0 10*3/uL (ref 0.0–0.5)
HCT: 44.9 % (ref 39.0–52.0)
Hemoglobin: 13.6 g/dL (ref 13.0–17.0)
Immature Granulocytes: 1 %
Lymphocytes Relative: 1 %
Lymphs Abs: 0.1 10*3/uL — ABNORMAL LOW (ref 0.7–4.0)
MCH: 25.1 pg — ABNORMAL LOW (ref 26.0–34.0)
MCHC: 30.3 g/dL (ref 30.0–36.0)
MCV: 82.8 fL (ref 80.0–100.0)
Monocytes Absolute: 0.5 10*3/uL (ref 0.1–1.0)
Monocytes Relative: 6 %
NRBC: 0 % (ref 0.0–0.2)
Neutro Abs: 7.8 10*3/uL — ABNORMAL HIGH (ref 1.7–7.7)
Neutrophils Relative %: 91 %
Platelets: 173 10*3/uL (ref 150–400)
RBC: 5.42 MIL/uL (ref 4.22–5.81)
RDW: 20.4 % — ABNORMAL HIGH (ref 11.5–15.5)
WBC: 8.5 10*3/uL (ref 4.0–10.5)

## 2018-12-06 LAB — COMPREHENSIVE METABOLIC PANEL
ALBUMIN: 2.5 g/dL — AB (ref 3.5–5.0)
ALT: 25 U/L (ref 0–44)
AST: 32 U/L (ref 15–41)
Alkaline Phosphatase: 166 U/L — ABNORMAL HIGH (ref 38–126)
Anion gap: 12 (ref 5–15)
BILIRUBIN TOTAL: 1.4 mg/dL — AB (ref 0.3–1.2)
BUN: 14 mg/dL (ref 6–20)
CO2: 22 mmol/L (ref 22–32)
Calcium: 8.1 mg/dL — ABNORMAL LOW (ref 8.9–10.3)
Chloride: 98 mmol/L (ref 98–111)
Creatinine, Ser: 0.72 mg/dL (ref 0.61–1.24)
GFR calc Af Amer: >60 mL/min (ref >60)
GFR calc non Af Amer: >60 mL/min (ref >60)
Glucose, Bld: 102 mg/dL — ABNORMAL HIGH (ref 70–99)
Potassium: 3.8 mmol/L (ref 3.5–5.1)
SODIUM: 132 mmol/L — AB (ref 135–145)
Total Protein: 6.2 g/dL — ABNORMAL LOW (ref 6.5–8.1)

## 2018-12-06 LAB — PROTIME-INR
INR: 1.01
Prothrombin Time: 13.2 seconds (ref 11.4–15.2)

## 2018-12-06 LAB — GLUCOSE, CAPILLARY
GLUCOSE-CAPILLARY: 75 mg/dL (ref 70–99)
Glucose-Capillary: 116 mg/dL — ABNORMAL HIGH (ref 70–99)
Glucose-Capillary: 110 mg/dL — ABNORMAL HIGH (ref 70–99)
Glucose-Capillary: 79 mg/dL (ref 70–99)

## 2018-12-06 MED ORDER — DEXTROSE 50 % IV SOLN
12.5000 g | INTRAVENOUS | Status: DC | PRN
  Administered 2018-12-06: 12.5 g via INTRAVENOUS
  Filled 2018-12-06: qty 50

## 2018-12-06 MED ORDER — FENTANYL CITRATE (PF) 100 MCG/2ML IJ SOLN
INTRAMUSCULAR | Status: AC | PRN
  Administered 2018-12-06 (×2): 50 ug via INTRAVENOUS

## 2018-12-06 MED ORDER — HEPARIN SOD (PORK) LOCK FLUSH 100 UNIT/ML IV SOLN
500.0000 [IU] | Freq: Once | INTRAVENOUS | Status: AC
  Administered 2018-12-06: 500 [IU] via INTRAVENOUS
  Filled 2018-12-06: qty 5

## 2018-12-06 MED ORDER — DEXTROSE 50 % IV SOLN
INTRAVENOUS | Status: AC
  Administered 2018-12-06: 12.5 g via INTRAVENOUS
  Filled 2018-12-06: qty 50

## 2018-12-06 MED ORDER — GLUCAGON HCL RDNA (DIAGNOSTIC) 1 MG IJ SOLR
INTRAMUSCULAR | Status: AC
  Filled 2018-12-06: qty 1

## 2018-12-06 MED ORDER — ONDANSETRON HCL 4 MG/2ML IJ SOLN
4.0000 mg | Freq: Once | INTRAMUSCULAR | Status: AC
  Administered 2018-12-06: 4 mg via INTRAVENOUS

## 2018-12-06 MED ORDER — HYDROMORPHONE HCL 1 MG/ML IJ SOLN
1.0000 mg | INTRAMUSCULAR | Status: AC
  Administered 2018-12-06: 1 mg via INTRAVENOUS
  Filled 2018-12-06: qty 1

## 2018-12-06 MED ORDER — FENTANYL CITRATE (PF) 100 MCG/2ML IJ SOLN
INTRAMUSCULAR | Status: AC
  Filled 2018-12-06: qty 2

## 2018-12-06 MED ORDER — VANCOMYCIN HCL 10 G IV SOLR
1500.0000 mg | INTRAVENOUS | Status: AC
  Administered 2018-12-06: 1500 mg via INTRAVENOUS
  Filled 2018-12-06: qty 1500

## 2018-12-06 MED ORDER — IOPAMIDOL (ISOVUE-300) INJECTION 61%
50.0000 mL | Freq: Once | INTRAVENOUS | Status: AC | PRN
  Administered 2018-12-06: 25 mL

## 2018-12-06 MED ORDER — ONDANSETRON HCL 4 MG/2ML IJ SOLN
INTRAMUSCULAR | Status: AC
  Administered 2018-12-06: 4 mg via INTRAVENOUS
  Filled 2018-12-06: qty 2

## 2018-12-06 MED ORDER — HYDROCODONE-ACETAMINOPHEN 7.5-325 MG/15ML PO SOLN: 15.0000 mL | Freq: Four times a day (QID) | ORAL | 0 refills | Status: DC | PRN

## 2018-12-06 MED ORDER — SODIUM CHLORIDE 0.9 % IV SOLN
INTRAVENOUS | Status: DC
  Administered 2018-12-06: 12:00:00 via INTRAVENOUS

## 2018-12-06 MED ORDER — LIDOCAINE HCL 1 % IJ SOLN
INTRAMUSCULAR | Status: AC
  Filled 2018-12-06: qty 20

## 2018-12-06 MED ORDER — MIDAZOLAM HCL 2 MG/2ML IJ SOLN
INTRAMUSCULAR | Status: AC | PRN
  Administered 2018-12-06 (×2): 1 mg via INTRAVENOUS
  Administered 2018-12-06: 2 mg via INTRAVENOUS

## 2018-12-06 MED ORDER — OXYCODONE HCL 10 MG PO TABS: 10.0000 mg | ORAL_TABLET | Freq: Three times a day (TID) | ORAL | 0 refills | Status: AC | PRN

## 2018-12-06 MED ORDER — IOPAMIDOL (ISOVUE-300) INJECTION 61%
INTRAVENOUS | Status: AC
  Administered 2018-12-06: 25 mL
  Filled 2018-12-06: qty 50

## 2018-12-06 MED ORDER — MIDAZOLAM HCL 2 MG/2ML IJ SOLN
INTRAMUSCULAR | Status: AC
  Filled 2018-12-06: qty 4

## 2018-12-06 NOTE — Procedures (Signed)
Pre procedural Diagnosis: Biliary Obstruction Post procedural Diagnosis: Same  Technically successful repeat biliary brush biopsy fluoroscopic guided internal biliary stent placement  Successful fluroscopic exchange of a right sided approach transhepatic8 Fr biliary drainage catheter with end coiled and locked within the duodenum.  The biliary drain was capped  EBL: None  No immediate post procedural complications.  Ronny Bacon, MD Pager #: [phone]

## 2018-12-06 NOTE — Discharge Instructions (Signed)
Biliary Drainage Catheter Home Guide A biliary drainage catheter is a thin, flexible tube that is inserted through your skin into the bile ducts in your liver. Bile is a thick yellow or green fluid that helps digest fat in foods. The purpose of a biliary drainage catheter is to keep bile from backing up into your liver. Backup of bile can occur when there is a blockage that prevents bile from moving from the bile ducts into the small intestine as it should. The blockage can be caused by gallstones, a tumor, or scar tissue. There are three types of biliary drainage:  External biliary drainage. With this type, bile is only drained into a collection bag outside your body (external collection bag).  Internal-external biliary drainage. With this type, bile is drained to an external collection bag as well as into your small intestine.  Internal biliary drainage. With this type, bile is only drained into your small intestine. General home care includes these daily actions:   Inspection of your drainage catheter.  Flushing your drainage catheter with saline.  Emptying drainage from the collection bag (if present).  Recording the amount of drainage.  Checking the catheter insertion site for signs of infection. Check for: ? Redness, swelling, or pain. ? Fluid or blood. ? Warmth. ? Pus or a bad smell. How do I inspect my drainage catheter?  Check the dressing to make sure that it is dry and clean.  Look at the skin around the drainage catheter when changing the dressing for any problems such as redness, rash, or skin breakdown.  Check the drainage bag to make sure that drainage fluid is flowing into the bag well. Note the color and amount compared to other days.  Check the drainage catheter and bag for any cracks or kinks in the tubing. How do I change my dressing? The dressing over the drainage catheter should be changed every other day, or more often if needed to keep the dressing dry. Your  health care provider will instruct you about how often to change your dressing. Supplies needed:  Mild soap and warm water.  Split gauze pads, 4 x 4 inches (10 x 10 cm) to use as a dressing sponge.  Gauze pads, 4 x 4 inches (10 x 10 cm) or adhesive dressing cover.  Paper tape. How to change the dressing: 1. Wash your hands with soap and water. 2. Gently remove the old dressing. Avoid using scissors to remove the dressing because they may damage the drainage catheter. 3. Wash the skin around the insertion site with mild soap and warm water, rinse well, then pat the area dry with a clean cloth. 4. Check the skin around the drainage catheter for redness or swelling, or for yellow or green discharge that has a bad smell. 5. If the drainage catheter was stitched (sutured) to the skin, inspect the suture to make sure it is still anchored in the skin. 6. Do not apply creams, ointments, or alcohol to the site. Allow the skin to air-dry completely before you apply a new dressing. 7. Place the drainage catheter through the slit in a dressing sponge. The dressing sponge should slide under the disk that holds the drainage catheter in place. 8. Cover the drainage catheter and the dressing sponge with a 4 x 4 inch (10 x 10 cm) gauze. The drainage catheter should rest on the gauze and not on the skin. 9. Tape the dressing to the skin. 10. You may be instructed to use an  adhesive dressing covering over the top of this in place of the gauze and tape. °11. Wash your hands with soap and water. °How do I flush my drainage catheter? °Biliary drainagecatheters should be flushed daily, or as often as told by your health care provider. The end of the drainage catheter is closed using an IV cap. A syringe can be directly connected to the IV cap. °Supplies needed: °· Alcohol swab. °· 10 mL prefilled normal saline syringe. °How to flush the drainage catheter: °1. Wash your hands with soap and water. °2. If your drainage  catheter has a stopcock attached to it, turn the stopcock toward the drainage bag. This will allow the saline to flow in the direction of your body. °3. Clean the IV cap with an alcohol swab. °4. Screw the tip of a 10 mL normal saline syringe onto the IV cap. °5. Inject the saline over 5-10 seconds. If you feel resistance while injecting, stop immediately. Avoid  pulling back on the plunger. Doing that could increase your risk of infection. °6. Remove the syringe from the cap. Turn the stopcock so that fluid flows from your body into the drainage bag. You may notice more fluid flowing into the bag after you have completed the flush. °How do I attach a bag to my drainage catheter? °If you are having trouble with your internal biliary drain, you may be directed by your health care provider to use bag drainage until you can be seen to fix the problem. For this reason, you should always have a collection bag and connecting tubing at home. If you do not have these supplies, remember to ask for them at your next appointment. °1. Remove the bag and the connecting tubing from their packaging. °2. Connect the funnel end of the tubing to the bag's cone-shaped stem. °3. Remove the IV cap from the biliary drain. To do this, unscrew it and replace it with the screw-on end of the tubing. °4. Save the IV cap in a plastic storage bag that can be sealed. °How do I empty my collection bag? °Empty the collection bag whenever it becomes 2/3 full. Also empty it before you go to sleep. Most collection bags have a drainage valve at the bottom so the bag can be that allows them to be emptied easily. °1. Wash your hands with soap and water. °2. Hold the collection bag over the toilet, basin, or collection container. Use a measuring container if your health care provider told you to measure the drainage. °3. Unscrew the valve to open it, and allow the bag to drain. °4. Close the valve securely to avoid leakage. °5. Use a tissue or disposable  napkin to wipe the valve clean. °6. Wash the measuring container with soap and water. °7. Record the amount of drainage as told by your health care provider. °Contact a health care provider if: °· Your pain gets worse after it had improved, and it is not relieved with pain medicines. °· You have any questions about caring for your drainage catheter or collection bag. °· You have any of these around your catheter insertion site or coming from it: °? Skin breakdown. °? Redness, swelling, or pain. °? Fluid or blood. °? Warmth to the touch. °? Pus or a bad smell. °Get help right away if: °· You have a fever or chills. °· Your redness, swelling, or pain at the catheter insertion site gets worse, even though you are cleaning it well. °· You have leakage of   bile around the drainage catheter.  Your drainage catheter becomes blocked or clogged.  Your drainage catheter comes out. This information is not intended to replace advice given to you by your health care provider. Make sure you discuss any questions you have with your health care provider. Document Released: 08/22/2013 Document Revised: 09/20/2016 Document Reviewed: 09/20/2016 Elsevier Interactive Patient Education  2019 [place].    Moderate Conscious Sedation, Adult, Care After These instructions provide you with information about caring for yourself after your procedure. Your health care provider may also give you more specific instructions. Your treatment has been planned according to current medical practices, but problems sometimes occur. Call your health care provider if you have any problems or questions after your procedure. What can I expect after the procedure? After your procedure, it is common:  To feel sleepy for several hours.  To feel clumsy and have poor balance for several hours.  To have poor judgment for several hours.  To vomit if you eat too soon. Follow these instructions at home: For at least 24 hours after the  procedure:   Do not: ? Participate in activities where you could fall or become injured. ? Drive. ? Use heavy machinery. ? Drink alcohol. ? Take sleeping pills or medicines that cause drowsiness. ? Make important decisions or sign legal documents. ? Take care of children on your own.  Rest. Eating and drinking  Follow the diet recommended by your health care provider.  If you vomit: ? Drink water, juice, or soup when you can drink without vomiting. ? Make sure you have little or no nausea before eating solid foods. General instructions  Have a responsible adult stay with you until you are awake and alert.  Take over-the-counter and prescription medicines only as told by your health care provider.  If you smoke, do not smoke without supervision.  Keep all follow-up visits as told by your health care provider. This is important. Contact a health care provider if:  You keep feeling nauseous or you keep vomiting.  You feel light-headed.  You develop a rash.  You have a fever. Get help right away if:  You have trouble breathing. This information is not intended to replace advice given to you by your health care provider. Make sure you discuss any questions you have with your health care provider. Document Released: 08/22/2013 Document Revised: 04/05/2016 Document Reviewed: 02/21/2016 Elsevier Interactive Patient Education  2019 [place].   Moderate sedation Moderate Conscious Sedation, Adult, Care After These instructions provide you with information about caring for yourself after your procedure. Your health care provider may also give you more specific instructions. Your treatment has been planned according to current medical practices, but problems sometimes occur. Call your health care provider if you have any problems or questions after your procedure. What can I expect after the procedure? After your procedure, it is common:  To feel sleepy for several  hours.  To feel clumsy and have poor balance for several hours.  To have poor judgment for several hours.  To vomit if you eat too soon. Follow these instructions at home: For at least 24 hours after the procedure:   Do not: ? Participate in activities where you could fall or become injured. ? Drive. ? Use heavy machinery. ? Drink alcohol. ? Take sleeping pills or medicines that cause drowsiness. ? Make important decisions or sign legal documents. ? Take care of children on your own.  Rest. Eating and drinking  Follow  the diet recommended by your health care provider.  If you vomit: ? Drink water, juice, or soup when you can drink without vomiting. ? Make sure you have little or no nausea before eating solid foods. General instructions  Have a responsible adult stay with you until you are awake and alert.  Take over-the-counter and prescription medicines only as told by your health care provider.  If you smoke, do not smoke without supervision.  Keep all follow-up visits as told by your health care provider. This is important. Contact a health care provider if:  You keep feeling nauseous or you keep vomiting.  You feel light-headed.  You develop a rash.  You have a fever. Get help right away if:  You have trouble breathing. This information is not intended to replace advice given to you by your health care provider. Make sure you discuss any questions you have with your health care provider. Document Released: 08/22/2013 Document Revised: 04/05/2016 Document Reviewed: 02/21/2016 Elsevier Interactive Patient Education  2019 [employer].

## 2018-12-06 NOTE — Progress Notes (Signed)
Patient ID: Darrell Sutton, male   DOB: 03/23/1961, 59 y.o.   MRN: [id number]        Chief Complaint: Malignant biliary obstruction.  Referring Physician(s): Burr Medico  Patient Status: [facility] - Out-pt  History of Present Illness: Darrell Sutton is a 58 y.o. male with past medical history significant for CAD (post myocardial infarction and coronary stent placement), hypertension, hyperlipidemia, diabetes, obesity (post gastric bypass surgery) and esophageal cancer who was found to have presumed malignant biliary obstruction post percutaneous biliary drainage catheter placement on 11/09/2018.  Patient subsequent undergoing diagnostic angiogram and biliary brush biopsy on 11/23/2018 with inconclusive results.  Patient returns today for repeat diagnostic cholangiogram, biliary brush biopsy and attempted placement of a biliary stent.  The patient is accompanied by his wife though serves as his own historian.  Overall the patient states he is feeling improved, currently able to take a small amount of liquid by mouth.  He denies fever or chills.  Overall he is been tolerating the bili drainage catheter reasonably well.  Past Medical History:  Diagnosis Date  . Anxiety 2009   started with divorce  . Cancer [facility])    cancer of lower third of esophagus  . Diabetes mellitus without complication ([place])   . Gastric ulcer    from NSAID overuse  . GERD (gastroesophageal reflux disease)   . Hyperlipidemia   . Hypertension   . MI (myocardial infarction) ([place]) 2011   has two stents  . Obesity    highest weight in 500's    Past Surgical History:  Procedure Laterality Date  . [place] SURGERY  2010  . core biopsy  10/11/2018   core biopsy of lymph nodes in Interventional Radiology  . GASTRIC BYPASS  11/24/2008  . GASTROJEJUNOSTOMY N/A 10/17/2018   Procedure: LAPAROSCOPIC PLACEMENT OF  LEFT FEEDING JEJUNOSTOMY TUBE;  Surgeon: Michael Boston, MD;  Location: [facility] ORS;  Service: General;  Laterality: N/A;    . IR ENDOLUMINAL BX OF BILIARY TREE  11/23/2018  . IR EXCHANGE BILIARY DRAIN  11/23/2018  . IR INT EXT BILIARY DRAIN WITH CHOLANGIOGRAM  11/09/2018  . JOINT REPLACEMENT     Bilateral knee  . PORTACATH PLACEMENT Right 10/17/2018   Procedure: INSERTION PORT-A-CATH WITH ULTRASOUND AND FLUORO;  Surgeon: Michael Boston, MD;  Location: [facility] ORS;  Service: General;  Laterality: Right;  . TONSILECTOMY/ADENOIDECTOMY WITH MYRINGOTOMY      Allergies: Penicillins  Medications: Prior to Admission medications   Medication Sig Start Date End Date Taking? Authorizing Provider  albuterol (PROVENTIL HFA;VENTOLIN HFA) 108 (90 Base) MCG/ACT inhaler Inhale 2 puffs into the lungs every 6 (six) hours as needed for wheezing or shortness of breath. 11/28/18  Yes Truitt Merle, MD  cloNIDine (CATAPRES - DOSED IN MG/24 HR) 0.1 mg/24hr patch Place 1 patch (0.1 mg total) onto the skin once a week. 10/31/18  Yes Tanner, Lyndon Code., [name]  dicyclomine (BENTYL) 10 MG capsule Take 1 capsule (10 mg total) by mouth 4 (four) times daily -  before meals and at bedtime. 11/28/18  Yes Truitt Merle, MD  insulin NPH Human (HUMULIN N,NOVOLIN N) 100 UNIT/ML injection Inject 0.18 mLs (18 Units total) into the skin 2 (two) times daily at 8 am and 10 pm. Patient taking differently: Inject 25 Units into the skin 2 (two) times daily at 8 am and 10 pm.  11/22/18  Yes Eugenie Filler, MD  insulin regular (NOVOLIN R,HUMULIN R) 100 units/mL injection For glucose 121 to 150 use 3 units, for  151 to 200 use 4 units, for 201-250 use 7 units, for 251-300 use 11 units, for 301 to 350 use 15 units. FOR 351-400 use 20 units Patient taking differently: 2 (two) times daily before a meal. For glucose 121 to 150 use 3 units, for 151 to 200 use 4 units, for 201-250 use 7 units, for 251-300 use 11 units, for 301 to 350 use 15 units. FOR 351-400 use 20 units 11/22/18  Yes Eugenie Filler, MD  metoprolol tartrate (LOPRESSOR) 25 MG tablet Take 0.5 tablets (12.5 mg total) by  mouth 2 (two) times daily. 11/22/18  Yes Eugenie Filler, MD  Nutritional Supplements (FEEDING SUPPLEMENT, GLUCERNA 1.5 CAL,) LIQD Place 1,000 mLs into feeding tube continuous. Start @ 23ml/hr, increase by 44ml every 8 hours to a goal of 15ml/hr 11/11/18  Yes Aline August, MD  ondansetron (ZOFRAN) 4 MG/5ML solution Place 5 mLs (4 mg total) into feeding tube every 8 (eight) hours as needed for nausea or vomiting. 11/28/18  Yes Truitt Merle, MD  PARoxetine (PAXIL) 40 MG tablet Take 1/2 tablet daily po x 1 month. Then increase to 1 tablet daily. 12/05/18  Yes [name], PA  scopolamine (TRANSDERM-SCOP) 1 MG/3DAYS Place 1 patch (1.5 mg total) onto the skin every 3 (three) days. 10/31/18  Yes Tanner, Lyndon Code., [name]  bisacodyl (DULCOLAX) 10 MG suppository Place 1 suppository (10 mg total) rectally as needed for moderate constipation. 11/11/18   Aline August, MD  LORazepam (ATIVAN) 0.5 MG tablet Place 1 tablet (0.5 mg total) under the tongue every 6 (six) hours as needed for anxiety. Patient not taking: Reported on 12/05/2018 10/31/18   [name] E., [name]  polyethylene glycol [facility] / GLYCOLAX) packet 17 g by Per J Tube route daily as needed for mild constipation. 11/11/18   Aline August, MD  prochlorperazine (COMPAZINE) 10 MG tablet Take 1 tablet (10 mg total) by mouth every 6 (six) hours as needed for nausea or vomiting. 11/28/18   Truitt Merle, MD  Syringe, Disposable, 3 ML MISC Insulin syringes and needles. 10/18/18   Arrien, Jimmy Picket, MD  Water For Irrigation, Sterile (FREE WATER) SOLN Place 100 mLs into feeding tube every 4 (four) hours. 11/22/18   Eugenie Filler, MD     Family History  Problem Relation Age of Onset  . Hyperlipidemia Mother   . Hypertension Mother   . Hyperlipidemia Father   . Hypertension Father   . Stroke Father   . Cancer Father        NHL  . Diabetes Brother   . Heart attack Brother 1  . Hyperlipidemia Brother   . Hypertension Brother   . Heart attack  Maternal Grandmother   . Heart attack Maternal Grandfather   . Early death Maternal Grandfather   . Esophageal cancer Paternal Grandmother   . Colon cancer Paternal Grandfather   . Colon cancer Cousin   . Stomach cancer Neg Hx     Social History   Socioeconomic History  . Marital status: Married    Spouse name: Not on file  . Number of children: 3  . Years of education: Not on file  . Highest education level: Not on file  Occupational History  . Not on file  Social Needs  . Financial resource strain: Not on file  . Food insecurity:    Worry: Not on file    Inability: Not on file  . Transportation needs:    Medical: No    Non-medical: No  Tobacco Use  . Smoking status: Never Smoker  . Smokeless tobacco: Never Used  Substance and Sexual Activity  . Alcohol use: Never    Frequency: Never  . Drug use: Not Currently    Types: Marijuana    Comment: Youth years- not since earlt 90's  . Sexual activity: Not on file  Lifestyle  . Physical activity:    Days per week: Not on file    Minutes per session: Not on file  . Stress: Not on file  Relationships  . Social connections:    Talks on phone: Not on file    Gets together: Not on file    Attends religious service: Not on file    Active member of club or organization: Not on file    Attends meetings of clubs or organizations: Not on file    Relationship status: Not on file  Other Topics Concern  . Not on file  Social History Narrative   From [place], moved here Sep 2019 with his wife and son   Son with autism, starting at [employer] Status: 2 - Symptomatic, <50% confined to bed  Review of Systems: A 12 point ROS discussed and pertinent positives are indicated in the HPI above.  All other systems are negative.  Review of Systems  Vital Signs: BP (!) 142/90   Pulse 84   Resp 13   SpO2 99%   Physical Exam  Imaging: Dg Chest 2 View  Result Date: 11/19/2018 CLINICAL DATA:  Stage IV esophageal  cancer with spread to the lymph nodes. Elevated blood gas readings. EXAM: CHEST - 2 VIEW COMPARISON:  None. FINDINGS: The heart size and mediastinal contours are within normal limits. Both lungs are clear. Port catheter tip terminates in distal SVC-RA juncture. The visualized skeletal structures are unremarkable. IMPRESSION: No active cardiopulmonary disease. Electronically Signed   By: Ashley Royalty M.D.   On: 11/19/2018 22:38   US Abdomen Complete  Result Date: 11/07/2018 CLINICAL DATA:  Jaundice, rule out biliary obstruction EXAM: ABDOMEN ULTRASOUND COMPLETE COMPARISON:  CT 08/22/2018 FINDINGS: Gallbladder: Gallbladder is distended to 6 cm in diameter. There is sludge within the gallbladder. No echogenic gallstones. Negative sonographic Murphy's sign. No pericholecystic fluid. Significant gallbladder wall thickening. Common bile duct: Diameter: Dilated 9 mm. Liver: No focal lesion identified. Within normal limits in parenchymal echogenicity. Portal vein is patent on color Doppler imaging with normal direction of blood flow towards the liver. IVC: No abnormality visualized. Pancreas: Visualized portion unremarkable. Spleen: Size and appearance within normal limits. Right Kidney: Length: 12.6 cm. Cystic lesion with thin septation is coming anechoic measures 2.3 cm. This corresponds to low-density cyst on comparison CT. Left Kidney: Length: 14.3 cm.  There is Abdominal aorta: No aneurysm visualized. Other findings: None. IMPRESSION: 1. Dilatation of the common bile duct and gallbladder without obstructing lesion identified. No gallstones are evident but there is sludge within the lumen gallbladder. Consider contrast MRCP to further evaluate etiology of potential distal biliary obstruction. 2. No intrahepatic biliary duct dilatation identified. Electronically Signed   By: Suzy Bouchard M.D.   On: 11/07/2018 14:52   Mr 3d Recon At Scanner  Result Date: 11/07/2018 CLINICAL DATA:  Jaundice. Elevated liver  function tests. History of esophageal cancer. EXAM: MRI ABDOMEN WITHOUT AND WITH CONTRAST (INCLUDING MRCP) TECHNIQUE: Multiplanar multisequence MR imaging of the abdomen was performed both before and after the administration of intravenous contrast. Heavily T2-weighted images of the biliary and pancreatic ducts  were obtained, and three-dimensional MRCP images were rendered by post processing. CONTRAST:  10 cc Gadavist COMPARISON:  Multiple exams, including ultrasound 11/07/2018 and PET-CT from 09/29/2018 FINDINGS: Lower chest: Distal esophageal wall thickening with surrounding edema noted in this patient with history of esophageal mass. There is scarring or atelectasis in the right lower lobe. Hepatobiliary: Sludge in the gallbladder. Mild intrahepatic biliary dilatation with abrupt tapered/beaked appearance of the common bile duct approximately corresponding to its course along the pancreatic head. No well-defined filling defect. The common hepatic duct measures about 8 mm in diameter proximal to this area of narrowing. In the area of narrowing there is subtly accentuated enhancement in the expected vicinity of the dorsal pancreatic duct. The appearance is not classic for pancreatic malignancy, there is no associated hypoenhancement in this region. Pancreas: No discrete pancreatic mass is identified. As noted above, the narrowed segment of the CBD in the vicinity of the pancreatic head may potentially have faintly associated accentuated enhancement. Spleen:  Unremarkable Adrenals/Urinary Tract: Right mid kidney Bosniak category 2 cyst anteriorly. Otherwise unremarkable. Adrenal glands normal. Stomach/Bowel: There is wall thickening in the gastric cardia with surrounding stranding and indistinctness of tissue planes. Jejunostomy tube noted. Vascular/Lymphatic: A porta hepatis node measures 1.6 cm in short axis on image 48/1102. Another porta hepatis node measures 1.2 cm in short axis on image 45/1102. Smaller  periaortic lymph nodes are present and there are small lymph nodes around the distal esophagus and gastric cardia. Other:  No supplemental non-categorized findings. Musculoskeletal: Unremarkable IMPRESSION: 1. Marked narrowing in a 2.3 cm segment of the common bile duct corresponding with the segment of the duct along the pancreas. I do not see a definite pancreatic lesion but there is some faintly accentuated enhancement in the CBD in this vicinity. This could be from localized inflammation/cholangitis or a stricture. The CBD proximal to this stricture is 0.8 cm in diameter. Sludge noted in the gallbladder without definite stones. 2. Distal esophageal wall thickening and thickening in the proximal stomach. There are also postoperative findings in the stomach. Surrounding inflammatory stranding is noted around the distal esophagus and proximal stomach, possibly therapy related. Surrounding small lymph nodes noted along with enlarged lymph nodes in the porta hepatis which are nonspecific and could be reactive or malignant. 3. Scarring or atelectasis in the right lower lobe. Electronically Signed   By: Van Clines M.D.   On: 11/07/2018 19:06   Mr Abdomen Mrcp Moise Boring Contast  Result Date: 11/07/2018 CLINICAL DATA:  Jaundice. Elevated liver function tests. History of esophageal cancer. EXAM: MRI ABDOMEN WITHOUT AND WITH CONTRAST (INCLUDING MRCP) TECHNIQUE: Multiplanar multisequence MR imaging of the abdomen was performed both before and after the administration of intravenous contrast. Heavily T2-weighted images of the biliary and pancreatic ducts were obtained, and three-dimensional MRCP images were rendered by post processing. CONTRAST:  10 cc Gadavist COMPARISON:  Multiple exams, including ultrasound 11/07/2018 and PET-CT from 09/29/2018 FINDINGS: Lower chest: Distal esophageal wall thickening with surrounding edema noted in this patient with history of esophageal mass. There is scarring or atelectasis in  the right lower lobe. Hepatobiliary: Sludge in the gallbladder. Mild intrahepatic biliary dilatation with abrupt tapered/beaked appearance of the common bile duct approximately corresponding to its course along the pancreatic head. No well-defined filling defect. The common hepatic duct measures about 8 mm in diameter proximal to this area of narrowing. In the area of narrowing there is subtly accentuated enhancement in the expected vicinity of the dorsal pancreatic duct. The appearance  is not classic for pancreatic malignancy, there is no associated hypoenhancement in this region. Pancreas: No discrete pancreatic mass is identified. As noted above, the narrowed segment of the CBD in the vicinity of the pancreatic head may potentially have faintly associated accentuated enhancement. Spleen:  Unremarkable Adrenals/Urinary Tract: Right mid kidney Bosniak category 2 cyst anteriorly. Otherwise unremarkable. Adrenal glands normal. Stomach/Bowel: There is wall thickening in the gastric cardia with surrounding stranding and indistinctness of tissue planes. Jejunostomy tube noted. Vascular/Lymphatic: A porta hepatis node measures 1.6 cm in short axis on image 48/1102. Another porta hepatis node measures 1.2 cm in short axis on image 45/1102. Smaller periaortic lymph nodes are present and there are small lymph nodes around the distal esophagus and gastric cardia. Other:  No supplemental non-categorized findings. Musculoskeletal: Unremarkable IMPRESSION: 1. Marked narrowing in a 2.3 cm segment of the common bile duct corresponding with the segment of the duct along the pancreas. I do not see a definite pancreatic lesion but there is some faintly accentuated enhancement in the CBD in this vicinity. This could be from localized inflammation/cholangitis or a stricture. The CBD proximal to this stricture is 0.8 cm in diameter. Sludge noted in the gallbladder without definite stones. 2. Distal esophageal wall thickening and  thickening in the proximal stomach. There are also postoperative findings in the stomach. Surrounding inflammatory stranding is noted around the distal esophagus and proximal stomach, possibly therapy related. Surrounding small lymph nodes noted along with enlarged lymph nodes in the porta hepatis which are nonspecific and could be reactive or malignant. 3. Scarring or atelectasis in the right lower lobe. Electronically Signed   By: Van Clines M.D.   On: 11/07/2018 19:06   Ir Int Lianne Cure Biliary Drain With Cholangiogram  Result Date: 11/09/2018 INDICATION: History of metastatic esophageal cancer, now with malignant biliary obstruction with associated jaundice and elevated bilirubin levels, presumably secondary to porta hepatis adenopathy. Patient with history of prior gastric bypass surgery and as such is not a candidate for attempted ERCP. Request made for placement of a percutaneous biliary drainage catheter for malignant biliary obstruction. EXAM: ULTRASOUND AND FLUOROSCOPIC GUIDED PERCUTANEOUS TRANSHEPATIC CHOLANGIOGRAM AND BILIARY TUBE PLACEMENT COMPARISON:  MRCP - 11/07/2018 MEDICATIONS: Vancomycin 1 gm IV; the antibiotic was administered within an appropriate time frame prior to the initiation of the procedure. CONTRAST:  20 cc Isovue-300, administered into biliary tree ANESTHESIA/SEDATION: Moderate (conscious) sedation was employed during this procedure. A total of Versed 4 mg, Dilaudid 3 mg IV and Fentanyl 50 mcg was administered intravenously. Moderate Sedation Time: 36 minutes. The patient's level of consciousness and vital signs were monitored continuously by radiology nursing throughout the procedure under my direct supervision. FLUOROSCOPY TIME:  6 minutes (182 mGy) COMPLICATIONS: None immediate. TECHNIQUE: Informed written consent was obtained from the patient and the patient's wife after a discussion of the risks, benefits and alternatives to treatment. Questions regarding the procedure were  encouraged and answered. A timeout was performed prior to the initiation of the procedure. The right upper abdominal quadrant was prepped and draped in the usual sterile fashion, and a sterile drape was applied covering the operative field. Maximum barrier sterile technique with sterile gowns and gloves were used for the procedure. A timeout was performed prior to the initiation of the procedure. Ultrasound scanning was performed of the midline of the upper abdomen demonstrated very minimal left-sided intrahepatic biliary ductal dilatation. After lying soft tissues were anesthetized with 1% lidocaine with epinephrine, two attempts were made access the left biliary tree  under direct ultrasound fluoroscopic guided however this ultimately proved unsuccessful secondary to lack of significant left-sided intrahepatic biliary duct dilatation. As such, a spot along the right mid axillary line was marked fluoroscopically inferior to the right costophrenic angle. A peripheral very mildly dilated duct within the anterior aspect of the right lobe of the liver was targeted under direct ultrasound guidance after the overlying soft tissues were anesthetized with 1% lidocaine with epinephrine. Contrast injection demonstrates intra biliary puncture. The access bile duct was cannulated with a Nitrex wire which was advanced to the central aspect of the biliary tree. Tract was dilated with the inner 3 French catheter of the [place] set. Contrast injection confirmed appropriate peripheral access. Next, a Nitrex wire was advanced to the level of the duodenum. The track was dilated with the remainder of the [place] set. A 4 French angled glide catheter was advanced through the outer sheath the [place]ust[place] set to the level of the duodenum. Contrast injection confirmed appropriate positioning Next, over a Amplatz wire, the tract was dilated and a 10.2 [place] biliary drainage catheter was advanced with coil ultimately locked within the  duodenum. Contrast was injected and a completion radiograph was obtained. The catheter was connected to a drainage bag which yielded the brisk return of dark black colored bile. The catheter was secured to the skin with an interrupted suture. The patient tolerated the procedure well without immediate postprocedural complication. FINDINGS: Sonographic evaluation demonstrates very mild dilatation of the left intrahepatic biliary tree. Attempted though ultimately unsuccessful attempted left-sided biliary drainage catheter placement secondary lack of significant left-sided intrahepatic biliary ductal dilatation. Sonographic evaluation demonstrates mild dilatation of the right intrahepatic biliary tree which was ultimately successfully opacified and cannulated, allowing placement of a 10 French percutaneous biliary drainage catheter via the peripheral aspect of a very mildly dilated bile duct within the anterior segment of the right lobe of the liver with end coiled and locked within the duodenum. The radiopaque marker is located within the central aspect of the right anterior biliary tree. IMPRESSION: Successful placement of a 10.2 French percutaneous biliary drainage catheter with end coiled and locked within the duodenum. PLAN: - Biliary brush biopsy and formal cholangiogram could be performed following normalization of the patient's LFTs as clinically indicated. - Otherwise, fluoroscopic guided biliary stent placement could be performed in 4-6 weeks as indicated. Electronically Signed   By: Sandi Mariscal M.D.   On: 11/09/2018 17:18   Ir Exchange Biliary Drain  Result Date: 11/23/2018 INDICATION: History of metastatic esophageal cancer, and previous gastric bypass surgery, now with malignant biliary obstruction presumably secondary to malignant adenopathy at the level the biliary hilum, post image guided biliary drainage catheter placement on 11/09/2013. Patient presents today for fluoroscopic guided cholangiogram,  biliary brush biopsy and biliary drainage catheter exchange. EXAM: 1. DIAGNOSTIC SHEATH CHOLANGIOGRAM 2. FLUOROSCOPIC GUIDED BILIARY BRUSH BIOPSY 3. FLUOROSCOPIC GUIDED BILIARY DRAINAGE CATHETER EXCHANGE COMPARISON:  COMPARISON Image guided biliary drainage catheter placement-11/09/2018; MRCP-11/07/2018 CONTRAST:  45 mL Omnipaque-300 administered into the biliary system FLUOROSCOPY TIME:  3 minutes, 30 seconds (761.6 mGy) COMPLICATIONS: None immediate. TECHNIQUE: Informed written consent was obtained from the patient after a discussion of the risks, benefits and alternatives to treatment. Questions regarding the procedure were encouraged and answered. A timeout was performed prior to the initiation of the procedure. The external portion of the existing right-sided percutaneous biliary drainage catheter as well as the surrounding skin were prepped and draped in the usual sterile fashion. A sterile drape was applied covering the  operative field. Maximum barrier sterile technique with sterile gowns and gloves were used for the procedure. A timeout was performed prior to the initiation of the procedure. A pre procedural spot fluoroscopic image was obtained after contrast was injected via the existing biliary drainage catheter. The existing biliary drainage catheter was cut and cannulated with an Amplatz wire which was coiled within the proximal small bowel. Under intermittent fluoroscopic guidance, the existing PBD was exchanged for a 8 French, 35 cm, radiopaque vascular sheath which was advanced to the level of the duodenum. Several pull-back sheath cholangiogram were obtained following the injection of a small amount of contrast via the side arm of the sheath. Multiple spot fluoroscopic and radiographic images were obtained in various obliquities. Next, alongside the Amplatz wire, 3 biliary brush biopsies were obtained of the irregular narrowing involving the distal aspect of the CBD under direct fluoroscopic  guidance. Multiple fluoroscopic images were saved for procedural documentation purposes Next, under intermittent fluoroscopic guidance, the vascular sheath was exchanged for a new 10 [place] PBD with end ultimately coiled and locked within the duodenum. Small amount of contrast was injected via the exchanged biliary drainage catheter and a post exchange spot fluoroscopic image was obtained. The catheter was locked and secured to the skin with a single interrupted suture. The biliary drainage catheter was capped. A dressing was placed. The patient tolerated the procedure well without immediate postprocedural complication. FINDINGS: The existing percutaneous biliary catheter is appropriately positioned and functioning. Sheath cholangiogram demonstrates moderate length irregular narrowing involving the distal aspect of the CBD. Technically successful fluoroscopic guided biliary brush biopsy of the irregular narrowing of the distal aspect CBD. After fluoroscopic guided exchange, the new 10 [place] biliary drainage catheter is positioned with end coiled and locked within the duodenum. IMPRESSION: 1. Successful fluoroscopic guided biliary brush biopsy of moderate length irregular narrowing involving the distal aspect of the CBD. 2. Successful fluoroscopic guided exchange of 10 [place] biliary drainage catheter with end coiled and locked within the duodenum. PLAN: - The patient will return to the [facility] interventional radiology department in 2 weeks (January 22nd) for attempted biliary stent placement. Pending the results of today's biopsy, repeat attempted biliary brush biopsy could be performed at that time as indicated. - In the meantime, the patient's biliary drainage catheter was capped for a trial of internalization. The patient was given a gravity bag and instructed to reconnect the biliary drainage catheter to a gravity bag if he were to experience excessive pericatheter leakage and/or recurrent obstructive  symptoms. - The patient and the patient's wife was instructed to flush the percutaneous medially drainage catheter twice per day for the next 3 days followed by once per day for the subsequent 3 days and then to no longer flush the biliary drainage catheter. The patient and the patient's wife demonstrated excellent understanding of the above conversation. Electronically Signed   By: Sandi Mariscal M.D.   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 Patient presents today for fluoroscopic guided cholangiogram, biliary brush biopsy and biliary drainage catheter exchange. EXAM: 1. DIAGNOSTIC SHEATH CHOLANGIOGRAM 2. FLUOROSCOPIC GUIDED BILIARY BRUSH BIOPSY 3. FLUOROSCOPIC GUIDED BILIARY DRAINAGE CATHETER EXCHANGE COMPARISON:  COMPARISON Image guided biliary drainage catheter placement-11/09/2018; MRCP-11/07/2018 CONTRAST:  45 mL Omnipaque-300  administered into the biliary system FLUOROSCOPY TIME:  3 minutes, 30 seconds (195.0 mGy) COMPLICATIONS: None immediate. TECHNIQUE: Informed written consent was obtained from the patient after a discussion of the risks, benefits and alternatives to treatment. Questions regarding the procedure were encouraged and answered. A timeout was performed prior to the initiation of the procedure. The external portion of the existing right-sided percutaneous biliary drainage catheter as well as the surrounding skin were prepped and draped in the usual sterile fashion. A sterile drape was applied covering the operative field. Maximum barrier sterile technique with sterile gowns and gloves were used for the procedure. A timeout was performed prior to the initiation of the procedure. A pre  procedural spot fluoroscopic image was obtained after contrast was injected via the existing biliary drainage catheter. The existing biliary drainage catheter was cut and cannulated with an Amplatz wire which was coiled within the proximal small bowel. Under intermittent fluoroscopic guidance, the existing PBD was exchanged for a 8 French, 35 cm, radiopaque vascular sheath which was advanced to the level of the duodenum. Several pull-back sheath cholangiogram were obtained following the injection of a small amount of contrast via the side arm of the sheath. Multiple spot fluoroscopic and radiographic images were obtained in various obliquities. Next, alongside the Amplatz wire, 3 biliary brush biopsies were obtained of the irregular narrowing involving the distal aspect of the CBD under direct fluoroscopic guidance. Multiple fluoroscopic images were saved for procedural documentation purposes Next, under intermittent fluoroscopic guidance, the vascular sheath was exchanged for a new 10 [place] PBD with end ultimately coiled and locked within the duodenum. Small amount of contrast was injected via the exchanged biliary drainage catheter and a post exchange spot fluoroscopic image was obtained. The catheter was locked and secured to the skin with a single interrupted suture. The biliary drainage catheter was capped. A dressing was placed. The patient tolerated the procedure well without immediate postprocedural complication. FINDINGS: The existing percutaneous biliary catheter is appropriately positioned and functioning. Sheath cholangiogram demonstrates moderate length irregular narrowing involving the distal aspect of the CBD. Technically successful fluoroscopic guided biliary brush biopsy of the irregular narrowing of the distal aspect CBD. After fluoroscopic guided exchange, the new 10 [place] biliary drainage catheter is positioned with end coiled and locked within the duodenum. IMPRESSION: 1. Successful  fluoroscopic guided biliary brush biopsy of moderate length irregular narrowing involving the distal aspect of the CBD. 2. Successful fluoroscopic guided exchange of 10 [place] biliary drainage catheter with end coiled and locked within the duodenum. PLAN: - The patient will return to the [facility] interventional radiology department in 2 weeks (January 22nd) for attempted biliary stent placement. Pending the results of today's biopsy, repeat attempted biliary brush biopsy could be performed at that time as indicated. - In the meantime, the patient's biliary drainage catheter was capped for a trial of internalization. The patient was given a gravity bag and instructed to reconnect the biliary drainage catheter to a gravity bag if he were to experience excessive pericatheter leakage and/or recurrent obstructive symptoms. - The patient and the patient's wife was instructed to flush the percutaneous medially drainage catheter twice per day for the next 3 days followed by once per day for the subsequent 3 days and then to no longer flush the biliary drainage catheter. The patient and the patient's wife demonstrated excellent understanding of the above conversation. Electronically Signed   By: Sandi Mariscal M.D.   On: 11/23/2018 13:34    Labs:  CBC: Recent  Labs    11/20/18 0400 11/23/18 0950 11/28/18 1037 12/06/18 1140  WBC 11.6* 13.1* 16.4* 8.5  HGB 16.2 16.3 15.3 13.6  HCT 54.5* 53.8* 51.6 44.9  PLT 232 283 242 173    COAGS: Recent Labs    11/07/18 1600 11/08/18 0500 11/23/18 0950 12/06/18 1140  INR 1.11 1.16 1.23 1.01    BMP: Recent Labs    11/22/18 0619 11/23/18 0950 11/28/18 1037 12/05/18 0952 12/06/18 1140  NA 140 137 150* 134* 132*  K 3.8 4.4 4.8 3.7 3.8  CL 106 100 107 94* 98  CO2 23 27 34* 31 22  GLUCOSE 231* 395* 254* 145* 102*  BUN 98* 108* 60* 19 14  CALCIUM 8.2* 9.6 9.4 8.4 8.1*  CREATININE 1.22 1.42* 1.14 0.85 0.72  GFRNONAA >60 54* >60  --  >60  GFRAA >60 >60 >60   --  >60    LIVER FUNCTION TESTS: Recent Labs    11/19/18 2316 11/23/18 0950 11/28/18 1037 12/06/18 1140  BILITOT 1.7* 1.5* 0.9 1.4*  AST 22 32 28 32  ALT 48* 46* 57* 25  ALKPHOS 174* 130* 196* 166*  PROT 8.5* 8.0 7.3 6.2*  ALBUMIN 3.3* 3.2* 2.7* 2.5*    TUMOR MARKERS: No results for input(s): AFPTM, CEA, CA199, CHROMGRNA in the last 8760 hours.  Assessment and Plan:  FUMIO VANDAM is a 58 y.o. male with past medical history significant for CAD (post myocardial infarction and coronary stent placement), hypertension, hyperlipidemia, diabetes, obesity (post gastric bypass surgery) and esophageal cancer who was found to have presumed malignant biliary obstruction post percutaneous biliary drainage catheter placement on 11/09/2018.  Patient subsequent undergoing diagnostic angiogram and biliary brush biopsy on 11/23/2018 with inconclusive results.  Patient returns today for repeat diagnostic cholangiogram, biliary brush biopsy and attempted placement of a biliary stent.  Risks and benefits of diagnostic cholangiogram, biliary brush biopsy, attempted biliary stent placement and fluoroscopic guided biliary drainage catheter exchange was discussed with the patient including, but not limited to bleeding, nondiagnostic biopsy, infection which may lead to sepsis or even death and damage to adjacent structures.  All of the patient's questions were answered, patient is agreeable to proceed.  Consent signed and in chart.  A copy of this report was sent to the requesting provider on this date.  Electronically Signed: Sandi Mariscal, MD 12/06/2018, 2:10 PM   I spent a total of 10 Minutes in face to face in clinical consultation, greater than 50% of which was counseling/coordinating care for plan to come cholangiogram, repeat biliary brush biopsy, biliary stent placement and biliary drainage catheter exchange

## 2018-12-07 ENCOUNTER — Telehealth: Payer: Self-pay

## 2018-12-07 NOTE — Telephone Encounter (Signed)
Routing to provider  

## 2018-12-07 NOTE — Telephone Encounter (Signed)
PPW came in for update of Social work.

## 2018-12-08 NOTE — Progress Notes (Signed)
[name]   [phone] [phone]   Clinic Follow up Note   Patient Care Team: [name], [place] as PCP - General (Physician Assistant) [name], MD as PCP - Cardiology (Cardiology) Truitt Merle, MD as Consulting Physician (Medical Oncology) [name] Kirke Corin, MD as Consulting Physician (Gastroenterology) [name], MD as Consulting Physician (Radiation Oncology)  Date of Service:  12/11/2018  CHIEF COMPLAINT: F/u of esophogeal cancer  SUMMARY OF ONCOLOGIC HISTORY: Oncology History   Cancer Staging Cancer of lower third of esophagus [facility]) Staging form: Esophagus - Adenocarcinoma, AJCC 8th Edition - Clinical stage from 10/11/2018: Stage IVB (cTX, cN2, pM1) - Signed by Truitt Merle, MD on 10/17/2018       Cancer of lower third of esophagus ([place])   09/20/2018 Procedure    Upper Endoscopy by Dr. [name] 09/20/18  IMPRESSION - Normal larynx. - Partially obstructing, likely malignant esophageal tumor was found in the lower third of the esophagus. Biopsied. - Gastric bypass with a normal-sized pouch and gastrojejunal anastomosis characterized by ulceration. - Normal examined jejunum.    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    09/26/2018 Initial Diagnosis    Cancer of lower third of esophagus ([place])    09/29/2018 PET scan    PET 09/29/18  IMPRESSION: 1. Focal hypermetabolism in the distal esophagus is consistent with the patient's known malignancy. 2. Areas of ill-defined upper normal to mildly enlarged lymph nodes identified in the left supraclavicular region, left thoracic inlet, upper abdomen, and para-aortic retroperitoneal space. These areas also show stranding around the lymph nodes. Low level FDG accumulation in these lymph  nodes is borderline for infectious versus neoplastic etiology. While metastatic disease is certainly a consideration, infectious/inflammatory etiology is also possible and lymphoma cannot be excluded.    09/30/2018 PET scan    09/30/2018 PET IMPRESSION: 1. Focal hypermetabolism in the distal esophagus is consistent with the patient's known malignancy. 2. Areas of ill-defined upper normal to mildly enlarged lymph nodes identified in the left supraclavicular region, left thoracic inlet, upper abdomen, and para-aortic retroperitoneal space. These areas also show stranding around the lymph nodes. Low level FDG accumulation in these lymph nodes is borderline for infectious versus neoplastic etiology. While metastatic disease is certainly a consideration, infectious/inflammatory etiology is also possible and lymphoma cannot be excluded.     10/11/2018 Imaging    10/11/2018 [place] Core Biopsy FINDINGS: Bilobed abnormal lymph node or adjacent lymph nodes in the left supraclavicular region. This bilobed lymphadenopathy was successfully biopsied. Needle position was confirmed within this lesion.  IMPRESSION: Ultrasound-guided left supraclavicular lymph node biopsy.    10/11/2018 Cancer Staging    Staging form: Esophagus - Adenocarcinoma, AJCC 8th Edition - Clinical stage from 10/11/2018: Stage IVB (cTX, cN2, pM1) - Signed by Truitt Merle, MD on 10/17/2018    10/11/2018 Pathology Results    Left supraclavicular lymph node biopsy showed metastatic adenocarcinoma.     10/23/2018 - 11/09/2018 Radiation Therapy    With Dr. Lisbeth Renshaw    11/07/2018 Imaging    11/07/2018 MRI Abdomen IMPRESSION: 1. Marked narrowing in a 2.3 cm segment of the common bile duct corresponding with the segment of the duct along the pancreas. I do not see a definite pancreatic lesion but there is some faintly accentuated enhancement in the CBD in this vicinity. This  could be from localized inflammation/cholangitis or a  stricture. The CBD proximal to this stricture is 0.8 cm in diameter. Sludge noted in the gallbladder without definite stones. 2. Distal esophageal wall thickening and thickening in the proximal stomach. There are also postoperative findings in the stomach. Surrounding inflammatory stranding is noted around the distal esophagus and proximal stomach, possibly therapy related. Surrounding small lymph nodes noted along with enlarged lymph nodes in the porta hepatis which are nonspecific and could be reactive or malignant. 3. Scarring or atelectasis in the right lower lobe.    11/07/2018 Imaging    11/07/2018 US Abdomen IMPRESSION: 1. Dilatation of the common bile duct and gallbladder without obstructing lesion identified. No gallstones are evident but there is sludge within the lumen gallbladder. Consider contrast MRCP to further evaluate etiology of potential distal biliary obstruction. 2. No intrahepatic biliary duct dilatation identified.    11/07/2018 - 11/11/2018 Hospital Admission    Admit date: 11/07/2018 Admission diagnosis: Obstructive Jaundice Additional comments: PBD placement done    11/09/2018 Procedure    11/09/2018 PBD IMPRESSION: Successful placement of a 10.2 French percutaneous biliary drainage catheter with end coiled and locked within the duodenum.    11/19/2018 - 11/22/2018 Hospital Admission    Admit date: 11/19/2018 Admission diagnosis: Hyperglycemia Additional comments:     11/2018 -  Chemotherapy    PENDING FOLFOX and Herceptin starting in 12/2018      CURRENT THERAPY:  PENDING FOLFOX and Herceptin in 2 weeks   INTERVAL HISTORY:  [name] is here for a follow up. He presents to the clinic today with his wife. He notes being in a lot of pain after running out of oxycodone and hydrocodone. He does note oxycodone was too much pain medication. He will have biliary draining tube taken out soon. He is trying to eat by mouth. He is moving around more. He  has cough and choking with eating. He has rescue inhaler and nebulizer and would like a refill. He may need a swallow test suggested by Dr. Nicole Cella.     REVIEW OF SYSTEMS:   Constitutional: Denies fevers, chills or abnormal weight loss Eyes: Denies blurriness of vision Ears, nose, mouth, throat, and face: Denies mucositis or sore throat Respiratory: Denies cough, dyspnea or wheezes Cardiovascular: Denies palpitation, chest discomfort or lower extremity swelling Gastrointestinal:  Denies nausea, heartburn or change in bowel habits Skin: Denies abnormal skin rashes Lymphatics: Denies new lymphadenopathy or easy bruising Neurological:Denies numbness, tingling or new weaknesses Behavioral/Psych: Mood is stable, no new changes  All other systems were reviewed with the patient and are negative.  MEDICAL HISTORY:  Past Medical History:  Diagnosis Date  . Anxiety 2009   started with divorce  . Cancer [facility])    cancer of lower third of esophagus  . Diabetes mellitus without complication ([place])   . Gastric ulcer    from NSAID overuse  . GERD (gastroesophageal reflux disease)   . Hyperlipidemia   . Hypertension   . MI (myocardial infarction) ([place]) 2011   has two stents  . Obesity    highest weight in 500's    SURGICAL HISTORY: Past Surgical History:  Procedure Laterality Date  . [place] SURGERY  2010  . core biopsy  10/11/2018   core biopsy of lymph nodes in Interventional Radiology  . GASTRIC BYPASS  11/24/2008  . GASTROJEJUNOSTOMY N/A 10/17/2018   Procedure: LAPAROSCOPIC PLACEMENT OF  LEFT FEEDING JEJUNOSTOMY TUBE;  Surgeon: Michael Boston, MD;  Location: [facility] ORS;  Service:  General;  Laterality: N/A;  . IR BILIARY STENT(S) EXISTING ACCESS INC DILATION CATH EXCHANGE  12/06/2018  . IR ENDOLUMINAL BX OF BILIARY TREE  11/23/2018  . IR ENDOLUMINAL BX OF BILIARY TREE  12/06/2018  . IR EXCHANGE BILIARY DRAIN  11/23/2018  . IR INT EXT BILIARY DRAIN WITH CHOLANGIOGRAM  11/09/2018  . JOINT  REPLACEMENT     Bilateral knee  . PORTACATH PLACEMENT Right 10/17/2018   Procedure: INSERTION PORT-A-CATH WITH ULTRASOUND AND FLUORO;  Surgeon: Michael Boston, MD;  Location: [facility]S;  Service: General;  Laterality: Right;  . TONSILECTOMY/ADENOIDECTOMY WITH MYRINGOTOMY      I have reviewed the social history and family history with the patient and they are unchanged from previous note.  ALLERGIES:  is allergic to penicillins.  MEDICATIONS:  Current Outpatient Medications  Medication Sig Dispense Refill  . albuterol (PROVENTIL HFA;VENTOLIN HFA) 108 (90 Base) MCG/ACT inhaler Inhale 2 puffs into the lungs every 6 (six) hours as needed for wheezing or shortness of breath. 1 Inhaler 2  . bisacodyl (DULCOLAX) 10 MG suppository Place 1 suppository (10 mg total) rectally as needed for moderate constipation. 12 suppository 0  . cloNIDine (CATAPRES - DOSED IN MG/24 HR) 0.1 mg/24hr patch Place 1 patch (0.1 mg total) onto the skin once a week. 4 patch 12  . dicyclomine (BENTYL) 10 MG capsule Take 1 capsule (10 mg total) by mouth 4 (four) times daily -  before meals and at bedtime. 30 capsule 1  . insulin NPH Human (HUMULIN N,NOVOLIN N) 100 UNIT/ML injection Inject 0.18 mLs (18 Units total) into the skin 2 (two) times daily at 8 am and 10 pm. (Patient taking differently: Inject 25 Units into the skin 2 (two) times daily at 8 am and 10 pm. ) 10 mL 1  . insulin regular (NOVOLIN R,HUMULIN R) 100 units/mL injection For glucose 121 to 150 use 3 units, for 151 to 200 use 4 units, for 201-250 use 7 units, for 251-300 use 11 units, for 301 to 350 use 15 units. FOR 351-400 use 20 units (Patient taking differently: 2 (two) times daily before a meal. For glucose 121 to 150 use 3 units, for 151 to 200 use 4 units, for 201-250 use 7 units, for 251-300 use 11 units, for 301 to 350 use 15 units. FOR 351-400 use 20 units) 10 mL 0  . LORazepam (ATIVAN) 0.5 MG tablet Place 1 tablet (0.5 mg total) under the tongue every 6 (six)  hours as needed for anxiety. 40 tablet 2  . metoprolol tartrate (LOPRESSOR) 25 MG tablet Take 0.5 tablets (12.5 mg total) by mouth 2 (two) times daily. 30 tablet 0  . Nutritional Supplements (FEEDING SUPPLEMENT, GLUCERNA 1.5 CAL,) LIQD Place 1,000 mLs into feeding tube continuous. Start @ 61m/hr, increase by 139mevery 8 hours to a goal of 7042mr 1000 mL 30  . ondansetron (ZOFRAN) 4 MG/5ML solution Place 5 mLs (4 mg total) into feeding tube every 8 (eight) hours as needed for nausea or vomiting. 50 mL 1  . Oxycodone HCl 10 MG TABS Take 1 tablet (10 mg total) by mouth every 8 (eight) hours as needed. 10 tablet 0  . PARoxetine (PAXIL) 40 MG tablet Take 1/2 tablet daily po x 1 month. Then increase to 1 tablet daily. 90 tablet 1  . polyethylene glycol (MIRALAX / GLYCOLAX) packet 17 g by Per J Tube route daily as needed for mild constipation. 14 each 0  . prochlorperazine (COMPAZINE) 10 MG tablet Take 1 tablet (  10 mg total) by mouth every 6 (six) hours as needed for nausea or vomiting. 30 tablet 2  . scopolamine (TRANSDERM-SCOP) 1 MG/3DAYS Place 1 patch (1.5 mg total) onto the skin every 3 (three) days. 10 patch 5  . Syringe, Disposable, 3 ML MISC Insulin syringes and needles. 100 each 0  . Water For Irrigation, Sterile (FREE WATER) SOLN Place 100 mLs into feeding tube every 4 (four) hours.    Marland Kitchen albuterol (PROVENTIL) (2.5 MG/3ML) 0.083% nebulizer solution Take 3 mLs (2.5 mg total) by nebulization every 6 (six) hours as needed for wheezing or shortness of breath. 75 mL 12  . HYDROcodone-acetaminophen (NORCO) 7.5-325 MG tablet Take 1 tablet by mouth every 6 (six) hours as needed for moderate pain. 30 tablet 0  . ondansetron (ZOFRAN) 8 MG tablet Take 1 tablet (8 mg total) by mouth every 8 (eight) hours as needed for nausea or vomiting. 30 tablet 1   No current facility-administered medications for this visit.     PHYSICAL EXAMINATION: ECOG PERFORMANCE STATUS: 2 - Symptomatic, <50% confined to  bed  Vitals:   12/11/18 1205  BP: (!) 141/77  Pulse: 74  Resp: 17  Temp: 98.3 F (36.8 C)  SpO2: 100%   Filed Weights   12/11/18 1205  Weight: 270 lb 1.6 oz (122.5 kg)    GENERAL:alert, no distress and comfortable SKIN: skin color, texture, turgor are normal, no rashes or significant lesions EYES: normal, Conjunctiva are pink and non-injected, sclera clear OROPHARYNX:no exudate, no erythema and lips, buccal mucosa, and tongue normal  NECK: supple, thyroid normal size, non-tender, without nodularity LYMPH:  no palpable lymphadenopathy in the cervical, axillary or inguinal LUNGS: clear to auscultation and percussion with normal breathing effort HEART: regular rate & rhythm and no murmurs and no lower extremity edema ABDOMEN:abdomen soft, non-tender and normal bowel sounds, two biliary draining tubes are capped. No discharge  Musculoskeletal:no cyanosis of digits and no clubbing  NEURO: alert & oriented x 3 with fluent speech, no focal motor/sensory deficits  LABORATORY DATA:  I have reviewed the data as listed CBC Latest Ref Rng & Units 12/11/2018 12/06/2018 11/28/2018  WBC 4.0 - 10.5 K/uL 5.8 8.5 16.4(H)  Hemoglobin 13.0 - 17.0 g/dL 13.0 13.6 15.3  Hematocrit 39.0 - 52.0 % 42.1 44.9 51.6  Platelets 150 - 400 K/uL 237 173 242     CMP Latest Ref Rng & Units 12/11/2018 12/06/2018 12/05/2018  Glucose 70 - 99 mg/dL 84 102(H) 145(H)  BUN 6 - 20 mg/dL '13 14 19  ' Creatinine 0.61 - 1.24 mg/dL 0.70 0.72 0.85  Sodium 135 - 145 mmol/L 130(L) 132(L) 134(L)  Potassium 3.5 - 5.1 mmol/L 4.1 3.8 3.7  Chloride 98 - 111 mmol/L 92(L) 98 94(L)  CO2 22 - 32 mmol/L 32 22 31  Calcium 8.9 - 10.3 mg/dL 8.4(L) 8.1(L) 8.4  Total Protein 6.5 - 8.1 g/dL 6.3(L) 6.2(L) -  Total Bilirubin 0.3 - 1.2 mg/dL 0.8 1.4(H) -  Alkaline Phos 38 - 126 U/L 608(H) 166(H) -  AST 15 - 41 U/L 53(H) 32 -  ALT 0 - 44 U/L 48(H) 25 -      RADIOGRAPHIC STUDIES: I have personally reviewed the radiological images as listed  and agreed with the findings in the report. No results found.   ASSESSMENT & PLAN:  Darrell Sutton is a 58 y.o. male with   1.Metastatic esophageal adenocarcinomato nodes, stage IV, HER2(+), MSS  -Diagnosed in 09/2018. Due to significant dysphagia, he was initially treated  with palliative radiation first. He is also on PEG tube feeding due to significant dysphagia.  -he developed obstructive jaundice after radiation, status post percutaneous biliary draining tube twice, and had biliary stent placed last week. He is recovering well  -Labs reviewed, Ca at 8.4, slightly elevated LFTs. His symptoms are improving, especially after biliary stent placement.  -his dysphagia has gradually improved, he is able to tolerate liquid, but has some cough with swallowing.  We will set up a swallow evaluation. -I recommend him to consider systemic chemotherapy, for disease control and prolong his life. -I recommend first line FOLFOX and Herceptin every 2 weeks. His tumor is positive for HER2, I discussed with pt   --Chemotherapy consent: Side effects including but does not limited to, fatigue, nausea, vomiting, diarrhea, hair loss, neuropathy, fluid retention, renal and kidney dysfunction, neutropenic fever, needed for blood transfusion, bleeding, reversible cardiomyopathy, skin rash, were discussed with patient in great detail. He agreed to proceed.  -The goal of therapy is palliative, to prolong his life -I will obtain a echocardiogram before Herceptin, and monitor every 3 to 4 months while he is on Herceptin -Will f/u in 2 weeks to start FOLFOX and herceptin -I will ask path to check PD-L1 on his biopsy    2.Transaminitis and hyperbilirubinemia -He developed obstructive jaundice lately, status post percutaneous biliary drainage tube placement, brushing was negative for malignant cells.  No liver mets on CT  -Etiology unclear, possible related to regional metastatic adenopathy  -He has bilateral  percutaneous biliary draining tube has been capped, and had stent placed on 12/06/18.  -Will get draining tube removed soon.    3. Type 2 DMwith uncontrolled hyperglycemia -Currently on insulin. Continue and f/u with dietician. -On 11/28/18 I reviewed his BG log and discussed close monitoring of his BG at home and advised him to increase his NPH insulin from 18u to 25u and continue ISS.  -BG today is 84 today (12/11/18), much improved lately   4.Hypertension -Currently on liquid medications, controlled   5. Insomnia -Currently on Ativan 0.5 mg at bedtime as needed. Continue.  6. Nausea and abdominal cramps  -Currently uses zofran, I called in Compazine and Bentyl. He prefers Zofran, I refilled today  -He was not able to tolerate oxycodone, so I will refill her hydrocodone today (12/11/18)    7. History of gastric by-pass surgery  8. Financial Support -He is out of work -His insurance status is pending.  -He is currently getting financial help from his father-in-law for now -I will send him to our financial office to see if he is eligible for grant   9. Low appetite, dysphagia, weight loss -Secondary to cancer   -He has PEG tube in place and continues to tube feed -His dysphagia has much improved since her radiation -He will continue to followup with Dietician Ernestene Kiel. I will contact her about swallow teasing.   10. Goal of care discussion  -We again discussed the incurable nature of his cancer, and the overall poor prognosis, especially if he does not have good response to chemotherapy or progress on chemo -The patient understands the goal of care is palliative. -He is full code now    Plan  -I refilled hydrocodone and zofran and nebulizer solution today  -Lab, flush, f/u and chemo FOLFOX and herceptin in 2 and 4 weeks  -Follow-up evaluation, echocardiogram -We will ask pathology lab to do PD-L1 test on his tumor biopsy    No problem-specific Assessment &  Plan  notes found for this encounter.   Orders Placed This Encounter  Procedures  . SLP eval and treat    Standing Status:   Future    Standing Expiration Date:   12/12/2019  . ECHOCARDIOGRAM COMPLETE    Standing Status:   Future    Standing Expiration Date:   03/11/2020    Order Specific Question:   Where should this test be performed    Answer:   [place] [facility]    Order Specific Question:   Perflutren DEFINITY (image enhancing agent) should be administered unless hypersensitivity or allergy exist    Answer:   Administer Perflutren    Order Specific Question:   Reason for exam-Echo    Answer:   Chemotherapy evaluation  v87.41 / v58.11   All questions were answered. The patient knows to call the clinic with any problems, questions or concerns. No barriers to learning was detected. I spent 30 minutes counseling the patient face to face. The total time spent in the appointment was 40 minutes and more than 50% was on counseling and review of test results     Truitt Merle, MD 12/11/2018   I, Joslyn Devon, am acting as scribe for Truitt Merle, MD.   I have reviewed the above documentation for accuracy and completeness, and I agree with the above.

## 2018-12-08 NOTE — Telephone Encounter (Signed)
Orders signed by Dr. Juleen China and faxed.

## 2018-12-08 NOTE — Telephone Encounter (Signed)
Please let me know what I need to fill out for this. I haven't seen any paperwork.

## 2018-12-11 ENCOUNTER — Telehealth: Payer: Self-pay | Admitting: Hematology

## 2018-12-11 ENCOUNTER — Inpatient Hospital Stay: Payer: Self-pay

## 2018-12-11 ENCOUNTER — Other Ambulatory Visit: Payer: Self-pay | Admitting: Radiology

## 2018-12-11 ENCOUNTER — Inpatient Hospital Stay (HOSPITAL_BASED_OUTPATIENT_CLINIC_OR_DEPARTMENT_OTHER): Payer: Self-pay | Admitting: Hematology

## 2018-12-11 ENCOUNTER — Encounter: Payer: Self-pay | Admitting: Hematology

## 2018-12-11 VITALS — BP 141/77 | HR 74 | Temp 98.3°F | Resp 17 | Ht 72.0 in | Wt 270.1 lb

## 2018-12-11 DIAGNOSIS — E1165 Type 2 diabetes mellitus with hyperglycemia: Secondary | ICD-10-CM

## 2018-12-11 DIAGNOSIS — C778 Secondary and unspecified malignant neoplasm of lymph nodes of multiple regions: Secondary | ICD-10-CM

## 2018-12-11 DIAGNOSIS — R109 Unspecified abdominal pain: Secondary | ICD-10-CM

## 2018-12-11 DIAGNOSIS — G4700 Insomnia, unspecified: Secondary | ICD-10-CM

## 2018-12-11 DIAGNOSIS — E119 Type 2 diabetes mellitus without complications: Secondary | ICD-10-CM

## 2018-12-11 DIAGNOSIS — Z9484 Stem cells transplant status: Secondary | ICD-10-CM

## 2018-12-11 DIAGNOSIS — R634 Abnormal weight loss: Secondary | ICD-10-CM

## 2018-12-11 DIAGNOSIS — C155 Malignant neoplasm of lower third of esophagus: Secondary | ICD-10-CM

## 2018-12-11 DIAGNOSIS — I2510 Atherosclerotic heart disease of native coronary artery without angina pectoris: Secondary | ICD-10-CM

## 2018-12-11 DIAGNOSIS — R630 Anorexia: Secondary | ICD-10-CM

## 2018-12-11 DIAGNOSIS — R1310 Dysphagia, unspecified: Secondary | ICD-10-CM

## 2018-12-11 DIAGNOSIS — Z95828 Presence of other vascular implants and grafts: Secondary | ICD-10-CM

## 2018-12-11 DIAGNOSIS — R110 Nausea: Secondary | ICD-10-CM

## 2018-12-11 DIAGNOSIS — R740 Nonspecific elevation of levels of transaminase and lactic acid dehydrogenase [LDH]: Secondary | ICD-10-CM

## 2018-12-11 LAB — CBC WITH DIFFERENTIAL (CANCER CENTER ONLY)
ABS IMMATURE GRANULOCYTES: 0.03 10*3/uL (ref 0.00–0.07)
Basophils Absolute: 0 10*3/uL (ref 0.0–0.1)
Basophils Relative: 0 %
Eosinophils Absolute: 0.1 10*3/uL (ref 0.0–0.5)
Eosinophils Relative: 2 %
HCT: 42.1 % (ref 39.0–52.0)
Hemoglobin: 13 g/dL (ref 13.0–17.0)
Immature Granulocytes: 1 %
Lymphocytes Relative: 3 %
Lymphs Abs: 0.2 10*3/uL — ABNORMAL LOW (ref 0.7–4.0)
MCH: 24.5 pg — ABNORMAL LOW (ref 26.0–34.0)
MCHC: 30.9 g/dL (ref 30.0–36.0)
MCV: 79.4 fL — ABNORMAL LOW (ref 80.0–100.0)
MONO ABS: 0.7 10*3/uL (ref 0.1–1.0)
Monocytes Relative: 12 %
NEUTROS ABS: 4.8 10*3/uL (ref 1.7–7.7)
Neutrophils Relative %: 82 %
Platelet Count: 237 10*3/uL (ref 150–400)
RBC: 5.3 MIL/uL (ref 4.22–5.81)
RDW: 20.2 % — ABNORMAL HIGH (ref 11.5–15.5)
WBC Count: 5.8 10*3/uL (ref 4.0–10.5)
nRBC: 0 % (ref 0.0–0.2)

## 2018-12-11 LAB — CMP (CANCER CENTER ONLY)
ALT: 48 U/L — ABNORMAL HIGH (ref 0–44)
AST: 53 U/L — ABNORMAL HIGH (ref 15–41)
Albumin: 2.2 g/dL — ABNORMAL LOW (ref 3.5–5.0)
Alkaline Phosphatase: 608 U/L — ABNORMAL HIGH (ref 38–126)
Anion gap: 6 (ref 5–15)
BUN: 13 mg/dL (ref 6–20)
CO2: 32 mmol/L (ref 22–32)
Calcium: 8.4 mg/dL — ABNORMAL LOW (ref 8.9–10.3)
Chloride: 92 mmol/L — ABNORMAL LOW (ref 98–111)
Creatinine: 0.7 mg/dL (ref 0.61–1.24)
GFR, Est AFR Am: >60 mL/min (ref >60)
GFR, Estimated: >60 mL/min (ref >60)
Glucose, Bld: 84 mg/dL (ref 70–99)
Potassium: 4.1 mmol/L (ref 3.5–5.1)
Sodium: 130 mmol/L — ABNORMAL LOW (ref 135–145)
Total Bilirubin: 0.8 mg/dL (ref 0.3–1.2)
Total Protein: 6.3 g/dL — ABNORMAL LOW (ref 6.5–8.1)

## 2018-12-11 MED ORDER — ONDANSETRON HCL 8 MG PO TABS: 8.0000 mg | ORAL_TABLET | Freq: Three times a day (TID) | ORAL | 1 refills | Status: AC | PRN

## 2018-12-11 MED ORDER — ALBUTEROL SULFATE (2.5 MG/3ML) 0.083% IN NEBU: 2.5000 mg | INHALATION_SOLUTION | Freq: Four times a day (QID) | RESPIRATORY_TRACT | 12 refills | Status: AC | PRN

## 2018-12-11 MED ORDER — HEPARIN SOD (PORK) LOCK FLUSH 100 UNIT/ML IV SOLN
500.0000 [IU] | Freq: Once | INTRAVENOUS | Status: AC
  Administered 2018-12-11: 500 [IU]
  Filled 2018-12-11: qty 5

## 2018-12-11 MED ORDER — HYDROCODONE-ACETAMINOPHEN 7.5-325 MG PO TABS: 1.0000 | ORAL_TABLET | Freq: Four times a day (QID) | ORAL | 0 refills | Status: DC | PRN

## 2018-12-11 MED ORDER — SODIUM CHLORIDE 0.9% FLUSH
10.0000 mL | Freq: Once | INTRAVENOUS | Status: AC
  Administered 2018-12-11: 10 mL
  Filled 2018-12-11: qty 10

## 2018-12-11 NOTE — Telephone Encounter (Signed)
Scheduled appt per 01/27 los.  Added patient treatment to the book for approval.  Printed calendar and avs.

## 2018-12-12 ENCOUNTER — Ambulatory Visit (HOSPITAL_COMMUNITY): Payer: MEDICAID

## 2018-12-12 ENCOUNTER — Encounter (HOSPITAL_COMMUNITY): Payer: Self-pay | Admitting: Interventional Radiology

## 2018-12-12 ENCOUNTER — Ambulatory Visit (HOSPITAL_COMMUNITY)
Admission: RE | Admit: 2018-12-12 | Discharge: 2018-12-12 | Disposition: A | Discharge status: Home / Self Care | Payer: Self-pay | Source: Ambulatory Visit | Attending: Interventional Radiology | Admitting: Interventional Radiology

## 2018-12-12 DIAGNOSIS — K831 Obstruction of bile duct: Secondary | ICD-10-CM | POA: Insufficient documentation

## 2018-12-12 DIAGNOSIS — Z4803 Encounter for change or removal of drains: Secondary | ICD-10-CM | POA: Insufficient documentation

## 2018-12-12 DIAGNOSIS — C801 Malignant (primary) neoplasm, unspecified: Secondary | ICD-10-CM | POA: Insufficient documentation

## 2018-12-12 HISTORY — PX: IR CHOLANGIOGRAM EXISTING TUBE: IMG6040

## 2018-12-12 MED ORDER — LIDOCAINE HCL 1 % IJ SOLN
INTRAMUSCULAR | Status: AC
  Filled 2018-12-12: qty 20

## 2018-12-12 MED ORDER — IOPAMIDOL (ISOVUE-300) INJECTION 61%
50.0000 mL | Freq: Once | INTRAVENOUS | Status: AC | PRN
  Administered 2018-12-12: 10 mL

## 2018-12-12 MED ORDER — IOPAMIDOL (ISOVUE-300) INJECTION 61%
INTRAVENOUS | Status: AC
  Administered 2018-12-12: 10 mL
  Filled 2018-12-12: qty 50

## 2018-12-12 NOTE — Procedures (Signed)
Biliary obstruction s/p stent  S/p cholangiogram and drain removal  Patent CBD wall flex stent  No comp Stable Full report in pacs

## 2018-12-13 ENCOUNTER — Other Ambulatory Visit: Payer: Self-pay | Admitting: Hematology

## 2018-12-15 ENCOUNTER — Ambulatory Visit (HOSPITAL_COMMUNITY)
Admission: RE | Admit: 2018-12-15 | Discharge: 2018-12-15 | Disposition: A | Discharge status: Home / Self Care | Payer: Self-pay | Source: Ambulatory Visit | Attending: Hematology | Admitting: Hematology

## 2018-12-15 DIAGNOSIS — C155 Malignant neoplasm of lower third of esophagus: Secondary | ICD-10-CM | POA: Insufficient documentation

## 2018-12-15 NOTE — Progress Notes (Signed)
  Echocardiogram 2D Echocardiogram has been performed.  Darlina Sicilian M 12/15/2018, 9:43 AM

## 2018-12-18 ENCOUNTER — Encounter: Payer: Self-pay | Admitting: Hematology

## 2018-12-18 ENCOUNTER — Telehealth: Payer: Self-pay | Admitting: Hematology

## 2018-12-18 ENCOUNTER — Other Ambulatory Visit: Payer: Self-pay | Admitting: Hematology

## 2018-12-18 MED ORDER — HYDROCODONE-ACETAMINOPHEN 7.5-325 MG PO TABS: 1.0000 | ORAL_TABLET | Freq: Four times a day (QID) | ORAL | 0 refills | Status: AC | PRN

## 2018-12-18 NOTE — Telephone Encounter (Signed)
Called patient to let the patient know his treatment time has been added for 02/10.  Patient aware of time and date.

## 2018-12-20 ENCOUNTER — Telehealth: Payer: Self-pay | Admitting: Hematology

## 2018-12-20 NOTE — Telephone Encounter (Signed)
Called patient to inform the patient that his treatment has been added for 02/24.  Patient aware of appt time.

## 2018-12-25 ENCOUNTER — Inpatient Hospital Stay: Payer: PRIVATE HEALTH INSURANCE | Attending: Hematology

## 2018-12-25 ENCOUNTER — Encounter: Payer: Self-pay | Admitting: Medical

## 2018-12-25 ENCOUNTER — Inpatient Hospital Stay (HOSPITAL_BASED_OUTPATIENT_CLINIC_OR_DEPARTMENT_OTHER): Payer: PRIVATE HEALTH INSURANCE | Admitting: Medical

## 2018-12-25 ENCOUNTER — Encounter: Payer: Self-pay | Admitting: Pharmacy Technician

## 2018-12-25 ENCOUNTER — Inpatient Hospital Stay: Payer: PRIVATE HEALTH INSURANCE

## 2018-12-25 VITALS — BP 152/102 | HR 97 | Temp 98.0°F | Resp 18

## 2018-12-25 DIAGNOSIS — C155 Malignant neoplasm of lower third of esophagus: Secondary | ICD-10-CM

## 2018-12-25 DIAGNOSIS — I252 Old myocardial infarction: Secondary | ICD-10-CM | POA: Insufficient documentation

## 2018-12-25 DIAGNOSIS — Z79899 Other long term (current) drug therapy: Secondary | ICD-10-CM | POA: Insufficient documentation

## 2018-12-25 DIAGNOSIS — I10 Essential (primary) hypertension: Secondary | ICD-10-CM | POA: Insufficient documentation

## 2018-12-25 DIAGNOSIS — Z9884 Bariatric surgery status: Secondary | ICD-10-CM | POA: Insufficient documentation

## 2018-12-25 DIAGNOSIS — K5900 Constipation, unspecified: Secondary | ICD-10-CM | POA: Diagnosis not present

## 2018-12-25 DIAGNOSIS — Z5111 Encounter for antineoplastic chemotherapy: Secondary | ICD-10-CM | POA: Diagnosis present

## 2018-12-25 DIAGNOSIS — G4700 Insomnia, unspecified: Secondary | ICD-10-CM | POA: Diagnosis not present

## 2018-12-25 DIAGNOSIS — Z794 Long term (current) use of insulin: Secondary | ICD-10-CM | POA: Diagnosis not present

## 2018-12-25 DIAGNOSIS — Z7189 Other specified counseling: Secondary | ICD-10-CM

## 2018-12-25 DIAGNOSIS — Z5112 Encounter for antineoplastic immunotherapy: Secondary | ICD-10-CM | POA: Diagnosis present

## 2018-12-25 DIAGNOSIS — E119 Type 2 diabetes mellitus without complications: Secondary | ICD-10-CM | POA: Diagnosis not present

## 2018-12-25 DIAGNOSIS — Z931 Gastrostomy status: Secondary | ICD-10-CM | POA: Diagnosis not present

## 2018-12-25 DIAGNOSIS — Z95828 Presence of other vascular implants and grafts: Secondary | ICD-10-CM

## 2018-12-25 LAB — CBC WITH DIFFERENTIAL (CANCER CENTER ONLY)
Abs Immature Granulocytes: 0.08 10*3/uL — ABNORMAL HIGH (ref 0.00–0.07)
Basophils Absolute: 0 10*3/uL (ref 0.0–0.1)
Basophils Relative: 1 %
Eosinophils Absolute: 0.1 10*3/uL (ref 0.0–0.5)
Eosinophils Relative: 1 %
HCT: 42.4 % (ref 39.0–52.0)
Hemoglobin: 13.3 g/dL (ref 13.0–17.0)
Immature Granulocytes: 1 %
LYMPHS ABS: 0.2 10*3/uL — AB (ref 0.7–4.0)
Lymphocytes Relative: 3 %
MCH: 26.2 pg (ref 26.0–34.0)
MCHC: 31.4 g/dL (ref 30.0–36.0)
MCV: 83.5 fL (ref 80.0–100.0)
Monocytes Absolute: 0.6 10*3/uL (ref 0.1–1.0)
Monocytes Relative: 7 %
Neutro Abs: 7.7 10*3/uL (ref 1.7–7.7)
Neutrophils Relative %: 87 %
Platelet Count: 264 10*3/uL (ref 150–400)
RBC: 5.08 MIL/uL (ref 4.22–5.81)
RDW: 21.7 % — ABNORMAL HIGH (ref 11.5–15.5)
WBC Count: 8.7 10*3/uL (ref 4.0–10.5)
nRBC: 0 % (ref 0.0–0.2)

## 2018-12-25 LAB — CMP (CANCER CENTER ONLY)
ALT: 31 U/L (ref 0–44)
AST: 36 U/L (ref 15–41)
Albumin: 2.5 g/dL — ABNORMAL LOW (ref 3.5–5.0)
Alkaline Phosphatase: 347 U/L — ABNORMAL HIGH (ref 38–126)
Anion gap: 9 (ref 5–15)
BUN: 14 mg/dL (ref 6–20)
CHLORIDE: 95 mmol/L — AB (ref 98–111)
CO2: 29 mmol/L (ref 22–32)
Calcium: 8.7 mg/dL — ABNORMAL LOW (ref 8.9–10.3)
Creatinine: 0.8 mg/dL (ref 0.61–1.24)
GFR, Est AFR Am: >60 mL/min (ref >60)
GFR, Estimated: >60 mL/min (ref >60)
Glucose, Bld: 217 mg/dL — ABNORMAL HIGH (ref 70–99)
Potassium: 4.6 mmol/L (ref 3.5–5.1)
Sodium: 133 mmol/L — ABNORMAL LOW (ref 135–145)
Total Bilirubin: 0.8 mg/dL (ref 0.3–1.2)
Total Protein: 6.3 g/dL — ABNORMAL LOW (ref 6.5–8.1)

## 2018-12-25 MED ORDER — OXALIPLATIN CHEMO INJECTION 100 MG/20ML
85.0000 mg/m2 | Freq: Once | INTRAVENOUS | Status: AC
  Administered 2018-12-25: 220 mg via INTRAVENOUS
  Filled 2018-12-25: qty 40

## 2018-12-25 MED ORDER — LEUCOVORIN CALCIUM INJECTION 350 MG
400.0000 mg/m2 | Freq: Once | INTRAVENOUS | Status: AC
  Administered 2018-12-25: 1032 mg via INTRAVENOUS
  Filled 2018-12-25: qty 51.6

## 2018-12-25 MED ORDER — DEXAMETHASONE SODIUM PHOSPHATE 10 MG/ML IJ SOLN
INTRAMUSCULAR | Status: AC
  Filled 2018-12-25: qty 1

## 2018-12-25 MED ORDER — ACETAMINOPHEN 325 MG PO TABS
ORAL_TABLET | ORAL | Status: AC
  Filled 2018-12-25: qty 2

## 2018-12-25 MED ORDER — PALONOSETRON HCL INJECTION 0.25 MG/5ML
INTRAVENOUS | Status: AC
  Filled 2018-12-25: qty 5

## 2018-12-25 MED ORDER — SODIUM CHLORIDE 0.9 % IV SOLN
2400.0000 mg/m2 | INTRAVENOUS | Status: DC
  Administered 2018-12-25: 6200 mg via INTRAVENOUS
  Filled 2018-12-25: qty 124

## 2018-12-25 MED ORDER — PALONOSETRON HCL INJECTION 0.25 MG/5ML
0.2500 mg | Freq: Once | INTRAVENOUS | Status: AC
  Administered 2018-12-25: 0.25 mg via INTRAVENOUS

## 2018-12-25 MED ORDER — SODIUM CHLORIDE 0.9% FLUSH
10.0000 mL | INTRAVENOUS | Status: DC | PRN
  Filled 2018-12-25: qty 10

## 2018-12-25 MED ORDER — DEXTROSE 5 % IV SOLN
Freq: Once | INTRAVENOUS | Status: AC
  Administered 2018-12-25: 11:00:00 via INTRAVENOUS
  Filled 2018-12-25: qty 250

## 2018-12-25 MED ORDER — DIPHENHYDRAMINE HCL 25 MG PO CAPS
ORAL_CAPSULE | ORAL | Status: AC
  Filled 2018-12-25: qty 2

## 2018-12-25 MED ORDER — SODIUM CHLORIDE 0.9 % IV SOLN
750.0000 mL | Freq: Once | INTRAVENOUS | Status: DC
  Filled 2018-12-25: qty 750

## 2018-12-25 MED ORDER — DEXAMETHASONE SODIUM PHOSPHATE 10 MG/ML IJ SOLN
10.0000 mg | Freq: Once | INTRAMUSCULAR | Status: AC
  Administered 2018-12-25: 10 mg via INTRAVENOUS

## 2018-12-25 MED ORDER — HEPARIN SOD (PORK) LOCK FLUSH 100 UNIT/ML IV SOLN
500.0000 [IU] | Freq: Once | INTRAVENOUS | Status: DC | PRN
  Filled 2018-12-25: qty 5

## 2018-12-25 MED ORDER — DIPHENHYDRAMINE HCL 25 MG PO CAPS
50.0000 mg | ORAL_CAPSULE | Freq: Once | ORAL | Status: AC
  Administered 2018-12-25: 50 mg via ORAL

## 2018-12-25 MED ORDER — TRASTUZUMAB CHEMO 150 MG IV SOLR
750.0000 mg | Freq: Once | INTRAVENOUS | Status: AC
  Administered 2018-12-25: 750 mg via INTRAVENOUS
  Filled 2018-12-25: qty 35.72

## 2018-12-25 MED ORDER — DEXTROSE 5 % IV SOLN
Freq: Once | INTRAVENOUS | Status: AC
  Administered 2018-12-25: 14:00:00 via INTRAVENOUS
  Filled 2018-12-25: qty 250

## 2018-12-25 MED ORDER — ACETAMINOPHEN 325 MG PO TABS
650.0000 mg | ORAL_TABLET | Freq: Once | ORAL | Status: AC
  Administered 2018-12-25: 650 mg via ORAL

## 2018-12-25 MED ORDER — SODIUM CHLORIDE 0.9% FLUSH
10.0000 mL | Freq: Once | INTRAVENOUS | Status: AC
  Administered 2018-12-25: 10 mL
  Filled 2018-12-25: qty 10

## 2018-12-25 NOTE — Patient Instructions (Signed)

## 2018-12-25 NOTE — Progress Notes (Signed)
The patient has insurance coverage through [employer].  Referred to [facility] for co-pay assistance.

## 2018-12-25 NOTE — Progress Notes (Signed)
OK to treat pending chemistry panel. Darrell Sutton, [name], [name]   [facility]pt[facility] Management [facility] Progress Note   Darrell Sutton [id number] [birth year] 58 y.o.  Darrell Sutton is managed by Dr. Truitt Merle  Actively treated with chemotherapy/immunotherapy/hormonal therapy: yes  Current Therapy: FOLFOX and Herceptin  Last Treated: The patient will receive cycle 1 today.  Assessment: Plan:    Cancer of lower third of esophagus ([place]) - Plan: DISCONTINUED: dextrose 5 % solution, DISCONTINUED: dextrose 5 % solution, DISCONTINUED: sodium chloride flush (NS) 0.9 % injection 10 mL, DISCONTINUED: heparin lock flush 100 unit/mL, DISCONTINUED: dexamethasone (DECADRON) injection 10 mg, DISCONTINUED: palonosetron (ALOXI) injection 0.25 mg, DISCONTINUED: oxaliplatin (ELOXATIN) 220 mg in dextrose 5 % 500 mL chemo infusion, DISCONTINUED: leucovorin 1,032 mg in dextrose 5 % 250 mL infusion, DISCONTINUED: fluorouracil (ADRUCIL) 6,200 mg in sodium chloride 0.9 % 126 mL chemo infusion, DISCONTINUED: acetaminophen (TYLENOL) tablet 650 mg, DISCONTINUED: diphenhydrAMINE (BENADRYL) capsule 50 mg, DISCONTINUED: trastuzumab (HERCEPTIN) 861 mg in sodium chloride 0.9 % 250 mL chemo infusion  Constipation, unspecified constipation type   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  He will proceed with chemotherapy today and will return in 2 weeks for consideration of cycle 2 of chemotherapy.  Constipation: The patient was given information regarding management of his constipation.  Please see the patient's after visit summary for details.  Please see After Visit Summary for patient specific instructions.  Future Appointments  Date Time Provider [place]  12/27/2018 11:00 AM [facility] [place] None  01/01/2019  2:00 PM Hayden Pedro, [name] [facility] None    01/08/2019  8:30 AM [facility] LAB 3 [facility] None  01/08/2019  8:45 AM [facility] [place] FLUSH [facility] None  01/08/2019  9:00 AM Truitt Merle, MD [facility] None  01/08/2019  9:30 AM [facility] INFUSION [facility] None  01/10/2019 11:00 AM [facility] [place] FLUSH [facility] None    No orders of the defined types were placed in this encounter.      Subjective:   Patient ID:  Darrell Sutton is a 58 y.o. (DOB 1961-02-23) male.  Chief Complaint: No chief complaint on file.   HPI Darrell Sutton   is a 58 year old male with a diagnosis of a stage IV HER-2/neu positive metastatic adenocarcinoma of the esophagus.  He presents to the clinic today for consideration of cycle 1 of FOLFOX and Herceptin.  He reports having ongoing episodic dysphasia.  He reports that he is able to swallow better at sometimes and not so good at other times.  He is having ongoing constipation.  He reports that he is able to swallow his medications without difficulty.  He denies any other issues of concern today.  Medications: I have reviewed the patient's current medications.  Allergies:  Allergies  Allergen Reactions  . Penicillins Hives    Has patient had a PCN reaction causing immediate rash, facial/tongue/throat swelling, SOB or lightheadedness with hypotension: Yes Has patient had a PCN reaction causing severe rash involving mucus membranes or skin necrosis: No Has patient had a PCN reaction that required hospitalization: No; was already in hosp Has patient had a PCN reaction occurring within the last 10 years: No If all of the above answers are "NO", then may proceed with Cephalosporin use.     Past Medical History:  Diagnosis Date  . Anxiety 2009   started with divorce  .  Cancer [facility])    cancer of lower third of esophagus  . Diabetes mellitus without complication ([place])   . Gastric ulcer    from NSAID overuse  . GERD (gastroesophageal reflux disease)   . Hyperlipidemia   . Hypertension   . MI  (myocardial infarction) ([place]) 2011   has two stents  . Obesity    highest weight in 500's    Past Surgical History:  Procedure Laterality Date  . [place] SURGERY  2010  . core biopsy  10/11/2018   core biopsy of lymph nodes in Interventional Radiology  . GASTRIC BYPASS  11/24/2008  . GASTROJEJUNOSTOMY N/A 10/17/2018   Procedure: LAPAROSCOPIC PLACEMENT OF  LEFT FEEDING JEJUNOSTOMY TUBE;  Surgeon: Michael Boston, MD;  Location: [facility];  Service: General;  Laterality: N/A;  . IR BILIARY STENT(S) EXISTING ACCESS INC DILATION CATH EXCHANGE  12/06/2018  . IR CHOLANGIOGRAM EXISTING TUBE  12/12/2018  . IR ENDOLUMINAL BX OF BILIARY TREE  11/23/2018  . IR ENDOLUMINAL BX OF BILIARY TREE  12/06/2018  . IR EXCHANGE BILIARY DRAIN  11/23/2018  . IR INT EXT BILIARY DRAIN WITH CHOLANGIOGRAM  11/09/2018  . JOINT REPLACEMENT     Bilateral knee  . PORTACATH PLACEMENT Right 10/17/2018   Procedure: INSERTION PORT-A-CATH WITH ULTRASOUND AND FLUORO;  Surgeon: Michael Boston, MD;  Location: [facility];  Service: General;  Laterality: Right;  . TONSILECTOMY/ADENOIDECTOMY WITH MYRINGOTOMY      Family History  Problem Relation Age of Onset  . Hyperlipidemia Mother   . Hypertension Mother   . Hyperlipidemia Father   . Hypertension Father   . Stroke Father   . Cancer Father        NHL  . Diabetes Brother   . Heart attack Brother 110  . Hyperlipidemia Brother   . Hypertension Brother   . Heart attack Maternal Grandmother   . Heart attack Maternal Grandfather   . Early death Maternal Grandfather   . Esophageal cancer Paternal Grandmother   . Colon cancer Paternal Grandfather   . Colon cancer Cousin   . Stomach cancer Neg Hx     Social History   Socioeconomic History  . Marital status: Married    Spouse name: Not on file  . Number of children: 3  . Years of education: Not on file  . Highest education level: Not on file  Occupational History  . Not on file  Social Needs  . Financial resource strain: Not  on file  . Food insecurity:    Worry: Not on file    Inability: Not on file  . Transportation needs:    Medical: No    Non-medical: No  Tobacco Use  . Smoking status: Never Smoker  . Smokeless tobacco: Never Used  Substance and Sexual Activity  . Alcohol use: Never    Frequency: Never  . Drug use: Not Currently    Types: Marijuana    Comment: Youth years- not since earlt 90's  . Sexual activity: Not on file  Lifestyle  . Physical activity:    Days per week: Not on file    Minutes per session: Not on file  . Stress: Not on file  Relationships  . Social connections:    Talks on phone: Not on file    Gets together: Not on file    Attends religious service: Not on file    Active member of club or organization: Not on file    Attends meetings of clubs or organizations: Not on file  Relationship status: Not on file  . Intimate partner violence:    Fear of current or ex partner: Not on file    Emotionally abused: Not on file    Physically abused: Not on file    Forced sexual activity: Not on file  Other Topics Concern  . Not on file  Social History Narrative   From [place], moved here Sep 2019 with his wife and son   Son with autism, starting at [employer]       Past Medical History, Surgical history, Social history, and Family history were reviewed and updated as appropriate.   Please see review of systems for further details on the patient's review from today.   Review of Systems:  Review of Systems  Constitutional: Negative for chills, diaphoresis and fever.  HENT: Positive for trouble swallowing. Negative for voice change.   Respiratory: Negative for cough, chest tightness, shortness of breath and wheezing.   Cardiovascular: Negative for chest pain and palpitations.  Gastrointestinal: Negative for abdominal pain, constipation, diarrhea, nausea and vomiting.  Musculoskeletal: Negative for back pain and myalgias.  Neurological: Negative for dizziness,  light-headedness and headaches.    Objective:   Physical Exam:  BP (!) 152/102   Pulse 97   Temp 98 F (36.7 C) (Oral)   Resp 18   SpO2 100%  ECOG: 1  Physical Exam Constitutional:      General: He is not in acute distress.    Appearance: He is not diaphoretic.  HENT:     Head: Normocephalic and atraumatic.  Cardiovascular:     Rate and Rhythm: Normal rate and regular rhythm.     Heart sounds: Normal heart sounds. No murmur. No friction rub. No gallop.   Pulmonary:     Effort: Pulmonary effort is normal. No respiratory distress.     Breath sounds: Normal breath sounds. No wheezing or rales.  Skin:    General: Skin is warm and dry.     Comments: The patient was noted to have multiple areas of erythema and scaling over his bilateral upper extremities.  Neurological:     Mental Status: He is alert.  Psychiatric:        Mood and Affect: Mood normal.        Behavior: Behavior normal.        Thought Content: Thought content normal.        Judgment: Judgment normal.     Lab Review:     Component Value Date/Time   NA 133 (L) 12/25/2018 0916   K 4.6 12/25/2018 0916   CL 95 (L) 12/25/2018 0916   CO2 29 12/25/2018 0916   GLUCOSE 217 (H) 12/25/2018 0916   BUN 14 12/25/2018 0916   CREATININE 0.80 12/25/2018 0916   CALCIUM 8.7 (L) 12/25/2018 0916   PROT 6.3 (L) 12/25/2018 0916   ALBUMIN 2.5 (L) 12/25/2018 0916   AST 36 12/25/2018 0916   ALT 31 12/25/2018 0916   ALKPHOS 347 (H) 12/25/2018 0916   BILITOT 0.8 12/25/2018 0916   GFRNONAA >60 12/25/2018 0916   GFRAA >60 12/25/2018 0916       Component Value Date/Time   WBC 8.7 12/25/2018 0916   WBC 8.5 12/06/2018 1140   RBC 5.08 12/25/2018 0916   HGB 13.3 12/25/2018 0916   HCT 42.4 12/25/2018 0916   PLT 264 12/25/2018 0916   MCV 83.5 12/25/2018 0916   MCH 26.2 12/25/2018 0916   MCHC 31.4 12/25/2018 0916   RDW 21.7 (H) 12/25/2018 8676  LYMPHSABS 0.2 (L) 12/25/2018 0916   MONOABS 0.6 12/25/2018 0916   EOSABS 0.1  12/25/2018 0916   BASOSABS 0.0 12/25/2018 0916   -------------------------------  Imaging from last 24 hours (if applicable):  Radiology interpretation: Ir Cholangiogram Existing Tube  Result Date: 12/12/2018 INDICATION: Biliary obstruction, status post common bile duct wall flex stent insertion 12/06/2018 EXAM: CHOLANGIOGRAM THROUGH EXISTING RIGHT BILIARY ACCESS REMOVAL OF THE RIGHT INTERNAL EXTERNAL BILIARY DRAIN MEDICATIONS: NONE. ANESTHESIA/SEDATION: None. FLUOROSCOPY TIME:  Fluoroscopy Time: 0 minutes 48 seconds (41 mGy). COMPLICATIONS: None immediate. PROCEDURE: Informed written consent was obtained from the patient after a thorough discussion of the procedural risks, benefits and alternatives. All questions were addressed. Maximal Sterile Barrier Technique was utilized including caps, mask, sterile gowns, sterile gloves, sterile drape, hand hygiene and skin antiseptic. A timeout was performed prior to the initiation of the procedure. Under sterile conditions, the existing right internal external biliary drain was removed over a Bentson guidewire. Eight French sheath inserted above the CBD wall flex stent. Contrast injection performed for cholangiogram over the guidewire. This confirms patency of the CBD stent. Contrast easily passes through the stent into the duodenum. No dilatation or obstruction of the biliary tree centrally. Access removed. IMPRESSION: Widely patent CBD wall flex stent. Right internal external biliary drain removed. Electronically Signed   By: Jerilynn Mages.  Shick M.D.   On: 12/12/2018 14:41   Ir Biliary Stent(s) Existing Access Inc Dilation Cath Exchange  Result Date: 12/06/2018 INDICATION: History of esophageal cancer with concern for malignant biliary obstruction, post image guided biliary drainage catheter placement 11/09/2018. Note, image guided biliary drainage catheter was placed in lieu of endoscopic stenting given patient's history of gastric bypass surgery. Patient subsequent  returned for diagnostic cholangiogram and fluoroscopic guided biliary brush biopsy 11/23/2018, however diagnosis remains indeterminate. Patient's biliary drainage catheter was capped at that time and his bilirubin has continued to normalize. Patient presents today for repeat diagnostic glandular g, biliary brush biopsy as well as attempted placement of a internal biliary stent. EXAM: 1. DIAGNOSTICALLY ANGIOGRAM VIA EXISTING BILIARY DRAINAGE CATHETER 2. FLUOROSCOPIC GUIDED RIGHT-SIDED PERCUTANEOUS BILIARY DRAINAGE CATHETER 3. FLUOROSCOPIC GUIDED BILIARY BRUSH BIOPSY. 4. FLUOROSCOPIC GUIDED BILIARY STENT PLACEMENT. 5. FLUOROSCOPIC GUIDED BILIARY DRAINAGE CATHETER EXCHANGE COMPARISON:  COMPARISON Image guided percutaneous biliary drainage catheter-11/09/2018; diagnostically cholangiogram, biliary brush biopsy and biliary drainage catheter exchange-11/23/2018; MRCP-11/07/2018 CONTRAST:  20 cc Isovue-300, administered into biliary system. FLUOROSCOPY TIME:  12 seconds (34.2 mGy) COMPLICATIONS: None immediate. TECHNIQUE: Informed written consent was obtained from the patient after a discussion of the risks, benefits and alternatives to treatment. Questions regarding the procedure were encouraged and answered. A timeout was performed prior to the initiation of the procedure. The external portion of the existing right-sided percutaneous biliary drainage catheter as well as the surrounding skin were prepped and draped in the usual sterile fashion. A sterile drape was applied covering the operative field. Maximum barrier sterile technique with sterile gowns and gloves were used for the procedure. A timeout was performed prior to the initiation of the procedure. A pre procedural spot fluoroscopic image was obtained after contrast was injected via the existing biliary drainage catheter. The existing biliary drainage catheter was cut and cannulated with a stiff Glidewire wire which was coiled within the proximal small bowel.  Under intermittent fluoroscopic guidance, the existing PBD was exchanged for 9 French radiopaque tip vascular sheath. Diagnostic sheath cholangiogram was performed as multiple spot fluoroscopic and radiographic images were obtained in various obliquities. Next, alongside the Amplatz wire, 3 biliary brush  biopsies were obtained under direct fluoroscopic guidance. Multiple fluoroscopic images were saved for documentation purposes. Next, a Bentson wire was utilized for measurement purposes and a 6 cm, x 1 cm wall flex biliary stent was deployed across the length of persistent irregular narrowing of the CBD. Contrast injection demonstrated appropriate position functionality. Finally, under intermittent fluoroscopic guidance, the vascular sheath was exchanged for a new, slightly smaller 8 French percutaneous biliary drainage catheter with end all ultimately coiled and locked within the duodenum. Contrast injection confirmed appropriate positioning. The external portion of the biliary drainage catheter was secured at the skin entrance site within interrupted suture. Biliary drainage catheter was capped and a dressing was placed. The patient tolerated the procedure well without immediate postprocedural complication. FINDINGS: The existing percutaneous biliary catheter is appropriately positioned and functioning. Diagnostic sheath cholangiogram demonstrates grossly unchanged irregular narrowing of the distal aspect of the CBD which again underwent technically successful biliary brush biopsy. Successful deployment of a wall flex biliary stent across the irregular narrowing of the distal aspect of the CBD with distal end within the cranial aspect of the duodenum and proximal end at the level the biliary hilum. Contrast injection demonstrated appropriate position functionality of the biliary stent with passage of contrast to the level of the duodenum. After fluoroscopic guided exchange, the new, slightly smaller, 8 French  percutaneous biliary drainage catheter is appropriately positioned with end coiled and locked within the duodenum. IMPRESSION: 1. Technically successful fluoroscopic guided biliary brush biopsy of irregular narrowing of the distal aspect of the CBD. 2. Successful fluoroscopic guided biliary stent placement. 3. Successful fluoroscopic guided biliary drainage catheter exchange. PLAN: - Again, the biliary drainage catheter was capped for continued internalization. The patient was instructed to reconnect the biliary drainage catheter to a gravity bag if he were to experience recurrent obstructive symptoms. - Otherwise, the patient will return to the [facility] interventional department on Tuesday 1/28 where a CMP will be drawn. Assuming the patient's LFTs remain stable, the patient will then undergo final diagnostic cholangiogram and fluoroscopic guided biliary drainage catheter removal. The patient and the patient's wife are agreement with the proposed plan of care. Electronically Signed   By: Darrell Mariscal M.D.   On: 12/06/2018 16:40   Ir Endoluminal Bx Of Biliary Tree  Result Date: 12/06/2018 INDICATION: History of esophageal cancer with concern for malignant biliary obstruction, post image guided biliary drainage catheter placement 11/09/2018. Note, image guided biliary drainage catheter was placed in lieu of endoscopic stenting given patient's history of gastric bypass surgery. Patient subsequent returned for diagnostic cholangiogram and fluoroscopic guided biliary brush biopsy 11/23/2018, however diagnosis remains indeterminate. Patient's biliary drainage catheter was capped at that time and his bilirubin has continued to normalize. Patient presents today for repeat diagnostic glandular g, biliary brush biopsy as well as attempted placement of a internal biliary stent. EXAM: 1. DIAGNOSTICALLY ANGIOGRAM VIA EXISTING BILIARY DRAINAGE CATHETER 2. FLUOROSCOPIC GUIDED RIGHT-SIDED PERCUTANEOUS BILIARY DRAINAGE  CATHETER 3. FLUOROSCOPIC GUIDED BILIARY BRUSH BIOPSY. 4. FLUOROSCOPIC GUIDED BILIARY STENT PLACEMENT. 5. FLUOROSCOPIC GUIDED BILIARY DRAINAGE CATHETER EXCHANGE COMPARISON:  COMPARISON Image guided percutaneous biliary drainage catheter-11/09/2018; diagnostically cholangiogram, biliary brush biopsy and biliary drainage catheter exchange-11/23/2018; MRCP-11/07/2018 CONTRAST:  20 cc Isovue-300, administered into biliary system. FLUOROSCOPY TIME:  12 seconds (01.7 mGy) COMPLICATIONS: None immediate. TECHNIQUE: Informed written consent was obtained from the patient after a discussion of the risks, benefits and alternatives to treatment. Questions regarding the procedure were encouraged and answered. A timeout was performed prior to the initiation  of the procedure. The external portion of the existing right-sided percutaneous biliary drainage catheter as well as the surrounding skin were prepped and draped in the usual sterile fashion. A sterile drape was applied covering the operative field. Maximum barrier sterile technique with sterile gowns and gloves were used for the procedure. A timeout was performed prior to the initiation of the procedure. A pre procedural spot fluoroscopic image was obtained after contrast was injected via the existing biliary drainage catheter. The existing biliary drainage catheter was cut and cannulated with a stiff Glidewire wire which was coiled within the proximal small bowel. Under intermittent fluoroscopic guidance, the existing PBD was exchanged for 9 French radiopaque tip vascular sheath. Diagnostic sheath cholangiogram was performed as multiple spot fluoroscopic and radiographic images were obtained in various obliquities. Next, alongside the Amplatz wire, 3 biliary brush biopsies were obtained under direct fluoroscopic guidance. Multiple fluoroscopic images were saved for documentation purposes. Next, a Bentson wire was utilized for measurement purposes and a 6 cm, x 1 cm wall flex  biliary stent was deployed across the length of persistent irregular narrowing of the CBD. Contrast injection demonstrated appropriate position functionality. Finally, under intermittent fluoroscopic guidance, the vascular sheath was exchanged for a new, slightly smaller 8 French percutaneous biliary drainage catheter with end all ultimately coiled and locked within the duodenum. Contrast injection confirmed appropriate positioning. The external portion of the biliary drainage catheter was secured at the skin entrance site within interrupted suture. Biliary drainage catheter was capped and a dressing was placed. The patient tolerated the procedure well without immediate postprocedural complication. FINDINGS: The existing percutaneous biliary catheter is appropriately positioned and functioning. Diagnostic sheath cholangiogram demonstrates grossly unchanged irregular narrowing of the distal aspect of the CBD which again underwent technically successful biliary brush biopsy. Successful deployment of a wall flex biliary stent across the irregular narrowing of the distal aspect of the CBD with distal end within the cranial aspect of the duodenum and proximal end at the level the biliary hilum. Contrast injection demonstrated appropriate position functionality of the biliary stent with passage of contrast to the level of the duodenum. After fluoroscopic guided exchange, the new, slightly smaller, 8 French percutaneous biliary drainage catheter is appropriately positioned with end coiled and locked within the duodenum. IMPRESSION: 1. Technically successful fluoroscopic guided biliary brush biopsy of irregular narrowing of the distal aspect of the CBD. 2. Successful fluoroscopic guided biliary stent placement. 3. Successful fluoroscopic guided biliary drainage catheter exchange. PLAN: - Again, the biliary drainage catheter was capped for continued internalization. The patient was instructed to reconnect the biliary  drainage catheter to a gravity bag if he were to experience recurrent obstructive symptoms. - Otherwise, the patient will return to the [facility] interventional department on Tuesday 1/28 where a CMP will be drawn. Assuming the patient's LFTs remain stable, the patient will then undergo final diagnostic cholangiogram and fluoroscopic guided biliary drainage catheter removal. The patient and the patient's wife are agreement with the proposed plan of care. Electronically Signed   By: Darrell Mariscal M.D.   On: 12/06/2018 16:40        This case was discussed with [name]. She expressed agreement with my management of this patient.

## 2018-12-25 NOTE — Patient Instructions (Signed)
Constipation Management  Magnesium Citrate, drink 1/2 bottle, drink remainder if no bowel movement with 30 to 60 minutes  Or  30 mg (1 tablespoon) of Milk of Magnesia in 8 ounces of prune juice, warm in microwave for 20 seconds    Begin the following after you have had a bowel movement:  Senna-S, 1 to 2 tablets twice daily  MiraLAX 17 grams in 8 ounces of liquids 1 to 2 times daily as needed   Remember to remain well hydrated. Drink, Drink, Drink non-caffeinated beverages.   Adjust these medications based on your response. If your bowel movements become too loose then decrease the amount of Senna-S and/or MiraLAX that you are using. If your bowel movements become too firm or are difficult to pass, the increase the amount of Senna-S and/or MiraLAX that you are using and increase your intake of water.   NEVER, NEVER, NEVER use an enema or suppositories unless your provider has given their approval.                   

## 2018-12-25 NOTE — Progress Notes (Signed)
Went to infusion to speak with patient referred by Rob in pharmacy regarding Herceptin copay assistance.  Advised him I can enroll him online. He verbalized understanding.  Patient has new insurance coverage eff 12/16/18. Gave information to [facility] regarding authorization.

## 2018-12-25 NOTE — Progress Notes (Signed)
Pt tolerated first time Oxaliplatin and Leucovorin treatment with out complaint. All questions answered. Educational material sent home with pt.

## 2018-12-25 NOTE — Progress Notes (Signed)
Enrolled patient in copay assistance for Herceptin through [employer].  Patient approved for a 31-month benefit of up to $25,000 effective 12/25/18, leaving him with a $5 copay per treatment after insurance pays.  Copy given to [facility] for billing/copay monitoring.  Copy given to patient. Copay program will only pay after insurance.

## 2018-12-25 NOTE — Progress Notes (Signed)
Due to pt's long treatment scheduled for today (2/10) PA Lucianne Lei will see pt in infusion.  PA aware, infusion Charge RN aware.

## 2018-12-25 NOTE — Progress Notes (Signed)
Herceptin dose will be adjusted based on today's wt per MD. Kennith Center, Pharm.D., CPP 12/25/2018@11 :54 AM

## 2018-12-26 ENCOUNTER — Other Ambulatory Visit (HOSPITAL_COMMUNITY): Payer: Self-pay

## 2018-12-26 ENCOUNTER — Telehealth: Payer: Self-pay

## 2018-12-26 ENCOUNTER — Other Ambulatory Visit: Payer: Self-pay

## 2018-12-26 DIAGNOSIS — R4702 Dysphasia: Secondary | ICD-10-CM

## 2018-12-26 DIAGNOSIS — R1310 Dysphagia, unspecified: Secondary | ICD-10-CM

## 2018-12-26 NOTE — Telephone Encounter (Signed)
Spoke with patient regarding appointment for Barium Swallow Study, this will be on 01/11/2019 at 11:00 to arrive at 10:30 at [facility], use valet parking, go in main entrance to guest services desk and he will be directed from that point.  No dietary restrictions apply to this test.  Patient verbalized an understanding.

## 2018-12-26 NOTE — Progress Notes (Signed)
S 

## 2018-12-27 ENCOUNTER — Inpatient Hospital Stay: Payer: PRIVATE HEALTH INSURANCE

## 2018-12-27 VITALS — BP 148/76 | HR 82 | Temp 98.0°F | Resp 18

## 2018-12-27 DIAGNOSIS — C155 Malignant neoplasm of lower third of esophagus: Secondary | ICD-10-CM

## 2018-12-27 DIAGNOSIS — Z5112 Encounter for antineoplastic immunotherapy: Secondary | ICD-10-CM | POA: Diagnosis not present

## 2018-12-27 DIAGNOSIS — Z7189 Other specified counseling: Secondary | ICD-10-CM

## 2018-12-27 MED ORDER — SODIUM CHLORIDE 0.9% FLUSH
10.0000 mL | INTRAVENOUS | Status: DC | PRN
  Administered 2018-12-27: 10 mL
  Filled 2018-12-27: qty 10

## 2018-12-27 MED ORDER — HEPARIN SOD (PORK) LOCK FLUSH 100 UNIT/ML IV SOLN
500.0000 [IU] | Freq: Once | INTRAVENOUS | Status: AC | PRN
  Administered 2018-12-27: 500 [IU]
  Filled 2018-12-27: qty 5

## 2018-12-29 ENCOUNTER — Encounter: Payer: Self-pay | Admitting: Hematology

## 2019-01-01 ENCOUNTER — Telehealth: Payer: Self-pay | Admitting: Radiation Oncology

## 2019-01-01 ENCOUNTER — Ambulatory Visit: Payer: PRIVATE HEALTH INSURANCE | Admitting: Radiation Oncology

## 2019-01-01 ENCOUNTER — Encounter: Payer: Self-pay | Admitting: Internal Medicine

## 2019-01-01 NOTE — Telephone Encounter (Signed)
I called to speak with the patient to make sure he knew about his appointment today. I called and he was not aware. He reports he is doing much better with eating and drinking and wishes to cancel his appt. I let him know we'd be happy to see him at a later date if needed or prn per [name]. He is in agreement.

## 2019-01-02 ENCOUNTER — Other Ambulatory Visit: Payer: Self-pay | Admitting: Physician Assistant

## 2019-01-02 MED ORDER — INSULIN NPH (HUMAN) (ISOPHANE) 100 UNIT/ML ~~LOC~~ SUSP: 25.0000 [IU] | Freq: Two times a day (BID) | SUBCUTANEOUS | 2 refills | Status: AC

## 2019-01-02 MED ORDER — INSULIN REGULAR HUMAN 100 UNIT/ML IJ SOLN: INTRAMUSCULAR | 2 refills | Status: AC

## 2019-01-02 NOTE — Telephone Encounter (Signed)
Copied from [place] [id number]. Topic: Quick Communication - Rx Refill/Question >> Jan 02, 2019  2:41 PM Bea Graff, NT wrote: Medication: insulin NPH Human (HUMULIN N,NOVOLIN N) 100 UNIT/ML injection and insulin regular (NOVOLIN R,HUMULIN R) 100 units/mL injection  Was originally ordered from a hospital physician.   Has the patient contacted their pharmacy? Yes.   (Agent: If no, request that the patient contact the pharmacy for the refill.) (Agent: If yes, when and what did the pharmacy advise?)  Preferred Pharmacy (with phone number or street name): [id number] - [place], [place] [phone] (Phone) [phone] (Fax)    Agent: Please be advised that RX refills may take up to 3 business days. We ask that you follow-up with your pharmacy.

## 2019-01-02 NOTE — Telephone Encounter (Signed)
Requested medication (s) are due for refill today:  yes  Requested medication (s) are on the active medication list:  yes  Future visit scheduled:  no  Last Refill: Humulin N: 11/22/18; 10 ml; no refills (ordered upon discharge from hosp.)                    Humulin R: 11/22/18; 10 ml; no refills  (ordered upon discharge from hosp.)  ** Noted that in the referral to Endocrinology, no Medicaid appt. is avail. until July.      Requested Prescriptions  Pending Prescriptions Disp Refills   insulin NPH Human (HUMULIN N,NOVOLIN N) 100 UNIT/ML injection 10 mL 1    Sig: Inject 0.18 mLs (18 Units total) into the skin 2 (two) times daily at 8 am and 10 pm.     Endocrinology:  Diabetes - Insulins Failed - 01/02/2019  2:46 PM      Failed - HBA1C is between 0 and 7.9 and within 180 days    Hgb A1c MFr Bld  Date Value Ref Range Status  10/17/2018 8.4 (H) 4.8 - 5.6 % Final    Comment:    (NOTE) Pre diabetes:          5.7%-6.4% Diabetes:              >6.4% Glycemic control for   <7.0% adults with diabetes          Passed - Valid encounter within last 6 months    Recent Outpatient Visits          4 weeks ago Hypernatremia   [facility] [place], [place], [place]   4 months ago Diabetes mellitus without complication [facility])   [place], [place], [place]   4 months ago Epigastric pain   [facility] [place], [place], [place]            insulin regular (NOVOLIN R,HUMULIN R) 100 units/mL injection 10 mL 0    Sig: For glucose 121 to 150 use 3 units, for 151 to 200 use 4 units, for 201-250 use 7 units, for 251-300 use 11 units, for 301 to 350 use 15 units. FOR 351-400 use 20 units     Endocrinology:  Diabetes - Insulins Failed - 01/02/2019  2:46 PM      Failed - HBA1C is between 0 and 7.9 and within 180 days    Hgb A1c MFr Bld  Date Value Ref Range Status  10/17/2018 8.4 (H) 4.8 - 5.6 % Final    Comment:    (NOTE) Pre diabetes:           5.7%-6.4% Diabetes:              >6.4% Glycemic control for   <7.0% adults with diabetes          Passed - Valid encounter within last 6 months    Recent Outpatient Visits          4 weeks ago Hypernatremia   [facility] [place], [place], [place]   4 months ago Diabetes mellitus without complication [facility])   [place], [place]   4 months ago Epigastric pain   [place] [place], [place], [place]

## 2019-01-02 NOTE — Telephone Encounter (Signed)
See note

## 2019-01-05 NOTE — Progress Notes (Signed)
[place]   [phone] [phone]) [phone]   Clinic Follow up Note   Patient Care Team: [name], [place] as PCP - General (Physician Assistant) [name], MD as PCP - Cardiology (Cardiology) Truitt Merle, MD as Consulting Physician (Medical Oncology) [name] Kirke Corin, MD as Consulting Physician (Gastroenterology) [name], MD as Consulting Physician (Radiation Oncology)  Date of Service:  01/08/2019  CHIEF COMPLAINT: F/u of esophogeal cancer  SUMMARY OF ONCOLOGIC HISTORY: Oncology History   Cancer Staging Cancer of lower third of esophagus [facility]) Staging form: Esophagus - Adenocarcinoma, AJCC 8th Edition - Clinical stage from 10/11/2018: Stage IVB (cTX, cN2, pM1) - Signed by Truitt Merle, MD on 10/17/2018       Cancer of lower third of esophagus ([place])   09/20/2018 Procedure    Upper Endoscopy by Dr. [name] 09/20/18  IMPRESSION - Normal larynx. - Partially obstructing, likely malignant esophageal tumor was found in the lower third of the esophagus. Biopsied. - Gastric bypass with a normal-sized pouch and gastrojejunal anastomosis characterized by ulceration. - Normal examined jejunum.    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    09/26/2018 Initial Diagnosis    Cancer of lower third of esophagus ([place])    09/29/2018 PET scan    PET 09/29/18  IMPRESSION: 1. Focal hypermetabolism in the distal esophagus is consistent with the patient's known malignancy. 2. Areas of ill-defined upper normal to mildly enlarged lymph nodes identified in the left supraclavicular region, left thoracic inlet, upper abdomen, and para-aortic retroperitoneal space. These areas also show stranding around the lymph nodes. Low level FDG accumulation in these lymph  nodes is borderline for infectious versus neoplastic etiology. While metastatic disease is certainly a consideration, infectious/inflammatory etiology is also possible and lymphoma cannot be excluded.    09/30/2018 PET scan    09/30/2018 PET IMPRESSION: 1. Focal hypermetabolism in the distal esophagus is consistent with the patient's known malignancy. 2. Areas of ill-defined upper normal to mildly enlarged lymph nodes identified in the left supraclavicular region, left thoracic inlet, upper abdomen, and para-aortic retroperitoneal space. These areas also show stranding around the lymph nodes. Low level FDG accumulation in these lymph nodes is borderline for infectious versus neoplastic etiology. While metastatic disease is certainly a consideration, infectious/inflammatory etiology is also possible and lymphoma cannot be excluded.     10/11/2018 Imaging    10/11/2018 [place] Core Biopsy FINDINGS: Bilobed abnormal lymph node or adjacent lymph nodes in the left supraclavicular region. This bilobed lymphadenopathy was successfully biopsied. Needle position was confirmed within this lesion.  IMPRESSION: Ultrasound-guided left supraclavicular lymph node biopsy.    10/11/2018 Cancer Staging    Staging form: Esophagus - Adenocarcinoma, AJCC 8th Edition - Clinical stage from 10/11/2018: Stage IVB (cTX, cN2, pM1) - Signed by Truitt Merle, MD on 10/17/2018    10/11/2018 Pathology Results    Left supraclavicular lymph node biopsy showed metastatic adenocarcinoma.     10/23/2018 - 11/09/2018 Radiation Therapy    With Dr. Lisbeth Renshaw    11/07/2018 Imaging    11/07/2018 MRI Abdomen IMPRESSION: 1. Marked narrowing in a 2.3 cm segment of the common bile duct corresponding with the segment of the duct along the pancreas. I do not see a definite pancreatic lesion but there is some faintly accentuated enhancement in the CBD in this vicinity. This  could be from localized inflammation/cholangitis or a  stricture. The CBD proximal to this stricture is 0.8 cm in diameter. Sludge noted in the gallbladder without definite stones. 2. Distal esophageal wall thickening and thickening in the proximal stomach. There are also postoperative findings in the stomach. Surrounding inflammatory stranding is noted around the distal esophagus and proximal stomach, possibly therapy related. Surrounding small lymph nodes noted along with enlarged lymph nodes in the porta hepatis which are nonspecific and could be reactive or malignant. 3. Scarring or atelectasis in the right lower lobe.    11/07/2018 Imaging    11/07/2018 US Abdomen IMPRESSION: 1. Dilatation of the common bile duct and gallbladder without obstructing lesion identified. No gallstones are evident but there is sludge within the lumen gallbladder. Consider contrast MRCP to further evaluate etiology of potential distal biliary obstruction. 2. No intrahepatic biliary duct dilatation identified.    11/07/2018 - 11/11/2018 Hospital Admission    Admit date: 11/07/2018 Admission diagnosis: Obstructive Jaundice Additional comments: PBD placement done    11/09/2018 Procedure    11/09/2018 PBD IMPRESSION: Successful placement of a 10.2 French percutaneous biliary drainage catheter with end coiled and locked within the duodenum.    11/19/2018 - 11/22/2018 Hospital Admission    Admit date: 11/19/2018 Admission diagnosis: Hyperglycemia Additional comments:     12/25/2018 -  Chemotherapy     FOLFOX and Herceptin every 2 weeks starting in 12/2018      CURRENT THERAPY:  FOLFOX and Herceptin every 2 weeks starting 12/25/18    INTERVAL HISTORY:  Darrell Sutton is here for a follow up and chemo. He presents to the clinic today by himself. He notes he has not been able to get much sleep. He sleeps in a chair which can be uncomfortable. He does not lay down in bed due to SOB. He did not have sleep issue prior to cancer diagnosis. He has tried  melatonin with no much relief. He notes he is drinking more liquid.  He notes his first cycle went moderately well. He had nausea and used antiemetics with mild relief. He denies GI issues post treatment. He is on Liquid diet mostly with occasionally soft foods. He notes he lost his voice and it is hoarse. He denies throat pain and is able to keep down his saliva. He denies fever or chills. He notes his weight is 199 lbs today. He is gaining weight back.  I reviewed his medication list. He would like to refill Vicodin. He is taking NORCO 1-2 times a day.      REVIEW OF SYSTEMS:   Constitutional: Denies fevers, chills or abnormal weight loss (+) Trouble sleeping (+) Weight gain.  Eyes: Denies blurriness of vision Ears, nose, mouth, throat, and face: Denies Sore throat (+) Hoarse voice Respiratory: Denies cough, dyspnea or wheezes Cardiovascular: Denies palpitation, chest discomfort or lower extremity swelling Gastrointestinal:  Denies nausea, heartburn or change in bowel habits Skin: Denies abnormal skin rashes Lymphatics: Denies new lymphadenopathy or easy bruising Neurological:Denies numbness, tingling or new weaknesses Behavioral/Psych: Mood is stable, no new changes  All other systems were reviewed with the patient and are negative.  MEDICAL HISTORY:  Past Medical History:  Diagnosis Date  . Anxiety 2009   started with divorce  . Cancer [facility])    cancer of lower third of esophagus  . Diabetes mellitus without complication ([place])   . Gastric ulcer    from NSAID overuse  . GERD (gastroesophageal reflux disease)   . Hyperlipidemia   .  Hypertension   . MI (myocardial infarction) ([place]) 2011   has two stents  . Obesity    highest weight in 500's    SURGICAL HISTORY: Past Surgical History:  Procedure Laterality Date  . [place] SURGERY  2010  . core biopsy  10/11/2018   core biopsy of lymph nodes in Interventional Radiology  . GASTRIC BYPASS  11/24/2008  . GASTROJEJUNOSTOMY  N/A 10/17/2018   Procedure: LAPAROSCOPIC PLACEMENT OF  LEFT FEEDING JEJUNOSTOMY TUBE;  Surgeon: Michael Boston, MD;  Location: [facility];  Service: General;  Laterality: N/A;  . IR BILIARY STENT(S) EXISTING ACCESS INC DILATION CATH EXCHANGE  12/06/2018  . IR CHOLANGIOGRAM EXISTING TUBE  12/12/2018  . IR ENDOLUMINAL BX OF BILIARY TREE  11/23/2018  . IR ENDOLUMINAL BX OF BILIARY TREE  12/06/2018  . IR EXCHANGE BILIARY DRAIN  11/23/2018  . IR INT EXT BILIARY DRAIN WITH CHOLANGIOGRAM  11/09/2018  . JOINT REPLACEMENT     Bilateral knee  . PORTACATH PLACEMENT Right 10/17/2018   Procedure: INSERTION PORT-A-CATH WITH ULTRASOUND AND FLUORO;  Surgeon: Michael Boston, MD;  Location: [facility];  Service: General;  Laterality: Right;  . TONSILECTOMY/ADENOIDECTOMY WITH MYRINGOTOMY      I have reviewed the social history and family history with the patient and they are unchanged from previous note.  ALLERGIES:  is allergic to penicillins.  MEDICATIONS:  Current Outpatient Medications  Medication Sig Dispense Refill  . albuterol (PROVENTIL HFA;VENTOLIN HFA) 108 (90 Base) MCG/ACT inhaler Inhale 2 puffs into the lungs every 6 (six) hours as needed for wheezing or shortness of breath. 1 Inhaler 2  . albuterol (PROVENTIL) (2.5 MG/3ML) 0.083% nebulizer solution Take 3 mLs (2.5 mg total) by nebulization every 6 (six) hours as needed for wheezing or shortness of breath. 75 mL 12  . bisacodyl (DULCOLAX) 10 MG suppository Place 1 suppository (10 mg total) rectally as needed for moderate constipation. 12 suppository 0  . cloNIDine (CATAPRES - DOSED IN MG/24 HR) 0.1 mg/24hr patch Place 1 patch (0.1 mg total) onto the skin once a week. 4 patch 12  . dicyclomine (BENTYL) 10 MG capsule Take 1 capsule (10 mg total) by mouth 4 (four) times daily -  before meals and at bedtime. 30 capsule 1  . HYDROcodone-acetaminophen (NORCO) 7.5-325 MG tablet Take 1 tablet by mouth every 6 (six) hours as needed for moderate pain. 120 tablet 0  .  insulin NPH Human (HUMULIN N,NOVOLIN N) 100 UNIT/ML injection Inject 0.25 mLs (25 Units total) into the skin 2 (two) times daily at 8 am and 10 pm. 10 mL 2  . insulin regular (NOVOLIN R,HUMULIN R) 100 units/mL injection For glucose 121 to 150 use 3 units, for 151 to 200 use 4 units, for 201-250 use 7 units, for 251-300 use 11 units, for 301 to 350 use 15 units. FOR 351-400 use 20 units 10 mL 2  . LORazepam (ATIVAN) 0.5 MG tablet Place 1 tablet (0.5 mg total) under the tongue every 6 (six) hours as needed for anxiety. 40 tablet 2  . metoprolol tartrate (LOPRESSOR) 25 MG tablet Take 0.5 tablets (12.5 mg total) by mouth 2 (two) times daily. 30 tablet 0  . Nutritional Supplements (FEEDING SUPPLEMENT, GLUCERNA 1.5 CAL,) LIQD Place 1,000 mLs into feeding tube continuous. Start @ 65m/hr, increase by 113mevery 8 hours to a goal of 7056mr 1000 mL 30  . ondansetron (ZOFRAN) 4 MG/5ML solution Place 5 mLs (4 mg total) into feeding tube every 8 (eight) hours as needed for  nausea or vomiting. 50 mL 1  . ondansetron (ZOFRAN) 8 MG tablet Take 1 tablet (8 mg total) by mouth every 8 (eight) hours as needed for nausea or vomiting. 30 tablet 1  . Oxycodone HCl 10 MG TABS Take 1 tablet (10 mg total) by mouth every 8 (eight) hours as needed. 10 tablet 0  . PARoxetine (PAXIL) 40 MG tablet Take 1/2 tablet daily po x 1 month. Then increase to 1 tablet daily. 90 tablet 1  . polyethylene glycol (MIRALAX / GLYCOLAX) packet 17 g by Per J Tube route daily as needed for mild constipation. 14 each 0  . prochlorperazine (COMPAZINE) 10 MG tablet Take 1 tablet (10 mg total) by mouth every 6 (six) hours as needed for nausea or vomiting. 30 tablet 2  . scopolamine (TRANSDERM-SCOP) 1 MG/3DAYS Place 1 patch (1.5 mg total) onto the skin every 3 (three) days. 10 patch 5  . Syringe, Disposable, 3 ML MISC Insulin syringes and needles. 100 each 0  . Water For Irrigation, Sterile (FREE WATER) SOLN Place 100 mLs into feeding tube every 4 (four)  hours.     No current facility-administered medications for this visit.    Facility-Administered Medications Ordered in Other Visits  Medication Dose Route Frequency Provider Last Rate Last Dose  . acetaminophen (TYLENOL) tablet 650 mg  650 mg Oral Once Truitt Merle, MD      . diphenhydrAMINE (BENADRYL) capsule 50 mg  50 mg Oral Once Truitt Merle, MD      . fluorouracil (ADRUCIL) 6,200 mg in sodium chloride 0.9 % 126 mL chemo infusion  2,400 mg/m2 (Treatment Plan Recorded) Intravenous 1 day or 1 dose Truitt Merle, MD      . heparin lock flush 100 unit/mL  500 Units Intracatheter Once PRN Truitt Merle, MD      . leucovorin 1,032 mg in dextrose 5 % 250 mL infusion  400 mg/m2 (Treatment Plan Recorded) Intravenous Once Truitt Merle, MD      . oxaliplatin (ELOXATIN) 220 mg in dextrose 5 % 500 mL chemo infusion  85 mg/m2 (Treatment Plan Recorded) Intravenous Once Truitt Merle, MD      . sodium chloride flush (NS) 0.9 % injection 10 mL  10 mL Intracatheter PRN Truitt Merle, MD      . trastuzumab (HERCEPTIN) 483 mg in sodium chloride 0.9 % 250 mL chemo infusion  4 mg/kg (Treatment Plan Recorded) Intravenous Once Truitt Merle, MD        PHYSICAL EXAMINATION: ECOG PERFORMANCE STATUS: 2 - Symptomatic, <50% confined to bed Weight to 70 pounds, blood pressure 160/82, heart rate 91, respiratory to 17, pulse ox 99% on room air GENERAL:alert, no distress and comfortable SKIN: skin color, texture, turgor are normal, no rashes or significant lesions EYES: normal, Conjunctiva are pink and non-injected, sclera clear OROPHARYNX:no exudate, no erythema and lips, buccal mucosa, and tongue normal  NECK: supple, thyroid normal size, non-tender, without nodularity LYMPH:  no palpable lymphadenopathy in the cervical, axillary or inguinal LUNGS: clear to auscultation and percussion with normal breathing effort HEART: regular rate & rhythm and no murmurs and no lower extremity edema ABDOMEN:abdomen soft, non-tender and normal bowel sounds  (+) PEG Tube in place Musculoskeletal:no cyanosis of digits and no clubbing  NEURO: alert & oriented x 3 with fluent speech, no focal motor/sensory deficits  LABORATORY DATA:  I have reviewed the data as listed CBC Latest Ref Rng & Units 01/08/2019 12/25/2018 12/11/2018  WBC 4.0 - 10.5 K/uL 5.3 8.7 5.8  Hemoglobin  13.0 - 17.0 g/dL 12.3(L) 13.3 13.0  Hematocrit 39.0 - 52.0 % 39.2 42.4 42.1  Platelets 150 - 400 K/uL 318 264 237     CMP Latest Ref Rng & Units 01/08/2019 12/25/2018 12/11/2018  Glucose 70 - 99 mg/dL 164(H) 217(H) 84  BUN 6 - 20 mg/dL _0 Creatinine 0.61 - 1.24 mg/dL 0.66 0.80 0.70  Sodium 135 - 145 mmol/L 136 133(L) 130(L)  Potassium 3.5 - 5.1 mmol/L 4.1 4.6 4.1  Chloride 98 - 111 mmol/L 97(L) 95(L) 92(L)  CO2 22 - 32 mmol/L 30 29 32  Calcium 8.9 - 10.3 mg/dL 8.7(L) 8.7(L) 8.4(L)  Total Protein 6.5 - 8.1 g/dL 6.3(L) 6.3(L) 6.3(L)  Total Bilirubin 0.3 - 1.2 mg/dL 0.7 0.8 0.8  Alkaline Phos 38 - 126 U/L 169(H) 347(H) 608(H)  AST 15 - 41 U/L 25 36 53(H)  ALT 0 - 44 U/L 20 31 48(H)      RADIOGRAPHIC STUDIES: I have personally reviewed the radiological images as listed and agreed with the findings in the report. No results found.   ASSESSMENT & PLAN:  Darrell Sutton is a 58 y.o. male with   1.Metastatic esophageal adenocarcinomato nodes, stage IV, HER2(+), MSS  -Diagnosed in 09/2018.Due to significant dysphagia, he was initially treated withpalliativeradiationfirst. He is also on PEG tube feeding due to significant dysphagia.  -he developed obstructive jaundice after radiation, status post percutaneous biliary draining tube twice, and had biliary stent placed last week. He is recovering well  -His dysphagia has gradually improved, he is able to tolerate liquid and some soft foods now.  -Given his HER2 positive tumor, he started treatment with first line FOLFOX and Herceptin every 2 weeks on 12/25/18  -He tolerated first cycle moderately well with  -Labs  reviewed, Hg at 12.3, BG at 164, Ca at 8.7, Albumin at 2.4, Alk phos at 169. Overall adequate to proceed with FOLFOX and Herceptin today  -f/u in 2 weeks -I will check with pathology lab about his PD-L1 test result  2.Transaminitis and hyperbilirubinemia -He developed obstructive jaundice lately, status post percutaneous biliary drainage tube placement, brushing was negative for malignant cells.No liver mets on CT  -Etiology unclear,possiblerelated to regional metastatic adenopathy  -He has bilateral percutaneous biliary draining tube has been capped, and had stent placed on 12/06/18.  -Alk Phos at 169 today (01/08/19)   3. Type 2 DMwith uncontrolled hyperglycemia -Currently on insulin. Continue and f/u with dietician. -On 11/28/18 I reviewed his BG log and discussed close monitoring of his BG at home and advised him to increase his NPH insulinfrom 18u to 25u and continue ISS.  -BG today is 164 today (01/08/19) -He will see endocrinologist Dr. Loanne Drilling soon  4.Hypertension -Currently on liquid medications, continue to monitor  5. Insomnia -He has had no improvement in sleep.  -He has tried Melatonin and benadryl without much relief.  -I reviewed sleep hygiene with him  6. Nauseaand abdominal cramps -Currently useszofran,I called in Compazine and Bentyl. He prefers Zofran, I refilled today  -He was not able to tolerate oxycodone. He will continue hydrocodone  -His pain is very well controlled and much improved. He only take Hydrocodone 1-2 times a day now.   7. History of gastric by-pass surgery  8. Financial Support -He is out of work -His insurance status is pending.  -He is currently getting financial help from his father-in-law for now -I will send him to our financial office to see if he is eligible for grant  9. Low appetite, dysphagia, odynophagia weight loss  -Secondary to cancer   -He has PEG tube in place and continues to tube feed -His dysphagia  much improved.  -He will continue to followup with Dietician Ernestene Kiel. I will contact her about swallow teasing.  -Continue drinking Ensure 5-6 times a day -He remains on mostly liquid diet with occasional soft foods. He has been able to gain weight lately.  -His odynophagia is also much improved, he is currently on Norco 7.5/325 mg 1 to 2 tablets/day, takes as needed.  Since his odynophagia and RUQ abdominal pain have improved, we discussed using less narcotics, and will change Norco to 5/325 on next refill if needed   10. Goal of care discussion  -The patient understands the goal of care is palliative. -He is full code now    Plan -Labs reviewed and adequate to proceed with cycle 2 FOLFOX and Herceptin today and continue very 2 weeks  -Lab, slush, F/u and FOLFOX/Herceptin in 2 weeks    No problem-specific Assessment & Plan notes found for this encounter.   No orders of the defined types were placed in this encounter.  All questions were answered. The patient knows to call the clinic with any problems, questions or concerns. No barriers to learning was detected. I spent 20 minutes counseling the patient face to face. The total time spent in the appointment was 25 minutes and more than 50% was on counseling and review of test results     Truitt Merle, MD 01/08/2019   I, Joslyn Devon, am acting as scribe for Truitt Merle, MD.   I have reviewed the above documentation for accuracy and completeness, and I agree with the above.

## 2019-01-08 ENCOUNTER — Telehealth: Payer: Self-pay | Admitting: Hematology

## 2019-01-08 ENCOUNTER — Encounter: Payer: Self-pay | Admitting: Hematology

## 2019-01-08 ENCOUNTER — Inpatient Hospital Stay: Payer: PRIVATE HEALTH INSURANCE

## 2019-01-08 ENCOUNTER — Inpatient Hospital Stay (HOSPITAL_BASED_OUTPATIENT_CLINIC_OR_DEPARTMENT_OTHER): Payer: PRIVATE HEALTH INSURANCE | Admitting: Hematology

## 2019-01-08 VITALS — BP 160/82 | HR 91 | Resp 17 | Ht 72.0 in | Wt 270.5 lb

## 2019-01-08 DIAGNOSIS — Z79899 Other long term (current) drug therapy: Secondary | ICD-10-CM

## 2019-01-08 DIAGNOSIS — Z931 Gastrostomy status: Secondary | ICD-10-CM

## 2019-01-08 DIAGNOSIS — I252 Old myocardial infarction: Secondary | ICD-10-CM

## 2019-01-08 DIAGNOSIS — C155 Malignant neoplasm of lower third of esophagus: Secondary | ICD-10-CM

## 2019-01-08 DIAGNOSIS — G4700 Insomnia, unspecified: Secondary | ICD-10-CM | POA: Diagnosis not present

## 2019-01-08 DIAGNOSIS — I2510 Atherosclerotic heart disease of native coronary artery without angina pectoris: Secondary | ICD-10-CM

## 2019-01-08 DIAGNOSIS — K5900 Constipation, unspecified: Secondary | ICD-10-CM

## 2019-01-08 DIAGNOSIS — Z7189 Other specified counseling: Secondary | ICD-10-CM

## 2019-01-08 DIAGNOSIS — Z9884 Bariatric surgery status: Secondary | ICD-10-CM

## 2019-01-08 DIAGNOSIS — Z95828 Presence of other vascular implants and grafts: Secondary | ICD-10-CM

## 2019-01-08 DIAGNOSIS — E119 Type 2 diabetes mellitus without complications: Secondary | ICD-10-CM | POA: Diagnosis not present

## 2019-01-08 DIAGNOSIS — Z5112 Encounter for antineoplastic immunotherapy: Secondary | ICD-10-CM | POA: Diagnosis not present

## 2019-01-08 DIAGNOSIS — Z7984 Long term (current) use of oral hypoglycemic drugs: Secondary | ICD-10-CM

## 2019-01-08 DIAGNOSIS — I10 Essential (primary) hypertension: Secondary | ICD-10-CM

## 2019-01-08 LAB — CBC WITH DIFFERENTIAL (CANCER CENTER ONLY)
Abs Immature Granulocytes: 0.02 10*3/uL (ref 0.00–0.07)
Basophils Absolute: 0 10*3/uL (ref 0.0–0.1)
Basophils Relative: 0 %
Eosinophils Absolute: 0.1 10*3/uL (ref 0.0–0.5)
Eosinophils Relative: 1 %
HCT: 39.2 % (ref 39.0–52.0)
Hemoglobin: 12.3 g/dL — ABNORMAL LOW (ref 13.0–17.0)
Immature Granulocytes: 0 %
LYMPHS PCT: 3 %
Lymphs Abs: 0.2 10*3/uL — ABNORMAL LOW (ref 0.7–4.0)
MCH: 26.5 pg (ref 26.0–34.0)
MCHC: 31.4 g/dL (ref 30.0–36.0)
MCV: 84.5 fL (ref 80.0–100.0)
Monocytes Absolute: 0.7 10*3/uL (ref 0.1–1.0)
Monocytes Relative: 14 %
Neutro Abs: 4.3 10*3/uL (ref 1.7–7.7)
Neutrophils Relative %: 82 %
Platelet Count: 318 10*3/uL (ref 150–400)
RBC: 4.64 MIL/uL (ref 4.22–5.81)
RDW: 20 % — ABNORMAL HIGH (ref 11.5–15.5)
WBC Count: 5.3 10*3/uL (ref 4.0–10.5)
nRBC: 0 % (ref 0.0–0.2)

## 2019-01-08 LAB — CMP (CANCER CENTER ONLY)
ALT: 20 U/L (ref 0–44)
AST: 25 U/L (ref 15–41)
Albumin: 2.4 g/dL — ABNORMAL LOW (ref 3.5–5.0)
Alkaline Phosphatase: 169 U/L — ABNORMAL HIGH (ref 38–126)
Anion gap: 9 (ref 5–15)
BUN: 9 mg/dL (ref 6–20)
CHLORIDE: 97 mmol/L — AB (ref 98–111)
CO2: 30 mmol/L (ref 22–32)
Calcium: 8.7 mg/dL — ABNORMAL LOW (ref 8.9–10.3)
Creatinine: 0.66 mg/dL (ref 0.61–1.24)
GFR, Est AFR Am: >60 mL/min (ref >60)
GFR, Estimated: >60 mL/min (ref >60)
Glucose, Bld: 164 mg/dL — ABNORMAL HIGH (ref 70–99)
Potassium: 4.1 mmol/L (ref 3.5–5.1)
Sodium: 136 mmol/L (ref 135–145)
Total Bilirubin: 0.7 mg/dL (ref 0.3–1.2)
Total Protein: 6.3 g/dL — ABNORMAL LOW (ref 6.5–8.1)

## 2019-01-08 MED ORDER — SODIUM CHLORIDE 0.9% FLUSH
10.0000 mL | INTRAVENOUS | Status: DC | PRN
  Filled 2019-01-08: qty 10

## 2019-01-08 MED ORDER — DEXTROSE 5 % IV SOLN
Freq: Once | INTRAVENOUS | Status: AC
  Administered 2019-01-08: 11:00:00 via INTRAVENOUS
  Filled 2019-01-08: qty 250

## 2019-01-08 MED ORDER — LEUCOVORIN CALCIUM INJECTION 350 MG
400.0000 mg/m2 | Freq: Once | INTRAVENOUS | Status: AC
  Administered 2019-01-08: 1032 mg via INTRAVENOUS
  Filled 2019-01-08: qty 51.6

## 2019-01-08 MED ORDER — SODIUM CHLORIDE 0.9 % IV SOLN
2400.0000 mg/m2 | INTRAVENOUS | Status: DC
  Administered 2019-01-08: 6200 mg via INTRAVENOUS
  Filled 2019-01-08: qty 124

## 2019-01-08 MED ORDER — TRASTUZUMAB CHEMO 150 MG IV SOLR
4.0000 mg/kg | Freq: Once | INTRAVENOUS | Status: AC
  Administered 2019-01-08: 483 mg via INTRAVENOUS
  Filled 2019-01-08: qty 23

## 2019-01-08 MED ORDER — DIPHENHYDRAMINE HCL 25 MG PO CAPS
50.0000 mg | ORAL_CAPSULE | Freq: Once | ORAL | Status: AC
  Administered 2019-01-08: 50 mg via ORAL

## 2019-01-08 MED ORDER — OXALIPLATIN CHEMO INJECTION 100 MG/20ML
85.0000 mg/m2 | Freq: Once | INTRAVENOUS | Status: AC
  Administered 2019-01-08: 220 mg via INTRAVENOUS
  Filled 2019-01-08: qty 40

## 2019-01-08 MED ORDER — PALONOSETRON HCL INJECTION 0.25 MG/5ML
0.2500 mg | Freq: Once | INTRAVENOUS | Status: AC
  Administered 2019-01-08: 0.25 mg via INTRAVENOUS

## 2019-01-08 MED ORDER — ACETAMINOPHEN 325 MG PO TABS
650.0000 mg | ORAL_TABLET | Freq: Once | ORAL | Status: AC
  Administered 2019-01-08: 650 mg via ORAL

## 2019-01-08 MED ORDER — SODIUM CHLORIDE 0.9 % IV SOLN
Freq: Once | INTRAVENOUS | Status: AC
  Administered 2019-01-08: 12:00:00 via INTRAVENOUS
  Filled 2019-01-08: qty 250

## 2019-01-08 MED ORDER — PALONOSETRON HCL INJECTION 0.25 MG/5ML
INTRAVENOUS | Status: AC
  Filled 2019-01-08: qty 5

## 2019-01-08 MED ORDER — DEXAMETHASONE SODIUM PHOSPHATE 10 MG/ML IJ SOLN
INTRAMUSCULAR | Status: AC
  Filled 2019-01-08: qty 1

## 2019-01-08 MED ORDER — HEPARIN SOD (PORK) LOCK FLUSH 100 UNIT/ML IV SOLN
500.0000 [IU] | Freq: Once | INTRAVENOUS | Status: DC | PRN
  Filled 2019-01-08: qty 5

## 2019-01-08 MED ORDER — ACETAMINOPHEN 325 MG PO TABS
ORAL_TABLET | ORAL | Status: AC
  Filled 2019-01-08: qty 2

## 2019-01-08 MED ORDER — SODIUM CHLORIDE 0.9% FLUSH
10.0000 mL | Freq: Once | INTRAVENOUS | Status: AC
  Administered 2019-01-08: 10 mL
  Filled 2019-01-08: qty 10

## 2019-01-08 MED ORDER — DIPHENHYDRAMINE HCL 25 MG PO CAPS
ORAL_CAPSULE | ORAL | Status: AC
  Filled 2019-01-08: qty 2

## 2019-01-08 MED ORDER — DEXAMETHASONE SODIUM PHOSPHATE 10 MG/ML IJ SOLN
10.0000 mg | Freq: Once | INTRAMUSCULAR | Status: AC
  Administered 2019-01-08: 10 mg via INTRAVENOUS

## 2019-01-08 NOTE — Telephone Encounter (Signed)
No los per 2/24. 

## 2019-01-08 NOTE — Patient Instructions (Signed)
[facility] Discharge Instructions for Patients Receiving Chemotherapy  Today you received the following chemotherapy agents folfox  To help prevent nausea and vomiting after your treatment, we encourage you to take your nausea medication as directed  If you develop nausea and vomiting that is not controlled by your nausea medication, call the clinic.   BELOW ARE SYMPTOMS THAT SHOULD BE REPORTED IMMEDIATELY:  *FEVER GREATER THAN 100.5 F  *CHILLS WITH OR WITHOUT FEVER  NAUSEA AND VOMITING THAT IS NOT CONTROLLED WITH YOUR NAUSEA MEDICATION  *UNUSUAL SHORTNESS OF BREATH  *UNUSUAL BRUISING OR BLEEDING  TENDERNESS IN MOUTH AND THROAT WITH OR WITHOUT PRESENCE OF ULCERS  *URINARY PROBLEMS  *BOWEL PROBLEMS  UNUSUAL RASH Items with * indicate a potential emergency and should be followed up as soon as possible.  Feel free to call the clinic should you have any questions or concerns. The clinic phone number is [phone].  Please show the [facility] at check-in to the Emergency Department and triage nurse.

## 2019-01-09 ENCOUNTER — Other Ambulatory Visit (HOSPITAL_COMMUNITY)
Admission: RE | Admit: 2019-01-09 | Discharge: 2019-01-09 | Disposition: A | Discharge status: Home / Self Care | Payer: PRIVATE HEALTH INSURANCE | Source: Ambulatory Visit | Attending: Hematology | Admitting: Hematology

## 2019-01-09 DIAGNOSIS — C155 Malignant neoplasm of lower third of esophagus: Secondary | ICD-10-CM | POA: Insufficient documentation

## 2019-01-10 ENCOUNTER — Encounter (HOSPITAL_COMMUNITY): Payer: PRIVATE HEALTH INSURANCE

## 2019-01-10 ENCOUNTER — Inpatient Hospital Stay: Payer: PRIVATE HEALTH INSURANCE

## 2019-01-10 VITALS — BP 148/72 | HR 82 | Temp 98.2°F | Resp 17

## 2019-01-10 DIAGNOSIS — Z7189 Other specified counseling: Secondary | ICD-10-CM

## 2019-01-10 DIAGNOSIS — Z5112 Encounter for antineoplastic immunotherapy: Secondary | ICD-10-CM | POA: Diagnosis not present

## 2019-01-10 DIAGNOSIS — C155 Malignant neoplasm of lower third of esophagus: Secondary | ICD-10-CM

## 2019-01-10 MED ORDER — SODIUM CHLORIDE 0.9% FLUSH
10.0000 mL | INTRAVENOUS | Status: DC | PRN
  Administered 2019-01-10: 10 mL
  Filled 2019-01-10: qty 10

## 2019-01-10 MED ORDER — HEPARIN SOD (PORK) LOCK FLUSH 100 UNIT/ML IV SOLN
500.0000 [IU] | Freq: Once | INTRAVENOUS | Status: AC | PRN
  Administered 2019-01-10: 500 [IU]
  Filled 2019-01-10: qty 5

## 2019-01-11 ENCOUNTER — Encounter (HOSPITAL_COMMUNITY): Payer: PRIVATE HEALTH INSURANCE

## 2019-01-11 ENCOUNTER — Ambulatory Visit (HOSPITAL_COMMUNITY): Payer: PRIVATE HEALTH INSURANCE

## 2019-01-14 DEATH — deceased

## 2019-01-16 ENCOUNTER — Encounter (HOSPITAL_COMMUNITY): Payer: PRIVATE HEALTH INSURANCE

## 2019-01-16 ENCOUNTER — Ambulatory Visit (HOSPITAL_COMMUNITY): Payer: PRIVATE HEALTH INSURANCE

## 2019-01-18 ENCOUNTER — Encounter: Payer: Self-pay | Admitting: Hematology

## 2019-01-22 ENCOUNTER — Inpatient Hospital Stay: Payer: PRIVATE HEALTH INSURANCE

## 2019-01-22 ENCOUNTER — Other Ambulatory Visit: Payer: PRIVATE HEALTH INSURANCE

## 2019-01-22 ENCOUNTER — Encounter: Payer: PRIVATE HEALTH INSURANCE | Admitting: Nutrition

## 2019-01-22 ENCOUNTER — Ambulatory Visit: Payer: PRIVATE HEALTH INSURANCE | Admitting: Hematology

## 2019-02-05 ENCOUNTER — Ambulatory Visit: Payer: PRIVATE HEALTH INSURANCE | Admitting: Hematology

## 2019-02-05 ENCOUNTER — Other Ambulatory Visit: Payer: PRIVATE HEALTH INSURANCE

## 2019-02-05 ENCOUNTER — Ambulatory Visit: Payer: PRIVATE HEALTH INSURANCE

## 2019-02-19 ENCOUNTER — Other Ambulatory Visit: Payer: PRIVATE HEALTH INSURANCE

## 2019-02-19 ENCOUNTER — Ambulatory Visit: Payer: PRIVATE HEALTH INSURANCE | Admitting: Hematology

## 2019-02-19 ENCOUNTER — Ambulatory Visit: Payer: PRIVATE HEALTH INSURANCE

## 2019-03-05 ENCOUNTER — Ambulatory Visit: Payer: PRIVATE HEALTH INSURANCE | Admitting: Hematology

## 2019-03-05 ENCOUNTER — Other Ambulatory Visit: Payer: PRIVATE HEALTH INSURANCE

## 2019-03-05 ENCOUNTER — Ambulatory Visit: Payer: PRIVATE HEALTH INSURANCE

## 2019-11-13 IMAGING — XA IR BILIARY STENT EXISTING ACCESSINC DILATION, CATH EXCHANGE
11 of 15 series · 12 of 24 positions shown · non-contrast
Comparison: COMPARISON
Image guided percutaneous biliary drainage catheter-11/09/2018;

INDICATION: History of esophageal cancer with concern for malignant biliary
obstruction, post image guided biliary drainage catheter placement
11/09/2018. Note, image guided biliary drainage catheter was placed
in lieu of endoscopic stenting given patient's history of gastric
bypass surgery.
TECHNIQUE: Informed written consent was obtained from the patient after a
discussion of the risks, benefits and alternatives to treatment.
Questions regarding the procedure were encouraged and answered. A
timeout was performed prior to the initiation of the procedure.

[Series 7: fl - angio · 1 of 144 frames shown (1 of 11)]
[frame 38/144]
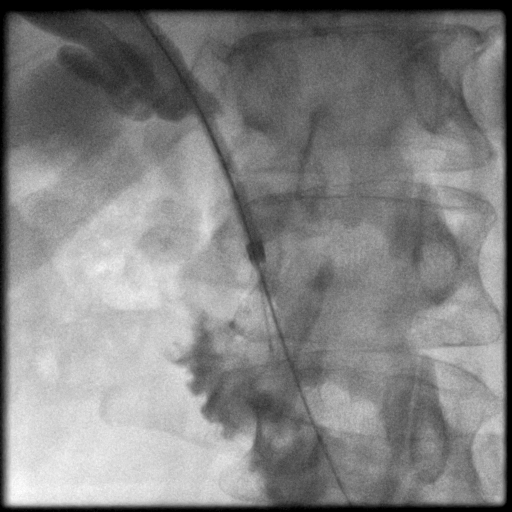

[Series 8: fl - angio · 1 of 126 frames shown (2 of 11)]
[frame 99/126]
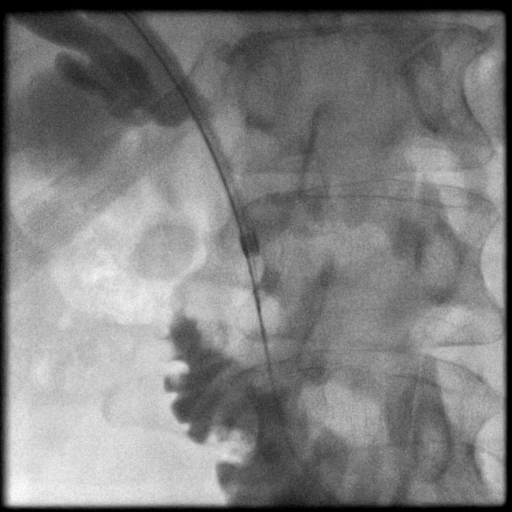

[Series 9: fl - angio · 1 of 156 frames shown (3 of 11)]
[frame 133/156]
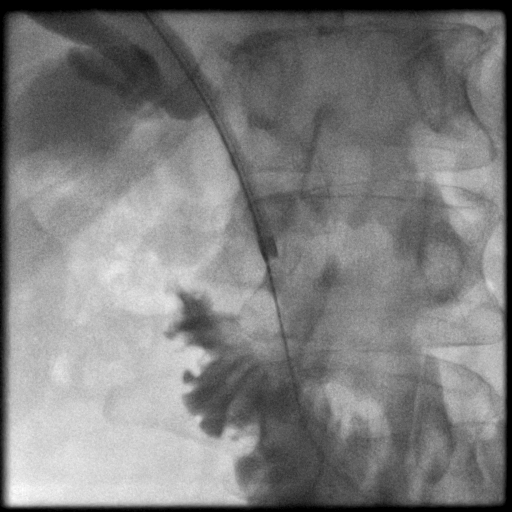

[Series 11: fl - angio · 1 of 52 frames shown (4 of 11)]
[frame 8/52]
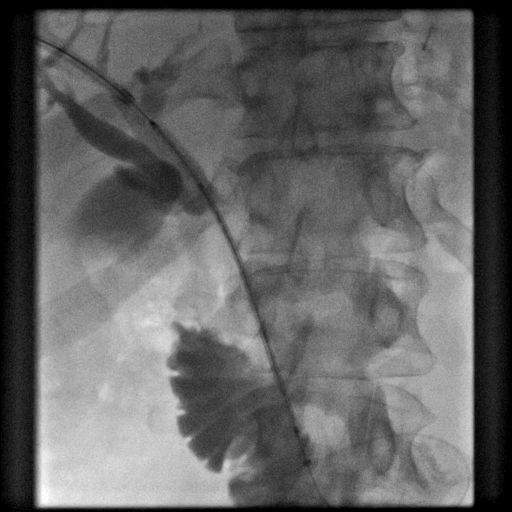

[Series 12: fl - angio · 1 of 246 frames shown (5 of 11)]
[frame 124/246]
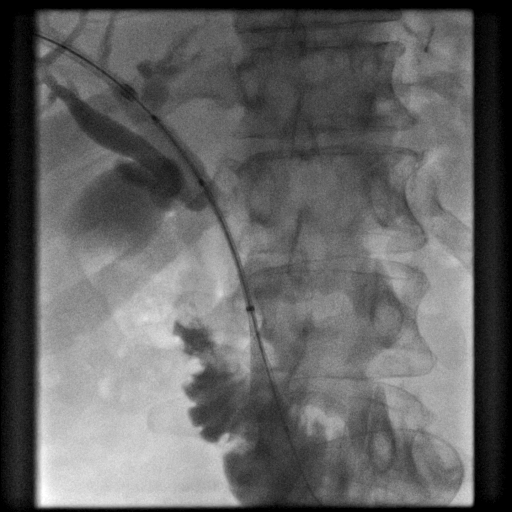

[Series 13: fl - angio · 1 of 34 frames shown (6 of 11)]
[frame 29/34]
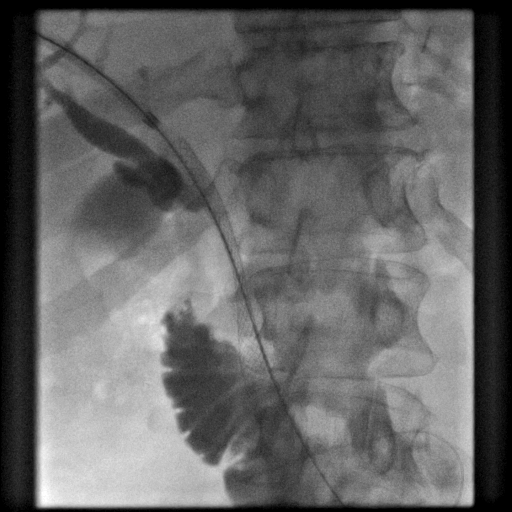

[Series 15: fl - angio · 1 of 34 frames shown (7 of 11)]
[frame 6/34]
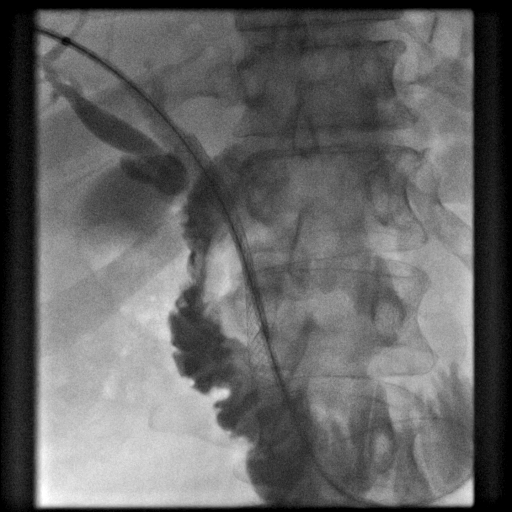

[Series 16: fl - angio · 1 of 40 frames shown (8 of 11)]
[frame 21/40]
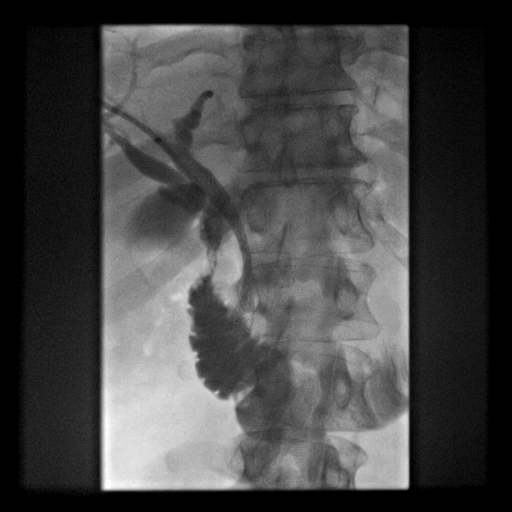

[Series 19: fl - angio · 1 of 73 frames shown (9 of 11)]
[frame 11/73]
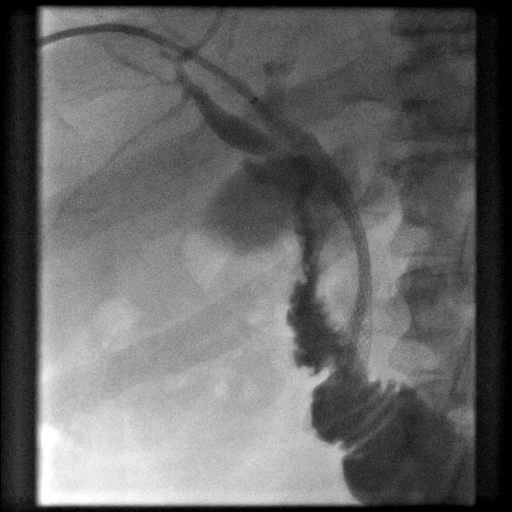

[Series 20: fl - angio · 1 of 71 frames shown (10 of 11)]
[frame 36/71]
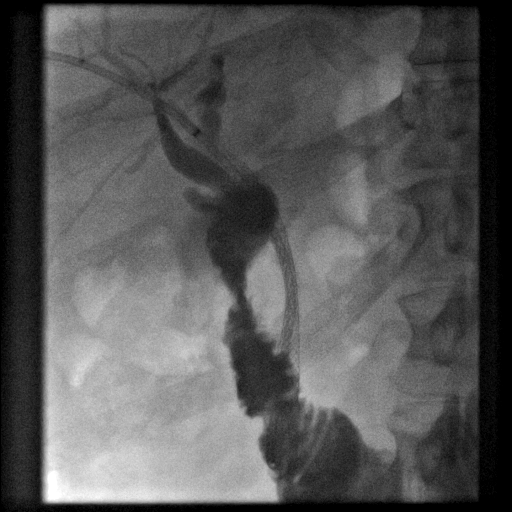

[Series 300: fl - angio · 2 of 10 slices shown (11 of 11)]
[im 4/10]
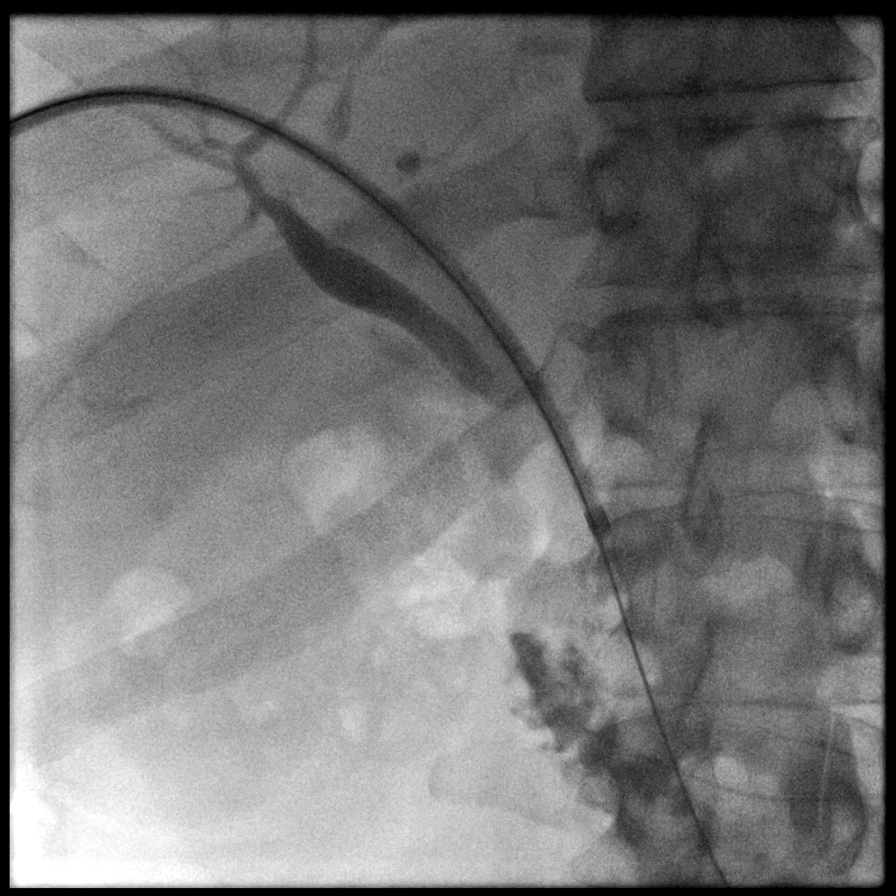
[im 10/10]
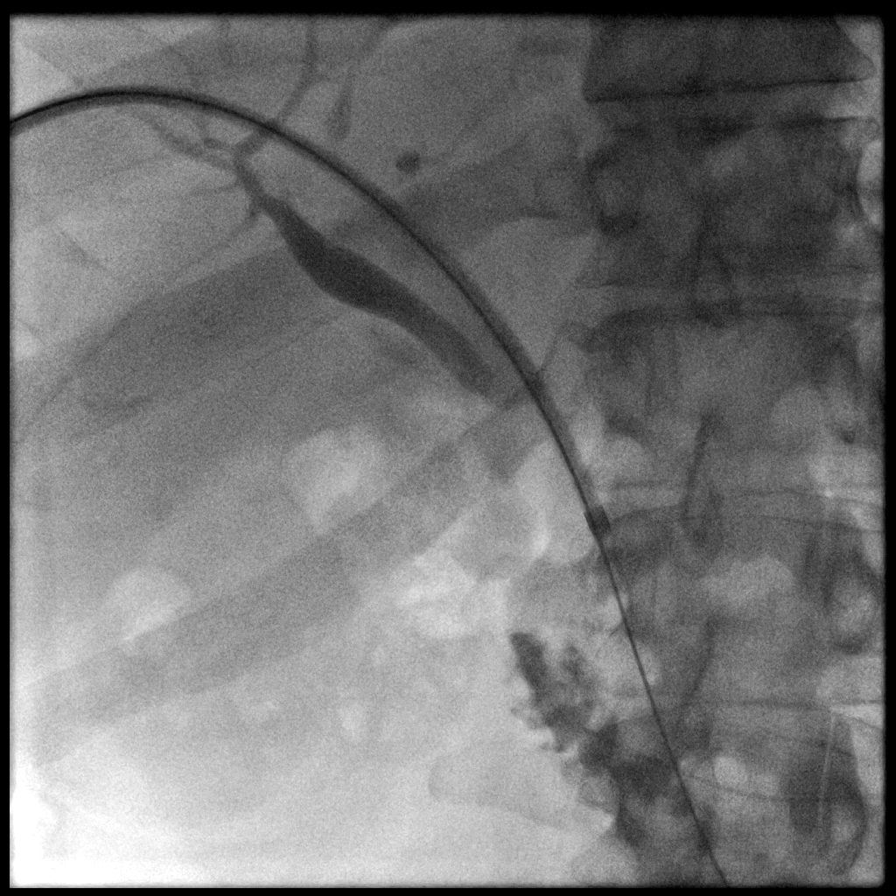

[12 of 24 positions shown; findings below may reference images not displayed]

Patient subsequent returned for diagnostic cholangiogram and
fluoroscopic guided biliary brush biopsy 11/23/2018, however
diagnosis remains indeterminate. Patient's biliary drainage catheter
was capped at that time and his bilirubin has continued to
normalize.

Patient presents today for repeat diagnostic glandular g, biliary
brush biopsy as well as attempted placement of a internal biliary
stent.

EXAM:
1. DIAGNOSTICALLY ANGIOGRAM VIA EXISTING BILIARY DRAINAGE CATHETER
2. FLUOROSCOPIC GUIDED RIGHT-SIDED PERCUTANEOUS BILIARY DRAINAGE
CATHETER
3. FLUOROSCOPIC GUIDED BILIARY BRUSH BIOPSY.
4. FLUOROSCOPIC GUIDED BILIARY STENT PLACEMENT.
5. FLUOROSCOPIC GUIDED BILIARY DRAINAGE CATHETER EXCHANGE
diagnostically cholangiogram, biliary brush biopsy and biliary
drainage catheter exchange-11/23/2018; [facility]-11/07/2018

CONTRAST:  20 cc [id number], administered into biliary system.

FLUOROSCOPY TIME:  12 seconds (12.5 mGy)

COMPLICATIONS:
None immediate.
The external portion of the existing right-sided percutaneous
biliary drainage catheter as well as the surrounding skin were
prepped and draped in the usual sterile fashion. A sterile drape was
applied covering the operative field. Maximum barrier sterile
technique with sterile gowns and gloves were used for the procedure.
A timeout was performed prior to the initiation of the procedure.

A pre procedural spot fluoroscopic image was obtained after contrast
was injected via the existing biliary drainage catheter. The
existing biliary drainage catheter was cut and cannulated with a
stiff Glidewire wire which was coiled within the proximal small
bowel. Under intermittent fluoroscopic guidance, the existing PBD
was exchanged for 9 French radiopaque tip vascular sheath.

Diagnostic sheath cholangiogram was performed as multiple spot
fluoroscopic and radiographic images were obtained in various
obliquities.

Next, alongside the Amplatz wire, 3 biliary brush biopsies were
obtained under direct fluoroscopic guidance. Multiple fluoroscopic
images were saved for documentation purposes.

Next, a Bentson wire was utilized for measurement purposes and a 6
cm, x 1 cm wall flex biliary stent was deployed across the length of
persistent irregular narrowing of the CBD. Contrast injection
demonstrated appropriate position functionality.

Finally, under intermittent fluoroscopic guidance, the vascular
sheath was exchanged for a new, slightly smaller 8 French
percutaneous biliary drainage catheter with end all ultimately
coiled and locked within the duodenum. Contrast injection confirmed
appropriate positioning.

The external portion of the biliary drainage catheter was secured at
the skin entrance site within interrupted suture. Biliary drainage
catheter was capped and a dressing was placed.

The patient tolerated the procedure well without immediate
postprocedural complication.
FINDINGS: The existing percutaneous biliary catheter is appropriately
positioned and functioning.

Diagnostic sheath cholangiogram demonstrates grossly unchanged
irregular narrowing of the distal aspect of the CBD which again
underwent technically successful biliary brush biopsy.

Successful deployment of a wall flex biliary stent across the
irregular narrowing of the distal aspect of the CBD with distal end
within the cranial aspect of the duodenum and proximal end at the
level the biliary hilum. Contrast injection demonstrated appropriate
position functionality of the biliary stent with passage of contrast
to the level of the duodenum.

After fluoroscopic guided exchange, the new, slightly smaller, 8
French percutaneous biliary drainage catheter is appropriately
positioned with end coiled and locked within the duodenum.
IMPRESSION: 1. Technically successful fluoroscopic guided biliary brush biopsy
of irregular narrowing of the distal aspect of the CBD.
2. Successful fluoroscopic guided biliary stent placement.
3. Successful fluoroscopic guided biliary drainage catheter
exchange.

PLAN:
- Again, the biliary drainage catheter was capped for continued
internalization. The patient was instructed to reconnect the biliary
drainage catheter to a gravity bag if he were to experience
recurrent obstructive symptoms.

- Otherwise, the patient will return to the [HOSPITAL]
interventional department on [REDACTED] [DATE] where a CMP will be drawn.
Assuming the patient's LFTs remain stable, the patient will then
undergo final diagnostic cholangiogram and fluoroscopic guided
biliary drainage catheter removal.

The patient and the patient's wife are agreement with the proposed
plan of care.
